# Patient Record
Sex: Male | Born: 1950 | Race: White | Hispanic: No | Smoking: Current every day smoker
Health system: Southern US, Community
[De-identification: ages and names within clinical notes are randomized; demographics above are authoritative.]

## PROBLEM LIST (undated history)

## (undated) DIAGNOSIS — I739 Peripheral vascular disease, unspecified: Secondary | ICD-10-CM

## (undated) DIAGNOSIS — I714 Abdominal aortic aneurysm, without rupture, unspecified: Secondary | ICD-10-CM

## (undated) DIAGNOSIS — Z720 Tobacco use: Secondary | ICD-10-CM

## (undated) DIAGNOSIS — I480 Paroxysmal atrial fibrillation: Secondary | ICD-10-CM

## (undated) DIAGNOSIS — I441 Atrioventricular block, second degree: Secondary | ICD-10-CM

## (undated) DIAGNOSIS — E669 Obesity, unspecified: Secondary | ICD-10-CM

## (undated) DIAGNOSIS — G4733 Obstructive sleep apnea (adult) (pediatric): Secondary | ICD-10-CM

## (undated) DIAGNOSIS — G9389 Other specified disorders of brain: Secondary | ICD-10-CM

## (undated) DIAGNOSIS — I10 Essential (primary) hypertension: Secondary | ICD-10-CM

## (undated) DIAGNOSIS — I499 Cardiac arrhythmia, unspecified: Secondary | ICD-10-CM

## (undated) DIAGNOSIS — I471 Supraventricular tachycardia: Secondary | ICD-10-CM

## (undated) HISTORY — DX: Abdominal aortic aneurysm, without rupture: I71.4

## (undated) HISTORY — DX: Peripheral vascular disease, unspecified: I73.9

## (undated) HISTORY — DX: Obesity, unspecified: E66.9

## (undated) HISTORY — DX: Essential (primary) hypertension: I10

## (undated) HISTORY — DX: Tobacco use: Z72.0

## (undated) HISTORY — DX: Abdominal aortic aneurysm, without rupture, unspecified: I71.40

## (undated) HISTORY — PX: BRAIN SURGERY: SHX531

## (undated) HISTORY — PX: BLADDER SURGERY: SHX569

## (undated) HISTORY — DX: Atrioventricular block, second degree: I44.1

## (undated) HISTORY — DX: Obstructive sleep apnea (adult) (pediatric): G47.33

---
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
## 2003-01-02 ENCOUNTER — Encounter: Payer: Self-pay | Admitting: Urology

## 2003-01-09 ENCOUNTER — Encounter (INDEPENDENT_AMBULATORY_CARE_PROVIDER_SITE_OTHER): Payer: Self-pay | Admitting: Specialist

## 2003-01-09 ENCOUNTER — Observation Stay (HOSPITAL_COMMUNITY): Admission: RE | Admit: 2003-01-09 | Discharge: 2003-01-10 | Payer: Self-pay | Admitting: Urology

## 2004-03-16 ENCOUNTER — Encounter (INDEPENDENT_AMBULATORY_CARE_PROVIDER_SITE_OTHER): Payer: Self-pay | Admitting: Specialist

## 2004-03-16 ENCOUNTER — Ambulatory Visit (HOSPITAL_BASED_OUTPATIENT_CLINIC_OR_DEPARTMENT_OTHER): Admission: RE | Admit: 2004-03-16 | Discharge: 2004-03-16 | Payer: Self-pay | Admitting: Urology

## 2004-03-16 ENCOUNTER — Ambulatory Visit (HOSPITAL_COMMUNITY): Admission: RE | Admit: 2004-03-16 | Discharge: 2004-03-16 | Payer: Self-pay | Admitting: Urology

## 2005-12-22 ENCOUNTER — Encounter (INDEPENDENT_AMBULATORY_CARE_PROVIDER_SITE_OTHER): Payer: Self-pay | Admitting: Specialist

## 2005-12-22 ENCOUNTER — Ambulatory Visit (HOSPITAL_BASED_OUTPATIENT_CLINIC_OR_DEPARTMENT_OTHER): Admission: RE | Admit: 2005-12-22 | Discharge: 2005-12-22 | Payer: Self-pay | Admitting: Urology

## 2006-09-12 ENCOUNTER — Encounter (INDEPENDENT_AMBULATORY_CARE_PROVIDER_SITE_OTHER): Payer: Self-pay | Admitting: Urology

## 2006-09-12 ENCOUNTER — Ambulatory Visit (HOSPITAL_BASED_OUTPATIENT_CLINIC_OR_DEPARTMENT_OTHER): Admission: RE | Admit: 2006-09-12 | Discharge: 2006-09-12 | Payer: Self-pay | Admitting: Urology

## 2006-12-12 ENCOUNTER — Ambulatory Visit (HOSPITAL_COMMUNITY): Admission: RE | Admit: 2006-12-12 | Discharge: 2006-12-12 | Payer: Self-pay | Admitting: Cardiology

## 2007-07-20 ENCOUNTER — Encounter (INDEPENDENT_AMBULATORY_CARE_PROVIDER_SITE_OTHER): Payer: Self-pay | Admitting: Urology

## 2007-07-20 ENCOUNTER — Ambulatory Visit (HOSPITAL_BASED_OUTPATIENT_CLINIC_OR_DEPARTMENT_OTHER): Admission: RE | Admit: 2007-07-20 | Discharge: 2007-07-20 | Payer: Self-pay | Admitting: Urology

## 2009-01-01 ENCOUNTER — Encounter: Payer: Self-pay | Admitting: Emergency Medicine

## 2009-01-01 ENCOUNTER — Inpatient Hospital Stay (HOSPITAL_COMMUNITY): Admission: AD | Admit: 2009-01-01 | Discharge: 2009-01-08 | Payer: Self-pay | Admitting: Neurosurgery

## 2009-02-20 ENCOUNTER — Encounter: Admission: RE | Admit: 2009-02-20 | Discharge: 2009-04-17 | Payer: Self-pay | Admitting: Family Medicine

## 2009-04-14 ENCOUNTER — Ambulatory Visit: Payer: Self-pay

## 2009-04-14 ENCOUNTER — Ambulatory Visit: Payer: Self-pay | Admitting: Cardiovascular Disease

## 2009-04-14 ENCOUNTER — Ambulatory Visit (HOSPITAL_COMMUNITY): Admission: RE | Admit: 2009-04-14 | Discharge: 2009-04-14 | Payer: Self-pay | Admitting: Neurology

## 2009-04-14 ENCOUNTER — Encounter (INDEPENDENT_AMBULATORY_CARE_PROVIDER_SITE_OTHER): Payer: Self-pay | Admitting: Neurology

## 2009-06-01 ENCOUNTER — Encounter: Admission: RE | Admit: 2009-06-01 | Discharge: 2009-06-01 | Payer: Self-pay | Admitting: Neurology

## 2010-01-08 ENCOUNTER — Inpatient Hospital Stay (HOSPITAL_COMMUNITY): Admission: EM | Admit: 2010-01-08 | Discharge: 2010-01-15 | Payer: Self-pay | Admitting: Emergency Medicine

## 2010-01-08 ENCOUNTER — Ambulatory Visit: Payer: Self-pay | Admitting: Internal Medicine

## 2010-01-11 ENCOUNTER — Ambulatory Visit: Payer: Self-pay | Admitting: Vascular Surgery

## 2010-01-11 ENCOUNTER — Encounter: Payer: Self-pay | Admitting: Pulmonary Disease

## 2010-01-12 ENCOUNTER — Ambulatory Visit: Payer: Self-pay | Admitting: Physical Medicine & Rehabilitation

## 2010-01-15 ENCOUNTER — Inpatient Hospital Stay (HOSPITAL_COMMUNITY)
Admission: RE | Admit: 2010-01-15 | Discharge: 2010-02-03 | Payer: Self-pay | Admitting: Physical Medicine & Rehabilitation

## 2010-01-15 ENCOUNTER — Ambulatory Visit: Payer: Self-pay | Admitting: Physical Medicine & Rehabilitation

## 2010-03-08 ENCOUNTER — Encounter
Admission: RE | Admit: 2010-03-08 | Discharge: 2010-05-18 | Payer: Self-pay | Source: Home / Self Care | Attending: Physical Medicine & Rehabilitation | Admitting: Physical Medicine & Rehabilitation

## 2010-03-16 ENCOUNTER — Ambulatory Visit: Payer: Self-pay | Admitting: Physical Medicine & Rehabilitation

## 2010-03-24 ENCOUNTER — Encounter
Admission: RE | Admit: 2010-03-24 | Discharge: 2010-03-24 | Payer: Self-pay | Source: Home / Self Care | Admitting: Neurosurgery

## 2010-04-13 HISTORY — PX: NM MYOCAR PERF WALL MOTION: HXRAD629

## 2010-04-13 HISTORY — PX: US ECHOCARDIOGRAPHY: HXRAD669

## 2010-05-03 ENCOUNTER — Ambulatory Visit (HOSPITAL_COMMUNITY)
Admission: RE | Admit: 2010-05-03 | Discharge: 2010-05-03 | Payer: Self-pay | Source: Home / Self Care | Attending: Cardiovascular Disease | Admitting: Cardiovascular Disease

## 2010-05-05 LAB — SURGICAL PCR SCREEN
MRSA, PCR: NEGATIVE
Staphylococcus aureus: NEGATIVE

## 2010-05-05 LAB — GLUCOSE, CAPILLARY
Glucose-Capillary: 123 mg/dL — ABNORMAL HIGH (ref 70–99)
Glucose-Capillary: 130 mg/dL — ABNORMAL HIGH (ref 70–99)

## 2010-05-13 NOTE — Op Note (Addendum)
  NAMEERNEST, POPOWSKI                 ACCOUNT NO.:  [id number]  MEDICAL RECORD NO.:  [id number]           PATIENT TYPE:  LOCATION:                                 FACILITY:  PHYSICIAN:  Thurmon Fair, MD     DATE OF BIRTH:  1950-11-10  DATE OF PROCEDURE: DATE OF DISCHARGE:                              OPERATIVE REPORT   PROCEDURE NOTE  PROCEDURE PERFORMED:  Implantation of a loop recorder.  REASON FOR THE PROCEDURE:  Unexplained syncope.  PROCEDURE PERFORMED BY:  Thurmon Fair, MD  ASSISTANT:  Hollace Hayward.  MEDICATIONS ADMINISTERED: 1. Ancef 1 g intravenously. 2. Lidocaine 1% 25 mL locally. 3. Versed 2 mg intravenously. 4. Fentanyl 25 mcg intravenously.  COMPLICATIONS:  None.  DEVICE DETAILS:  The loop recorder implanted was a Medtronic Reveal [id number], serial number [id number].  Mr. Dogan is a 60 year old man who has had recurrent unheralded syncopal events that have lead to serious physical injury.  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  After risks and benefits of the procedure were described, the patient provided informed consent.  He was brought to the cardiac cath lab in a fasting state and prepped and draped in the usual sterile fashion.  The optimal location for loop recording monitor placement had previously been ascertained by mapping to be in the fourth to fifth intercostal space approximately 3-4 cm lateral to the left sternal border.  Local anesthesia with 1% lidocaine was administered in this area.  Using a blade, a 4-cm horizontal incision was made.  Using electrocautery and blunt dissection, a small prepectoral pocket was created.  It was inspected for hemostasis and was found to be good.  The device was placed in the pocket which was then closed in three layers using two layers of 2-0 Vicryl,  one layer of 4-0 Vicryl.  Steri-Strips and a sterile dressing were then applied.  No immediate complications occurred.  ESTIMATED BLOOD LOSS:  Less than 5 mL.     Thurmon Fair, MD     MC/MEDQ  D:  05/03/2010  T:  05/04/2010  Job:  [id number]  cc:   Italy Hilty, MD  Electronically Signed by Thurmon Fair M.D. on 05/13/2010 12:17:58 PM

## 2010-06-30 LAB — GLUCOSE, CAPILLARY
Glucose-Capillary: 102 mg/dL — ABNORMAL HIGH (ref 70–99)
Glucose-Capillary: 105 mg/dL — ABNORMAL HIGH (ref 70–99)
Glucose-Capillary: 119 mg/dL — ABNORMAL HIGH (ref 70–99)
Glucose-Capillary: 122 mg/dL — ABNORMAL HIGH (ref 70–99)
Glucose-Capillary: 90 mg/dL (ref 70–99)
Glucose-Capillary: 94 mg/dL (ref 70–99)
Glucose-Capillary: 97 mg/dL (ref 70–99)
Glucose-Capillary: 99 mg/dL (ref 70–99)

## 2010-07-01 LAB — CK TOTAL AND CKMB (NOT AT ARMC)
CK, MB: 2.8 ng/mL (ref 0.3–4.0)
CK, MB: 3 ng/mL (ref 0.3–4.0)
Relative Index: 2 (ref 0.0–2.5)
Relative Index: 2.1 (ref 0.0–2.5)
Total CK: 135 U/L (ref 7–232)
Total CK: 148 U/L (ref 7–232)

## 2010-07-01 LAB — CBC
HCT: 31.1 % — ABNORMAL LOW (ref 39.0–52.0)
HCT: 32 % — ABNORMAL LOW (ref 39.0–52.0)
HCT: 32.4 % — ABNORMAL LOW (ref 39.0–52.0)
HCT: 33.3 % — ABNORMAL LOW (ref 39.0–52.0)
HCT: 42.8 % (ref 39.0–52.0)
Hemoglobin: 10.3 g/dL — ABNORMAL LOW (ref 13.0–17.0)
Hemoglobin: 10.6 g/dL — ABNORMAL LOW (ref 13.0–17.0)
Hemoglobin: 11 g/dL — ABNORMAL LOW (ref 13.0–17.0)
Hemoglobin: 11.2 g/dL — ABNORMAL LOW (ref 13.0–17.0)
Hemoglobin: 14.3 g/dL (ref 13.0–17.0)
MCH: 32.3 pg (ref 26.0–34.0)
MCH: 32.4 pg (ref 26.0–34.0)
MCH: 32.7 pg (ref 26.0–34.0)
MCH: 33.2 pg (ref 26.0–34.0)
MCHC: 32.5 g/dL (ref 30.0–36.0)
MCHC: 32.7 g/dL (ref 30.0–36.0)
MCHC: 33.2 g/dL (ref 30.0–36.0)
MCHC: 33.4 g/dL (ref 30.0–36.0)
MCHC: 33.7 g/dL (ref 30.0–36.0)
MCHC: 33.9 g/dL (ref 30.0–36.0)
MCHC: 34 g/dL (ref 30.0–36.0)
MCV: 95.8 fL (ref 78.0–100.0)
MCV: 97.1 fL (ref 78.0–100.0)
MCV: 97.8 fL (ref 78.0–100.0)
MCV: 99.3 fL (ref 78.0–100.0)
Platelets: 177 10*3/uL (ref 150–400)
Platelets: 179 10*3/uL (ref 150–400)
Platelets: 186 10*3/uL (ref 150–400)
RBC: 3.18 MIL/uL — ABNORMAL LOW (ref 4.22–5.81)
RBC: 3.47 MIL/uL — ABNORMAL LOW (ref 4.22–5.81)
RDW: 13 % (ref 11.5–15.5)
RDW: 13.3 % (ref 11.5–15.5)
RDW: 13.3 % (ref 11.5–15.5)
RDW: 13.8 % (ref 11.5–15.5)
RDW: 14 % (ref 11.5–15.5)
WBC: 14.2 10*3/uL — ABNORMAL HIGH (ref 4.0–10.5)
WBC: 7.2 10*3/uL (ref 4.0–10.5)
WBC: 8.6 10*3/uL (ref 4.0–10.5)

## 2010-07-01 LAB — GLUCOSE, CAPILLARY
Glucose-Capillary: 105 mg/dL — ABNORMAL HIGH (ref 70–99)
Glucose-Capillary: 105 mg/dL — ABNORMAL HIGH (ref 70–99)
Glucose-Capillary: 107 mg/dL — ABNORMAL HIGH (ref 70–99)
Glucose-Capillary: 112 mg/dL — ABNORMAL HIGH (ref 70–99)
Glucose-Capillary: 113 mg/dL — ABNORMAL HIGH (ref 70–99)
Glucose-Capillary: 114 mg/dL — ABNORMAL HIGH (ref 70–99)
Glucose-Capillary: 114 mg/dL — ABNORMAL HIGH (ref 70–99)
Glucose-Capillary: 116 mg/dL — ABNORMAL HIGH (ref 70–99)
Glucose-Capillary: 118 mg/dL — ABNORMAL HIGH (ref 70–99)
Glucose-Capillary: 125 mg/dL — ABNORMAL HIGH (ref 70–99)
Glucose-Capillary: 126 mg/dL — ABNORMAL HIGH (ref 70–99)
Glucose-Capillary: 128 mg/dL — ABNORMAL HIGH (ref 70–99)
Glucose-Capillary: 130 mg/dL — ABNORMAL HIGH (ref 70–99)
Glucose-Capillary: 131 mg/dL — ABNORMAL HIGH (ref 70–99)
Glucose-Capillary: 131 mg/dL — ABNORMAL HIGH (ref 70–99)
Glucose-Capillary: 140 mg/dL — ABNORMAL HIGH (ref 70–99)
Glucose-Capillary: 140 mg/dL — ABNORMAL HIGH (ref 70–99)
Glucose-Capillary: 141 mg/dL — ABNORMAL HIGH (ref 70–99)
Glucose-Capillary: 144 mg/dL — ABNORMAL HIGH (ref 70–99)
Glucose-Capillary: 144 mg/dL — ABNORMAL HIGH (ref 70–99)
Glucose-Capillary: 145 mg/dL — ABNORMAL HIGH (ref 70–99)
Glucose-Capillary: 145 mg/dL — ABNORMAL HIGH (ref 70–99)
Glucose-Capillary: 146 mg/dL — ABNORMAL HIGH (ref 70–99)
Glucose-Capillary: 147 mg/dL — ABNORMAL HIGH (ref 70–99)
Glucose-Capillary: 148 mg/dL — ABNORMAL HIGH (ref 70–99)
Glucose-Capillary: 148 mg/dL — ABNORMAL HIGH (ref 70–99)
Glucose-Capillary: 155 mg/dL — ABNORMAL HIGH (ref 70–99)
Glucose-Capillary: 158 mg/dL — ABNORMAL HIGH (ref 70–99)
Glucose-Capillary: 160 mg/dL — ABNORMAL HIGH (ref 70–99)
Glucose-Capillary: 160 mg/dL — ABNORMAL HIGH (ref 70–99)
Glucose-Capillary: 162 mg/dL — ABNORMAL HIGH (ref 70–99)
Glucose-Capillary: 163 mg/dL — ABNORMAL HIGH (ref 70–99)
Glucose-Capillary: 163 mg/dL — ABNORMAL HIGH (ref 70–99)
Glucose-Capillary: 165 mg/dL — ABNORMAL HIGH (ref 70–99)
Glucose-Capillary: 173 mg/dL — ABNORMAL HIGH (ref 70–99)
Glucose-Capillary: 176 mg/dL — ABNORMAL HIGH (ref 70–99)
Glucose-Capillary: 184 mg/dL — ABNORMAL HIGH (ref 70–99)
Glucose-Capillary: 194 mg/dL — ABNORMAL HIGH (ref 70–99)
Glucose-Capillary: 195 mg/dL — ABNORMAL HIGH (ref 70–99)
Glucose-Capillary: 209 mg/dL — ABNORMAL HIGH (ref 70–99)
Glucose-Capillary: 73 mg/dL (ref 70–99)
Glucose-Capillary: 79 mg/dL (ref 70–99)
Glucose-Capillary: 85 mg/dL (ref 70–99)
Glucose-Capillary: 89 mg/dL (ref 70–99)
Glucose-Capillary: 97 mg/dL (ref 70–99)
Glucose-Capillary: 99 mg/dL (ref 70–99)

## 2010-07-01 LAB — POCT I-STAT 7, (LYTES, BLD GAS, ICA,H+H)
Bicarbonate: 28.5 mEq/L — ABNORMAL HIGH (ref 20.0–24.0)
Calcium, Ion: 1.05 mmol/L — ABNORMAL LOW (ref 1.12–1.32)
Calcium, Ion: 1.14 mmol/L (ref 1.12–1.32)
HCT: 34 % — ABNORMAL LOW (ref 39.0–52.0)
HCT: 41 % (ref 39.0–52.0)
Hemoglobin: 11.6 g/dL — ABNORMAL LOW (ref 13.0–17.0)
Hemoglobin: 13.9 g/dL (ref 13.0–17.0)
Patient temperature: 35.5
Potassium: 4.8 mEq/L (ref 3.5–5.1)
Sodium: 137 mEq/L (ref 135–145)
pCO2 arterial: 36.8 mmHg (ref 35.0–45.0)
pCO2 arterial: 45.1 mmHg — ABNORMAL HIGH (ref 35.0–45.0)
pH, Arterial: 7.465 — ABNORMAL HIGH (ref 7.350–7.450)
pO2, Arterial: 454 mmHg — ABNORMAL HIGH (ref 80.0–100.0)

## 2010-07-01 LAB — BLOOD GAS, ARTERIAL
Bicarbonate: 25.3 mEq/L — ABNORMAL HIGH (ref 20.0–24.0)
Bicarbonate: 28.8 mEq/L — ABNORMAL HIGH (ref 20.0–24.0)
PEEP: 5 cmH2O
PEEP: 5 cmH2O
Patient temperature: 98.6
TCO2: 26.9 mmol/L (ref 0–100)
TCO2: 30.4 mmol/L (ref 0–100)
pCO2 arterial: 51.8 mmHg — ABNORMAL HIGH (ref 35.0–45.0)
pCO2 arterial: 53.2 mmHg — ABNORMAL HIGH (ref 35.0–45.0)
pH, Arterial: 7.309 — ABNORMAL LOW (ref 7.350–7.450)
pH, Arterial: 7.352 (ref 7.350–7.450)
pO2, Arterial: 81.2 mmHg (ref 80.0–100.0)
pO2, Arterial: 89.9 mmHg (ref 80.0–100.0)

## 2010-07-01 LAB — URINE DRUGS OF ABUSE SCREEN W ALC, ROUTINE (REF LAB)
Barbiturate Quant, Ur: NEGATIVE
Cocaine Metabolites: NEGATIVE
Creatinine,U: 56.7 mg/dL
Methadone: NEGATIVE
Phencyclidine (PCP): NEGATIVE
Propoxyphene: NEGATIVE

## 2010-07-01 LAB — COMPREHENSIVE METABOLIC PANEL
ALT: 34 U/L (ref 0–53)
ALT: 53 U/L (ref 0–53)
AST: 29 U/L (ref 0–37)
AST: 34 U/L (ref 0–37)
AST: 45 U/L — ABNORMAL HIGH (ref 0–37)
Albumin: 3 g/dL — ABNORMAL LOW (ref 3.5–5.2)
Albumin: 3.1 g/dL — ABNORMAL LOW (ref 3.5–5.2)
Alkaline Phosphatase: 36 U/L — ABNORMAL LOW (ref 39–117)
BUN: 11 mg/dL (ref 6–23)
BUN: 18 mg/dL (ref 6–23)
CO2: 23 mEq/L (ref 19–32)
CO2: 27 mEq/L (ref 19–32)
CO2: 29 mEq/L (ref 19–32)
Calcium: 7.8 mg/dL — ABNORMAL LOW (ref 8.4–10.5)
Calcium: 8.5 mg/dL (ref 8.4–10.5)
Calcium: 8.9 mg/dL (ref 8.4–10.5)
Calcium: 9.2 mg/dL (ref 8.4–10.5)
Chloride: 104 mEq/L (ref 96–112)
Creatinine, Ser: 0.79 mg/dL (ref 0.4–1.5)
Creatinine, Ser: 0.91 mg/dL (ref 0.4–1.5)
Creatinine, Ser: 0.93 mg/dL (ref 0.4–1.5)
Creatinine, Ser: 1.15 mg/dL (ref 0.4–1.5)
GFR calc Af Amer: >60 mL/min (ref >60)
GFR calc Af Amer: >60 mL/min (ref >60)
GFR calc non Af Amer: >60 mL/min (ref >60)
GFR calc non Af Amer: >60 mL/min (ref >60)
GFR calc non Af Amer: >60 mL/min (ref >60)
GFR calc non Af Amer: >60 mL/min (ref >60)
Glucose, Bld: 134 mg/dL — ABNORMAL HIGH (ref 70–99)
Glucose, Bld: 158 mg/dL — ABNORMAL HIGH (ref 70–99)
Sodium: 134 mEq/L — ABNORMAL LOW (ref 135–145)
Sodium: 138 mEq/L (ref 135–145)
Sodium: 142 mEq/L (ref 135–145)
Total Bilirubin: 0.5 mg/dL (ref 0.3–1.2)
Total Bilirubin: 0.7 mg/dL (ref 0.3–1.2)
Total Protein: 6.1 g/dL (ref 6.0–8.3)
Total Protein: 7.5 g/dL (ref 6.0–8.3)
Total Protein: 7.6 g/dL (ref 6.0–8.3)

## 2010-07-01 LAB — URINALYSIS, MICROSCOPIC ONLY
Glucose, UA: NEGATIVE mg/dL
Nitrite: NEGATIVE
Protein, ur: 100 mg/dL — AB
Specific Gravity, Urine: 1.023 (ref 1.005–1.030)
Urobilinogen, UA: 1 mg/dL (ref 0.0–1.0)
pH: 5 (ref 5.0–8.0)

## 2010-07-01 LAB — DIFFERENTIAL
Basophils Relative: 1 % (ref 0–1)
Eosinophils Absolute: 0.2 10*3/uL (ref 0.0–0.7)
Eosinophils Absolute: 0.5 10*3/uL (ref 0.0–0.7)
Eosinophils Relative: 2 % (ref 0–5)
Lymphocytes Relative: 16 % (ref 12–46)
Lymphs Abs: 2 10*3/uL (ref 0.7–4.0)
Lymphs Abs: 4.8 10*3/uL — ABNORMAL HIGH (ref 0.7–4.0)
Monocytes Absolute: 1.2 10*3/uL — ABNORMAL HIGH (ref 0.1–1.0)
Monocytes Relative: 9 % (ref 3–12)
Monocytes Relative: 9 % (ref 3–12)
Neutrophils Relative %: 54 % (ref 43–77)
Neutrophils Relative %: 71 % (ref 43–77)

## 2010-07-01 LAB — BENZODIAZEPINE, QUANTITATIVE, URINE
Alprazolam (GC/LC/MS), ur confirm: NEGATIVE NG/ML
Flurazepam GC/MS Conf: NEGATIVE NG/ML
Lorazepam UR QT: NEGATIVE NG/ML
Nordiazepam GC/MS Conf: NEGATIVE NG/ML
Oxazepam GC/MS Conf: NEGATIVE NG/ML

## 2010-07-01 LAB — URINALYSIS, ROUTINE W REFLEX MICROSCOPIC
Hgb urine dipstick: NEGATIVE
Ketones, ur: NEGATIVE mg/dL
Protein, ur: NEGATIVE mg/dL
Urobilinogen, UA: 0.2 mg/dL (ref 0.0–1.0)

## 2010-07-01 LAB — BASIC METABOLIC PANEL
BUN: 12 mg/dL (ref 6–23)
BUN: 13 mg/dL (ref 6–23)
CO2: 25 mEq/L (ref 19–32)
Calcium: 8.2 mg/dL — ABNORMAL LOW (ref 8.4–10.5)
Chloride: 101 mEq/L (ref 96–112)
Chloride: 105 mEq/L (ref 96–112)
Chloride: 106 mEq/L (ref 96–112)
Creatinine, Ser: 0.88 mg/dL (ref 0.4–1.5)
GFR calc Af Amer: >60 mL/min (ref >60)
GFR calc non Af Amer: >60 mL/min (ref >60)
Glucose, Bld: 140 mg/dL — ABNORMAL HIGH (ref 70–99)
Glucose, Bld: 147 mg/dL — ABNORMAL HIGH (ref 70–99)
Glucose, Bld: 97 mg/dL (ref 70–99)
Potassium: 3.4 mEq/L — ABNORMAL LOW (ref 3.5–5.1)
Potassium: 3.5 mEq/L (ref 3.5–5.1)
Potassium: 4 mEq/L (ref 3.5–5.1)
Sodium: 141 mEq/L (ref 135–145)

## 2010-07-01 LAB — POCT I-STAT, CHEM 8
Chloride: 107 mEq/L (ref 96–112)
Glucose, Bld: 155 mg/dL — ABNORMAL HIGH (ref 70–99)
HCT: 45 % (ref 39.0–52.0)
Hemoglobin: 15.3 g/dL (ref 13.0–17.0)
Potassium: 4.1 mEq/L (ref 3.5–5.1)

## 2010-07-01 LAB — POCT CARDIAC MARKERS
CKMB, poc: 1 ng/mL (ref 1.0–8.0)
Troponin i, poc: <0.05 ng/mL (ref 0.00–0.09)

## 2010-07-01 LAB — POCT I-STAT 3, ART BLOOD GAS (G3+)
Acid-Base Excess: 1 mmol/L (ref 0.0–2.0)
Bicarbonate: 29.2 mEq/L — ABNORMAL HIGH (ref 20.0–24.0)
O2 Saturation: 100 %
pCO2 arterial: 61.3 mmHg (ref 35.0–45.0)
pO2, Arterial: 372 mmHg — ABNORMAL HIGH (ref 80.0–100.0)

## 2010-07-01 LAB — URINE MICROSCOPIC-ADD ON

## 2010-07-01 LAB — URINE CULTURE
Colony Count: 5000
Culture  Setup Time: 201110050459
Culture  Setup Time: 201110091201
Special Requests: POSITIVE

## 2010-07-01 LAB — ABO/RH: ABO/RH(D): O POS

## 2010-07-01 LAB — PROTIME-INR
INR: 1.03 (ref 0.00–1.49)
Prothrombin Time: 13.7 seconds (ref 11.6–15.2)

## 2010-07-01 LAB — MAGNESIUM: Magnesium: 1.7 mg/dL (ref 1.5–2.5)

## 2010-07-01 LAB — PHOSPHORUS: Phosphorus: 3.9 mg/dL (ref 2.3–4.6)

## 2010-07-01 LAB — TYPE AND SCREEN

## 2010-07-23 LAB — BASIC METABOLIC PANEL
BUN: 14 mg/dL (ref 6–23)
Chloride: 103 mEq/L (ref 96–112)
GFR calc non Af Amer: >60 mL/min (ref >60)
Potassium: 4.4 mEq/L (ref 3.5–5.1)
Sodium: 139 mEq/L (ref 135–145)

## 2010-07-23 LAB — GLUCOSE, CAPILLARY
Glucose-Capillary: 110 mg/dL — ABNORMAL HIGH (ref 70–99)
Glucose-Capillary: 112 mg/dL — ABNORMAL HIGH (ref 70–99)
Glucose-Capillary: 114 mg/dL — ABNORMAL HIGH (ref 70–99)
Glucose-Capillary: 114 mg/dL — ABNORMAL HIGH (ref 70–99)
Glucose-Capillary: 118 mg/dL — ABNORMAL HIGH (ref 70–99)
Glucose-Capillary: 119 mg/dL — ABNORMAL HIGH (ref 70–99)
Glucose-Capillary: 126 mg/dL — ABNORMAL HIGH (ref 70–99)
Glucose-Capillary: 128 mg/dL — ABNORMAL HIGH (ref 70–99)
Glucose-Capillary: 129 mg/dL — ABNORMAL HIGH (ref 70–99)
Glucose-Capillary: 130 mg/dL — ABNORMAL HIGH (ref 70–99)
Glucose-Capillary: 130 mg/dL — ABNORMAL HIGH (ref 70–99)
Glucose-Capillary: 135 mg/dL — ABNORMAL HIGH (ref 70–99)
Glucose-Capillary: 147 mg/dL — ABNORMAL HIGH (ref 70–99)
Glucose-Capillary: 64 mg/dL — ABNORMAL LOW (ref 70–99)
Glucose-Capillary: 88 mg/dL (ref 70–99)
Glucose-Capillary: 89 mg/dL (ref 70–99)
Glucose-Capillary: 92 mg/dL (ref 70–99)
Glucose-Capillary: 99 mg/dL (ref 70–99)

## 2010-07-23 LAB — DIFFERENTIAL
Eosinophils Absolute: 0.1 10*3/uL (ref 0.0–0.7)
Eosinophils Relative: 1 % (ref 0–5)
Lymphocytes Relative: 15 % (ref 12–46)
Lymphs Abs: 1.4 10*3/uL (ref 0.7–4.0)
Monocytes Absolute: 0.8 10*3/uL (ref 0.1–1.0)
Monocytes Relative: 9 % (ref 3–12)

## 2010-07-23 LAB — CBC
HCT: 41.3 % (ref 39.0–52.0)
HCT: 42.3 % (ref 39.0–52.0)
Hemoglobin: 14.3 g/dL (ref 13.0–17.0)
MCV: 98.2 fL (ref 78.0–100.0)
MCV: 98.4 fL (ref 78.0–100.0)
Platelets: 198 10*3/uL (ref 150–400)
Platelets: 205 10*3/uL (ref 150–400)
RBC: 4.21 MIL/uL — ABNORMAL LOW (ref 4.22–5.81)
RBC: 4.3 MIL/uL (ref 4.22–5.81)
WBC: 9.7 10*3/uL (ref 4.0–10.5)
WBC: 12.8 10*3/uL — ABNORMAL HIGH (ref 4.0–10.5)

## 2010-07-23 LAB — COMPREHENSIVE METABOLIC PANEL
AST: 34 U/L (ref 0–37)
Albumin: 4 g/dL (ref 3.5–5.2)
Alkaline Phosphatase: 53 U/L (ref 39–117)
BUN: 10 mg/dL (ref 6–23)
CO2: 28 mEq/L (ref 19–32)
Chloride: 104 mEq/L (ref 96–112)
GFR calc non Af Amer: >60 mL/min (ref >60)
Potassium: 4.3 mEq/L (ref 3.5–5.1)
Total Bilirubin: 0.8 mg/dL (ref 0.3–1.2)

## 2010-07-23 LAB — URINALYSIS, ROUTINE W REFLEX MICROSCOPIC
Glucose, UA: NEGATIVE mg/dL
Hgb urine dipstick: NEGATIVE
Protein, ur: NEGATIVE mg/dL
Specific Gravity, Urine: 1.024 (ref 1.005–1.030)
Urobilinogen, UA: 0.2 mg/dL (ref 0.0–1.0)

## 2010-07-23 LAB — PROTIME-INR
INR: 1.1 (ref 0.00–1.49)
Prothrombin Time: 13.9 seconds (ref 11.6–15.2)

## 2010-07-23 LAB — RAPID URINE DRUG SCREEN, HOSP PERFORMED
Amphetamines: NOT DETECTED
Opiates: NOT DETECTED
Tetrahydrocannabinol: NOT DETECTED

## 2010-07-23 LAB — URINE MICROSCOPIC-ADD ON

## 2010-08-31 NOTE — Op Note (Signed)
NAMESTARLING, Dustin                 ACCOUNT NO.:  [id number]   MEDICAL RECORD NO.:  [id number]          PATIENT TYPE:  AMB   LOCATION:  SDS                          FACILITY:  [facility]   PHYSICIAN:  Richard A. Alanda Amass, M.D.DATE OF BIRTH:  29-May-1950   DATE OF PROCEDURE:  12/12/2006  DATE OF DISCHARGE:  12/12/2006                               OPERATIVE REPORT   PROCEDURE:  Retrograde abdominal aortic catheterization, abdominal  aortic angiogram midstream PA projection, bilateral iliac angiogram PA  projection, LLE runoff using DSA with step-table imaging, RLE runoff  using DSA and step-table imaging, right femoral angiogram hand  injection, right common femoral artery closure with nitinol clip  Starclose device successful.   OPERATING PHYSICIAN:  Richard A. Alanda Amass, M.D.   PROCTORING PHYSICIAN:  Cristy Hilts. Jacinto Halim, M.D.   BRIEF HISTORY:  Please refer to H&P.   This 61 year old, divorced, white gentleman who lives with his daughter  and still works as Writer was brought into same-day admission for  peripheral angiography.  He has a history of bilateral hip discomfort  felt to be compatible with possible claudication.  He has risk factors  of exogenous obesity and smoking which he recently quit, hyperlipidemia,  systemic hypertension, sleep apnea and asymptomatic right bundle branch  block.  He has been seen by and evaluated by Dr. Jacinto Halim.  Cardiolite  August 15, 2006 was negative for ischemia with normal EF.  A 2-D echo of  April 2008 showed no significant valvular disease, diastolic relaxation  abnormality that was mild, normal systolic function.  No significant  valvular disease.   Outpatient Dopplers showed increased velocities in the common iliacs  bilaterally and mild increased velocities in the mid SFA, proximal-mid  SFA bilaterally with RABI 0.96 and LABI 0.93.  Informed consent was  obtained from the patient to proceed with diagnostic peripheral  angiography along with  consent for video and audio taping.  His ACE  inhibitor was held on the day of the procedure.  Preoperative laboratory  showed an H&H of 13.2/37, glucose 182 with recently diagnosed diabetes,  potassium 5.1 with normal BUN and creatinine of 18/1.3 and normal coags.  ISTAT done after arterial access showed a potassium of 3.9.   The right groin was prepped and draped in the usual manner.  One percent  Xylocaine was used for local anesthesia, and the patient was given 2 mg  of Versed IV for sedation.  He was premedicated with 5 mg p.o. on call  to laboratory.  The RCFA was difficult to palpate and was entered with  an anterior puncture using an 18 thin-wall Smart needle ultrasound-  guided.  A Wholey wire was then used to traverse the iliac system, and a  5-French short side-arm sheath was inserted without difficulty.  A 5-  French pigtail catheter was advanced over the [facility] wire above the  level of renal arteries, and abdominal angiogram was done in the  midstream PA projection at 20 mL, 20 mL per second with DSA.  A second  injection was done above the iliac bifurcation at the same settings.  We  then accessed the left iliac system, removing the pigtail catheter,  using the [facility] wire and a 5-French IMA catheter with the wire advanced  into LEIA.  An end-hole 5-French catheter was then advanced into the  LEIA, and left lower extremity angiography was done at 35 mL, 5 mL per  second using DSA and step-table imaging with visualization to the foot.  Pullback gradient across the LCIA into the distal abdominal aorta and  across the [facility] showed no significant gradient.  Arterial pressures  were monitored throughout the procedure and ranged 120-130 mmHg with the  patient in sinus rhythm and right bundle branch block.  RLE runoff was  done through the side-arm sheath similarly with a Medtronic power  injector at 35 mL, 5 mL per second with visualization to the right foot.  The patient  tolerated the diagnostic procedure well.  A right common  femoral angiogram was done by hand showing good puncture into the RCFA  with the sheath in place.  The right femoral arteriotomy was then closed  with a Perclose nitinol clip device successfully under sterile  conditions.  The patient was transferred to the holding area for  postoperative care in stable condition.  He tolerated the procedure  well.   ANGIOGRAMS:  Abdominal angiogram:  The SMA and celiac axis were intact  and appeared normal.  The renal arteries were single smooth and normal  bilaterally.   The infrarenal abdominal aorta in distal third to half showed mild  aneurysmal dilatation with atherosclerotic disease but no obstruction  and minimal calcification.   The proximal common iliacs bilaterally had calcific atherosclerotic  diffuse disease.  There was mild aneurysmal dilatation of the LCIA and  50% narrowing focally of the proximal LCIA.  There was 20-30% narrowing  of the RCIA.  The distal common iliacs had no significant disease.  The  hypogastrics were intact bilaterally.   The external iliacs were widely patent, margin smooth with no  significant disease.   The SFA profunda junctions were normal bilaterally.   The left SFA had 40% segmental regularity in the proximal third and a 40-  50% smooth narrowing in the midportion.  Beyond that, the left popliteal  was widely patent and large, and there was good three-vessel runoff of  the left foot.   The right SFA had tandem 30% proximal and 40%, 50% tandem mid lesions  with mild calcification.  There was good runoff bilaterally.  The  popliteal had no significant disease on the right, and there was good  three-vessel runoff of the RLE.   This patient has moderate bilateral SFA disease and moderate calcific  proximal common iliac disease bilaterally.  There is dilatation of the  distal abdominal aorta, and I would recommend follow-up abdominal   ultrasound.   In view of his risk factors and noncritical peripheral arterial disease  at present, I would recommend continued medical therapy with careful  control of lipids, AODM.  Associated weight reduction and exercise  program may be helpful.  The etiology of his hip discomfort is not clear  from this study, and there may be a component of arthritis, particularly  with his exogenous obesity.   CATHETERIZATION DIAGNOSES:  1. Bilateral hip discomfort, etiology not determined.  2. Left common iliac and mild right common iliac proximal narrowing      with aneurysmal LCIA dilatation.  3. Distal abdominal aortic dilatation and atherosclerotic disease      without obstruction.  4. Bilateral  moderate diffuse mid SFA disease three-vessel runoff      bilaterally.  5. Adult-onset diabetes mellitus.  6. Exogenous obesity.  7. Sleep apnea on sleep CPAP.  8. Hypertension.  9. Hyperlipidemia.  10.Recent cigarette smoking, quit.      Richard A. Alanda Amass, M.D.  Electronically Signed     RAW/MEDQ  D:  12/12/2006  T:  12/13/2006  Job:  [id number]   cc:   Cristy Hilts. Jacinto Halim, MD  Elizabeth Palau, M.D.  Richard A. Alanda Amass, M.D.  [facility]

## 2010-08-31 NOTE — Op Note (Signed)
NAMEASUNCION, SHIBATA                 ACCOUNT NO.:  [id number]   MEDICAL RECORD NO.:  [id number]          PATIENT TYPE:  AMB   LOCATION:  [facility]                         FACILITY:  [facility]   PHYSICIAN:  Heloise Purpura, MD      DATE OF BIRTH:  1950/10/30   DATE OF PROCEDURE:  07/20/2007  DATE OF DISCHARGE:                               OPERATIVE REPORT   PREOPERATIVE DIAGNOSIS:  Bladder cancer.   POSTOPERATIVE DIAGNOSIS:  Bladder cancer.   PROCEDURES:  1. Cystoscopy.  2. Bilateral retrograde pyelography.  3. Bladder biopsy and fulguration.   SURGEON:  Heloise Purpura, MD   ANESTHESIA:  General.   COMPLICATIONS:  None.   ESTIMATED BLOOD LOSS:  Minimal.   SPECIMENS:  Left bladder biopsies.   DISPOSITION OF SPECIMEN:  To pathology.   INTRAOPERATIVE FINDINGS:  The patient was noted to have an area of  erythema overlying the left lateral aspect of the bladder just lateral  to the left ureteral orifice.  This appearance was concerning for  carcinoma in situ.   INDICATION:  Mr. 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  He has been undergoing  surveillance with his last resection 1 year ago.  He was found to have  findings on office cystoscopy concerning for possible carcinoma in situ.  Urine cytology, however, was negative.  After discussing these findings,  it was decided to proceed with the above procedures.  Potential risks,  complications, and alternative options were discussed with the patient  in detail.  Informed consent was obtained.   DESCRIPTION OF PROCEDURE:  The patient was taken to the operating room  and general anesthetic was administered.  He was given preoperative  antibiotics, placed in the dorsal lithotomy position and prepped and  draped in the usual sterile fashion.  Next preoperative time out was  performed.  Cystourethroscopy was then performed which demonstrated a  normal anterior urethra.  The patient  was noted to have a mildly  narrowed bulbar urethral stricture as previously noted.  However, the 22-  French cystoscope sheath was able to be passed into the bladder without  significant difficulty.  The bladder was then examined in its entirety.  This again revealed the erythematous areas on the left side of the  bladder as mentioned above.  No other bladder tumors, stones, or other  mucosal pathology was identified.  The right ureteral orifice was then  identified and intubated with a cone-tipped catheter.  Contrast was  injected which demonstrated no evidence of any filling defects or  dilation in the renal collecting system or ureter.  An identical  procedure was then performed on the left side with again no filling  defects or abnormalities noted in the renal collecting system or ureter.  At this point the cold cup biopsy forceps were used to take multiple  biopsies of the erythematous areas along the let side of the bladder.  These were fulgurated with the Bugbee electrode until adequate  hemostasis was achieved.  The bladder was emptied and reinspected, and  hemostasis appeared excellent.  The patients' bladder was then emptied,  and the procedure was ended.   He tolerated the procedure well and without complications.      Heloise Purpura, MD  Electronically Signed     LB/MEDQ  D:  07/20/2007  T:  07/20/2007  Job:  [id number]

## 2010-08-31 NOTE — Op Note (Signed)
Dustin Estes, Dustin Estes                 ACCOUNT NO.:  [id number]   MEDICAL RECORD NO.:  [id number]          PATIENT TYPE:  AMB   LOCATION:  [facility]                         FACILITY:  [facility]   PHYSICIAN:  Heloise Purpura, MD      DATE OF BIRTH:  [birth year]   DATE OF PROCEDURE:  09/12/2006  DATE OF DISCHARGE:                               OPERATIVE REPORT   PREOPERATIVE DIAGNOSIS:  Bladder cancer.   POSTOPERATIVE DIAGNOSES:  1. Urethral stricture.  2. Bladder cancer.   PROCEDURE:  1. Cystoscopy.  2. Dilation of urethral stricture.  3. Transurethral resection of bladder tumor (less than 2 cm).  4. Right ureteral stent placement (6 x 24).   SURGEON:  Heloise Purpura, M.D.   ANESTHESIA:  General.   COMPLICATIONS:  None.   INDICATIONS:  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  He was  recently noted on surveillance cystoscopy to have a recurrent tumor in  the vicinity of the right ureteral orifice.  He did undergo a CT scan  with delayed images of the urinary tract which did not demonstrate any  upper tract disease.  He was then counseled regarding his options and  after discussing the risks and alternatives he chose to proceed with  transurethral resection of this bladder tumor with the understanding  that he likely would require resection of the right ureteral orifice and  placement of a ureteral stent.  Informed consent was obtained.   DESCRIPTION OF PROCEDURE:  The patient was taken to the operating room  and a general anesthetic was administered.  He was given preoperative  antibiotics, placed in the dorsal lithotomy position, and prepped and  draped in the usual sterile fashion.  Next a preoperative time-out was  performed.  Cystourethroscopy was then performed with  the 22-French  cystoscope sheath.  This did demonstrate a little narrowing in the  anterior urethra although the 22-French cystoscope sheath was  able to be  passed through this area and into the bladder.  The posterior urethra  appeared normal.  Examination of the bladder demonstrated the  aforementioned papillary tumor overlying the right ureteral orifice.  The remainder of the bladder demonstrated the left ureteral orifice to  be in the normal anatomic position and effluxing clear urine.  There  were no other bladder tumors, stones, or other mucosal pathology  identified.  An attempt was then made to pass the 26-French resectoscope  sheath through the urethra and into the bladder.  However, this was  unable to be performed due to the aforementioned urethral stricture.  Therefore, attempts at urethral dilation were made with a Sissy Hoff  sounds which was unsuccessful.  A 0.038 guidewire was then passed into  the bladder via the cystoscope sheath and an attempt was made to dilate  the urethral stricture with the [facility] dilators.  This allowed dilation  up to 26-French.  However, the 28-French [facility] dilator was unable to be  passed through the stricture.  The 30-French Ultraxx balloon dilator was  then  used to dilate the stricture.  All waste was seen to be removed  from the balloon.  This was kept inflated for 3 minutes.  The balloon  was then deflated and attempt again was made to pass the resectoscope  which was again unsuccessful.  At this point the outer sheath of the  resectoscope was removed and the 22-French resectoscope inner sheath was  able to be passed under direct vision through the stricture and into the  bladder with minimal difficulty.  Although continuous flow was not able  to be used, due to the small size of the tumor, it was able to be  resected easily with the cutting loop.  The right ureteral orifice was  also resected in order to obtain an adequate resection.  The cystoscope  was then replaced and the right ureteral orifice was examined.  The wire  was able to be passed into the distal right ureter, advanced  up into the  right renal pelvis under fluoroscopic guidance.  A 6 x 24 double-J  ureteral stent was then advanced over the wire using Seldinger technique  with a good curl noted in the renal pelvis as well as in the bladder  after the wire was removed.  The 24-French inner sheath of the  resectoscope was then replaced and the cautery loop was used to  coagulate the aforementioned resection site.  The resection loop was  then removed with the inner sheath remaining in the bladder.  A wire was  then passed through the inner sheath into the bladder and a 16-French  Council tip was passed over the wire and into the bladder.  The  procedure was ended.  The patient appeared to tolerate the procedure  well and without complications.  He was able to be extubated and  transferred to the recovery unit in satisfactory condition.           ______________________________  Heloise Purpura, MD  Electronically Signed     LB/MEDQ  D:  09/12/2006  T:  09/12/2006  Job:  [id number]

## 2010-09-03 NOTE — Discharge Summary (Signed)
NAMEMONTRAE, BRAITHWAITE NO.:  [id number]   MEDICAL RECORD NO.:  [id number]                   PATIENT TYPE:  OBV   LOCATION:  0350                                 FACILITY:  [facility]   PHYSICIAN:  Claudette Laws, M.D.               DATE OF BIRTH:  1951-04-08   DATE OF ADMISSION:  01/09/2003  DATE OF DISCHARGE:  01/10/2003                                 DISCHARGE SUMMARY   HISTORY:  This is a 60 year old patient admitted with a large papillary  bladder tumor.  Recently he was noted to have an episode of painless gross  hematuria.  He was worked up in the office and found to have a papillary  lesion along the right lateral wall surrounding the right ureteral orifice.  The patient otherwise is in good health except for being a heavy cigarette  smoker.   MEDICATIONS:  He takes Advicor 1000 mg/20 mg a day, also a cholesterol  medication.   PHYSICAL EXAMINATION:  Unremarkable.  His BP was 124/70.  His prostate felt  benign.   LABORATORY DATA:  Electrolytes were normal with a BUN of 11, a creatinine of  1.  His hemoglobin was 14.5, hematocrit 41.  His liver enzymes were normal.  His chest x-ray showed no acute pulmonary disease.  EKG showed normal sinus  rhythm.   HOSPITAL COURSE:  The patient came in as an outpatient on January 09, 2003, underwent a rather extensive transurethral resection of his bladder  tumor.  Postop, we observed him overnight with a Foley catheter.  He had a  stable night.  By the next morning, the urine was clear, he was comfortable.  I would like to keep the catheter in for about three to four days because of  his deep resection.  He was then sent home early in the morning on January 10, 2003, to come to the office in three days for catheter removal, at which  time we will discuss his pathology report which is still pending.   FINAL DIAGNOSES:  1. 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  2. Heavy  cigarette smoker.   OPERATION:  Cystoscopy and transurethral resection and fulguration of  bladder tumor on January 09, 2003.   CONDITION ON DISCHARGE:  Stable.   DISCHARGE MEDICATIONS:  1. Resume all of his home medications.  2. Peridium 200 mg every four hours for burning.  3. Cipro XR 500 mg one a day for five days for antibiotic coverage.  4. Tylox one to two every four hours for pain #20.   DISPOSITION:  1. Regular diet.  2.     Limited activity.  3. Force fluids.  4. Return to the office in three days for catheter removal.  Claudette Laws, M.D.    RFS/MEDQ  D:  01/10/2003  T:  01/10/2003  Job:  [id number]

## 2011-01-11 LAB — POCT I-STAT 4, (NA,K, GLUC, HGB,HCT)
Glucose, Bld: 102 — ABNORMAL HIGH
HCT: 46
Hemoglobin: 15.6

## 2011-01-28 LAB — I-STAT EC8
Acid-base deficit: 1
Bicarbonate: 25.4 — ABNORMAL HIGH
Glucose, Bld: 138 — ABNORMAL HIGH
Hemoglobin: 12.9 — ABNORMAL LOW
Operator id: 115261
Potassium: 3.9
Sodium: 140
TCO2: 27

## 2011-04-22 ENCOUNTER — Other Ambulatory Visit (HOSPITAL_COMMUNITY): Payer: Self-pay | Admitting: Surgery

## 2011-04-22 ENCOUNTER — Encounter (HOSPITAL_COMMUNITY): Payer: Self-pay | Admitting: Pharmacy Technician

## 2011-04-22 ENCOUNTER — Other Ambulatory Visit (HOSPITAL_COMMUNITY): Payer: Self-pay | Admitting: Internal Medicine

## 2011-04-22 DIAGNOSIS — R0602 Shortness of breath: Secondary | ICD-10-CM

## 2011-04-28 MED ORDER — SODIUM CHLORIDE 0.9 % IR SOLN
80.0000 mg | Status: DC
  Filled 2011-04-28: qty 2

## 2011-04-28 MED ORDER — SODIUM CHLORIDE 0.45 % IV SOLN: INTRAVENOUS | Status: DC

## 2011-04-28 MED ORDER — CEFAZOLIN SODIUM-DEXTROSE 2-3 GM-% IV SOLR
2.0000 g | INTRAVENOUS | Status: DC
  Filled 2011-04-28: qty 50

## 2011-04-28 MED ORDER — SODIUM CHLORIDE 0.9 % IV SOLN
INTRAVENOUS | Status: DC
  Administered 2011-04-29: 15:00:00 via INTRAVENOUS

## 2011-04-29 ENCOUNTER — Ambulatory Visit (HOSPITAL_COMMUNITY)
Admission: RE | Admit: 2011-04-29 | Discharge: 2011-04-30 | Disposition: A | Discharge status: Home / Self Care | Payer: Managed Care, Other (non HMO) | Source: Ambulatory Visit | Attending: Cardiovascular Disease | Admitting: Cardiovascular Disease

## 2011-04-29 ENCOUNTER — Encounter (HOSPITAL_COMMUNITY)
Admission: RE | Disposition: A | Discharge status: Home / Self Care | Payer: Self-pay | Source: Ambulatory Visit | Attending: Cardiovascular Disease

## 2011-04-29 DIAGNOSIS — I441 Atrioventricular block, second degree: Secondary | ICD-10-CM

## 2011-04-29 DIAGNOSIS — E782 Mixed hyperlipidemia: Secondary | ICD-10-CM | POA: Diagnosis present

## 2011-04-29 DIAGNOSIS — I471 Supraventricular tachycardia, unspecified: Secondary | ICD-10-CM | POA: Diagnosis not present

## 2011-04-29 DIAGNOSIS — I10 Essential (primary) hypertension: Secondary | ICD-10-CM | POA: Diagnosis present

## 2011-04-29 DIAGNOSIS — R55 Syncope and collapse: Secondary | ICD-10-CM | POA: Diagnosis not present

## 2011-04-29 DIAGNOSIS — Z720 Tobacco use: Secondary | ICD-10-CM | POA: Diagnosis present

## 2011-04-29 DIAGNOSIS — R0602 Shortness of breath: Secondary | ICD-10-CM

## 2011-04-29 DIAGNOSIS — F17200 Nicotine dependence, unspecified, uncomplicated: Secondary | ICD-10-CM | POA: Insufficient documentation

## 2011-04-29 DIAGNOSIS — E785 Hyperlipidemia, unspecified: Secondary | ICD-10-CM | POA: Insufficient documentation

## 2011-04-29 DIAGNOSIS — G4733 Obstructive sleep apnea (adult) (pediatric): Secondary | ICD-10-CM | POA: Diagnosis present

## 2011-04-29 DIAGNOSIS — E119 Type 2 diabetes mellitus without complications: Secondary | ICD-10-CM | POA: Diagnosis present

## 2011-04-29 DIAGNOSIS — I495 Sick sinus syndrome: Secondary | ICD-10-CM | POA: Insufficient documentation

## 2011-04-29 HISTORY — PX: PERMANENT PACEMAKER INSERTION: SHX5480

## 2011-04-29 HISTORY — PX: PERMANENT PACEMAKER INSERTION: SHX6023

## 2011-04-29 HISTORY — PX: LOOP RECORDER EXPLANT: SHX5476

## 2011-04-29 HISTORY — DX: Cardiac arrhythmia, unspecified: I49.9

## 2011-04-29 HISTORY — DX: Atrioventricular block, second degree: I44.1

## 2011-04-29 LAB — SURGICAL PCR SCREEN: Staphylococcus aureus: POSITIVE — AB

## 2011-04-29 LAB — GLUCOSE, CAPILLARY: Glucose-Capillary: 116 mg/dL — ABNORMAL HIGH (ref 70–99)

## 2011-04-29 LAB — CBC
Hemoglobin: 14.2 g/dL (ref 13.0–17.0)
MCH: 32.9 pg (ref 26.0–34.0)
MCHC: 33.9 g/dL (ref 30.0–36.0)

## 2011-04-29 LAB — CREATININE, SERUM: Creatinine, Ser: 1.02 mg/dL (ref 0.50–1.35)

## 2011-04-29 SURGERY — PERMANENT PACEMAKER INSERTION
Anesthesia: LOCAL

## 2011-04-29 MED ORDER — HEPARIN (PORCINE) IN NACL 2-0.9 UNIT/ML-% IJ SOLN
INTRAMUSCULAR | Status: AC
  Filled 2011-04-29: qty 1000

## 2011-04-29 MED ORDER — SODIUM CHLORIDE 0.9 % IV SOLN
INTRAVENOUS | Status: DC
  Administered 2011-04-29: 21:00:00 via INTRAVENOUS

## 2011-04-29 MED ORDER — MUPIROCIN 2 % EX OINT
1.0000 "application " | TOPICAL_OINTMENT | Freq: Once | CUTANEOUS | Status: AC
  Administered 2011-04-30: 1 via NASAL
  Filled 2011-04-29: qty 22

## 2011-04-29 MED ORDER — ONDANSETRON HCL 4 MG/2ML IJ SOLN: 4.0000 mg | Freq: Four times a day (QID) | INTRAMUSCULAR | Status: DC | PRN

## 2011-04-29 MED ORDER — CEFAZOLIN SODIUM-DEXTROSE 2-3 GM-% IV SOLR
INTRAVENOUS | Status: AC
  Filled 2011-04-29: qty 50

## 2011-04-29 MED ORDER — ACETAMINOPHEN 325 MG PO TABS: 325.0000 mg | ORAL_TABLET | ORAL | Status: DC | PRN

## 2011-04-29 MED ORDER — LEVALBUTEROL HCL 0.63 MG/3ML IN NEBU
0.6300 mg | INHALATION_SOLUTION | Freq: Four times a day (QID) | RESPIRATORY_TRACT | Status: DC | PRN
Start: 2011-04-29 — End: 2011-04-30
  Filled 2011-04-29: qty 3

## 2011-04-29 MED ORDER — ACETAMINOPHEN 500 MG PO TABS
1000.0000 mg | ORAL_TABLET | Freq: Four times a day (QID) | ORAL | Status: DC
  Administered 2011-04-30: 1000 mg via ORAL
  Filled 2011-04-29 (×4): qty 2

## 2011-04-29 MED ORDER — SODIUM CHLORIDE 0.9 % IV SOLN
250.0000 mL | INTRAVENOUS | Status: DC | PRN
  Administered 2011-04-29: 250 mL via INTRAVENOUS

## 2011-04-29 MED ORDER — SIMVASTATIN 40 MG PO TABS: 40.0000 mg | ORAL_TABLET | ORAL | Status: DC

## 2011-04-29 MED ORDER — ASPIRIN EC 325 MG PO TBEC: 325.0000 mg | DELAYED_RELEASE_TABLET | Freq: Every day | ORAL | Status: DC

## 2011-04-29 MED ORDER — NIACIN 500 MG PO TABS
500.0000 mg | ORAL_TABLET | Freq: Two times a day (BID) | ORAL | Status: DC
  Administered 2011-04-30: 500 mg via ORAL
  Filled 2011-04-29 (×3): qty 1

## 2011-04-29 MED ORDER — CEFAZOLIN SODIUM 1-5 GM-% IV SOLN
1.0000 g | Freq: Four times a day (QID) | INTRAVENOUS | Status: AC
  Administered 2011-04-29 – 2011-04-30 (×3): 1 g via INTRAVENOUS
  Filled 2011-04-29 (×3): qty 50

## 2011-04-29 MED ORDER — DILTIAZEM HCL ER COATED BEADS 360 MG PO CP24
360.0000 mg | ORAL_CAPSULE | ORAL | Status: DC
  Administered 2011-04-30: 360 mg via ORAL
  Filled 2011-04-29 (×3): qty 1

## 2011-04-29 MED ORDER — ADULT MULTIVITAMIN W/MINERALS CH
1.0000 | ORAL_TABLET | Freq: Every day | ORAL | Status: DC
  Administered 2011-04-29: 1 via ORAL
  Filled 2011-04-29 (×2): qty 1

## 2011-04-29 MED ORDER — ROSUVASTATIN CALCIUM 10 MG PO TABS
10.0000 mg | ORAL_TABLET | Freq: Every day | ORAL | Status: DC
  Administered 2011-04-30: 10 mg via ORAL
  Filled 2011-04-29 (×3): qty 1

## 2011-04-29 MED ORDER — MUPIROCIN 2 % EX OINT
TOPICAL_OINTMENT | Freq: Once | CUTANEOUS | Status: AC
  Administered 2011-04-29: 1 via NASAL
  Filled 2011-04-29: qty 22

## 2011-04-29 MED ORDER — LIDOCAINE HCL (PF) 1 % IJ SOLN
INTRAMUSCULAR | Status: AC
  Filled 2011-04-29: qty 60

## 2011-04-29 MED ORDER — LEVALBUTEROL HCL 0.63 MG/3ML IN NEBU
0.6300 mg | INHALATION_SOLUTION | RESPIRATORY_TRACT | Status: AC
  Administered 2011-04-29: 0.63 mg via RESPIRATORY_TRACT

## 2011-04-29 MED ORDER — GLIPIZIDE 10 MG PO TABS
10.0000 mg | ORAL_TABLET | ORAL | Status: DC
  Administered 2011-04-30: 10 mg via ORAL
  Filled 2011-04-29 (×2): qty 1

## 2011-04-29 MED ORDER — METFORMIN HCL 500 MG PO TABS
1000.0000 mg | ORAL_TABLET | Freq: Two times a day (BID) | ORAL | Status: DC
  Administered 2011-04-30: 1000 mg via ORAL
  Filled 2011-04-29 (×3): qty 2

## 2011-04-29 MED ORDER — FENTANYL CITRATE 0.05 MG/ML IJ SOLN
INTRAMUSCULAR | Status: AC
  Filled 2011-04-29: qty 2

## 2011-04-29 MED ORDER — HEPARIN SODIUM (PORCINE) 5000 UNIT/ML IJ SOLN
5000.0000 [IU] | Freq: Three times a day (TID) | INTRAMUSCULAR | Status: DC
  Filled 2011-04-29 (×5): qty 1

## 2011-04-29 MED ORDER — CHLORHEXIDINE GLUCONATE 4 % EX LIQD
60.0000 mL | Freq: Once | CUTANEOUS | Status: DC
  Filled 2011-04-29: qty 60

## 2011-04-29 MED ORDER — HYDROCODONE-ACETAMINOPHEN 5-325 MG PO TABS: 1.0000 | ORAL_TABLET | ORAL | Status: DC | PRN

## 2011-04-29 MED ORDER — SODIUM CHLORIDE 0.9 % IJ SOLN
3.0000 mL | Freq: Two times a day (BID) | INTRAMUSCULAR | Status: DC
  Administered 2011-04-30: 3 mL via INTRAVENOUS

## 2011-04-29 MED ORDER — FENOFIBRATE 160 MG PO TABS
160.0000 mg | ORAL_TABLET | Freq: Every day | ORAL | Status: DC
  Administered 2011-04-30: 160 mg via ORAL
  Filled 2011-04-29 (×3): qty 1

## 2011-04-29 MED ORDER — MUPIROCIN 2 % EX OINT
TOPICAL_OINTMENT | CUTANEOUS | Status: AC
  Administered 2011-04-29: 1 via NASAL
  Filled 2011-04-29: qty 22

## 2011-04-29 MED ORDER — LISINOPRIL 40 MG PO TABS
40.0000 mg | ORAL_TABLET | Freq: Every day | ORAL | Status: DC
  Administered 2011-04-30: 40 mg via ORAL
  Filled 2011-04-29: qty 1

## 2011-04-29 MED ORDER — LIDOCAINE HCL (PF) 1 % IJ SOLN
INTRAMUSCULAR | Status: AC
  Filled 2011-04-29: qty 30

## 2011-04-29 MED ORDER — SODIUM CHLORIDE 0.9 % IJ SOLN: 3.0000 mL | INTRAMUSCULAR | Status: DC | PRN

## 2011-04-29 NOTE — H&P (Signed)
Dustin Estes is an 61 y.o. male.   Chief Complaint:  Syncope  HPI:  Vision is a 61 year old Caucasian male with a loop recorder implanted in 05/03/2010. Interrogation devices with definite multiples episodes of fast ventricular ventricular tachycardia. The SVT and 9 episodes of asystole as her heart rates as high as the 180s. 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 He had a 2-D echocardiogram in December 2011 which really revealed an ejection fraction of 55% mild asymmetric left ventricular hypertrophy impaired LV relaxation and no real regional wall motion abnormalities. His initial nuclear stress test and also in December 2011 which revealed a moderate mostly fixed inferior septal defect extent of 11% post stress ejection fraction is 57% with septal dyskinesis. Scan was felt to be low risk. His history also includes anemia hypertension or reticulosis hyperlipidemia tobacco use and short sleep apnea diabetes mellitus type 2.  He presents for implantation of permanent pacemaker.  past medical history  Objective sleep apnea, hypertension, diabetes mellitus type 2, hyperlipidemia, anemia, diverticulosis, syncope, tobacco abuse    No family history on file. Social History:  The patient smokes 3 packs of cigarettes per day  Medications Prempro 40 mg daily, simvastatin 40 mg daily, diltiazem 360 mg daily, metformin 1000 mg twice daily, glipizide 10 mg daily, aspirin 325 mg daily, niacin 500 mg twice daily, flax seed 1000 mg twice daily, fenofibrate 160 mg daily  Allergies: No Known Allergies  Medications Prior to Admission  Medication Dose Route Frequency Provider Last Rate Last Dose  . 0.45 % sodium chloride infusion   Intravenous Continuous Mihai Croitoru, MD      . 0.9 %  sodium chloride infusion   Intravenous Continuous Mihai Croitoru, MD      . ceFAZolin (ANCEF) 2-3 GM-% IVPB SOLR           . ceFAZolin (ANCEF) IVPB 2 g/50 mL premix  2 g  Intravenous On Call Mihai Croitoru, MD      . chlorhexidine (HIBICLENS) 4 % liquid 4 application  60 mL Topical Once Mihai Croitoru, MD      . gentamicin (GARAMYCIN) 80 mg in sodium chloride irrigation 0.9 % 500 mL irrigation  80 mg Irrigation On Call Thurmon Fair, MD      . mupirocin ointment (BACTROBAN) 2 %   Nasal Once Mihai Croitoru, MD      . mupirocin ointment (BACTROBAN) 2 %            No current outpatient prescriptions on file as of 04/29/2011.   Labs01/07/2011 Sodium 141 potassium 4.3 chloride 101 carbon dioxide 27 glucose 59 BUN 21 creatinine 1.08 calcium 9.4 total cholesterol 162, triglyceride 824, HDL 25, total cholesterol/HDL ratio 6.5 TSH 2.084 PT 13.2 INR 0.96 PTT 30 WBC is 9.7 hemoglobin 14.1 hematocrit 45.7 MCV 103.4 platelets 230 urinalysis showed trace leukocyte esterase   Results for orders placed during the hospital encounter of 04/29/11 (from the past 48 hour(s))  GLUCOSE, CAPILLARY     Status: Abnormal   Collection Time   04/29/11  1:45 PM      Component Value Range Comment   Glucose-Capillary 116 (*) 70 - 99 (mg/dL)    No results found.  Review of Systems  Constitutional: Negative for fever and diaphoresis.  HENT: Negative for congestion.   Eyes: Negative for blurred vision and double vision.  Respiratory: Negative for cough and shortness of breath.   Cardiovascular: Positive for claudication. Negative for chest pain,  palpitations, orthopnea and leg swelling.  Gastrointestinal: Negative for nausea, vomiting, abdominal pain, blood in stool and melena.  Genitourinary: Negative for dysuria and hematuria.  Neurological: Negative for dizziness and weakness.  All other systems reviewed and are negative.    Blood pressure 115/72, pulse 98, temperature 97.3 F (36.3 C), temperature source Oral, resp. rate 18, height 5\' 9"  (1.753 m), weight 108.863 kg (240 lb), SpO2 99.00%. Physical Exam  Constitutional: He is oriented to person, place, and time. No distress.        Morbidly obese  HENT:  Head: Normocephalic and atraumatic.  Eyes: EOM are normal. Pupils are equal, round, and reactive to light. No scleral icterus.  Neck: Normal range of motion. Neck supple.  Cardiovascular: Normal rate and regular rhythm.   No murmur heard. Respiratory: He has no wheezes. He has no rales.       Decreased BS bilaterally.  Increased efftort  GI: Soft. Bowel sounds are normal. He exhibits distension. There is no tenderness.  Musculoskeletal: He exhibits edema.       2+ pitting edema  Lymphadenopathy:    He has no cervical adenopathy.  Neurological: He is alert and oriented to person, place, and time. He exhibits normal muscle tone.  Skin: Skin is warm and dry.  Psychiatric: He has a normal mood and affect.     Assessment/Plan Syncope SVT Objective sleep apnea, noncompliant with CPAP Hypertension diabetes mellitus type 2 Dysperlipidemia Tobacco abuse  Plan:  Xopenex nebulizer stat.  For implantation of Medtronic pacemaker.    Dustin Estes W 04/29/2011, 1:59 PM

## 2011-04-29 NOTE — Progress Notes (Signed)
No CXR needed per Dr. Royann Shivers.

## 2011-04-29 NOTE — H&P (Signed)
Agree. Will remove ILR at time of PM implantation.

## 2011-04-29 NOTE — Op Note (Signed)
Procedure report  Procedure performed:  1. Implantation of new dual chamber permanent pacemaker 2. Fluoroscopy 3.  Light sedation 4. Explantation of loop recorder  Reason for procedure:  Symptomatic bradycardia and syncope due to: Sinus node dysfunction Bradycardia due to necessary medications   Procedure performed by: Thurmon Fair, MD  Complications: None  Estimated blood loss: <10 mL  Medications administered during procedure:  Ancef 2 g intravenously Lidocaine 1% 30 mL locally,  Fentanyl 75 mcg intravenously   Device details:  Generator Medtronic adapta model ADDRL 1 serial number [id number] Right atrial lead Medtronic 5076-52 cm serial number [id number] Right ventricular lead Medtronic 5076-58 cm serial number [id number]  Explanted loop recorder Medtronic reveal XT 9529 serial number [id number]  (Implant May 03 2010)  Procedure details:  After the risks and benefits of the procedure were discussed the patient provided informed consent and was brought to the cardiac cath lab in the fasting state. The patient was prepped and draped in usual sterile fashion. Local anesthesia with 1% lidocaine was administered to to the Left infraclavicular area. A 5-6 cm horizontal incision was made parallel with and 2-3 cm caudal to the left clavicle. Using electrocautery and blunt dissection a prepectoral pocket was created down to the level of the pectoralis major muscle fascia. The pocket was carefully inspected for hemostasis. An antibiotic-soaked sponge was placed in the pocket.  Under fluoroscopic guidance and using the modified Seldinger technique 2 separate venipunctures were performed to access the left subclavian vein. no difficulty was encountered accessing the vein.  Two J-tip guidewires were subsequently exchanged for two 7 French safe sheaths.  Under fluoroscopic guidance the ventricular lead was advanced to level of the mid to apical right ventricular septum and  thet active-fixation helix was deployed. Prominent current of injury was seen. Satisfactory pacing and sensing parameters were recorded. There was no evidence of diaphragmatic stimulation at maximum device output. The safe sheath was peeled away and the lead was secured in place with 2-0 silk.  In similar fashion the right atrial lead was advanced to the level of the atrial appendage. The active-fixation helix was deployed. There was prominent current of injury. Satisfactory  pacing and sensing parameters were recorded. There was no evidence of diaphragmatic stimulation with pacing at maximum device output. The safe sheath was peeled away and the lead was secured in place with 2-0 silk.  The antibiotic-soaked sponge was removed from the pocket. The pocket was flushed with copious amounts of antibiotic solution. Reinspection showed excellent hemostasis..  The ventricular lead was connected to the generator and appropriate ventricular pacing was seen. Subsequently the atrial lead was also connected. Repeat testing of the lead parameters later showed excellent values.  The entire system was then carefully inserted in the pocket with care been taking that the leads and device assumed a comfortable position without pressure on the incision. Great care was taken that the leads be located deep to the generator. The pocket was then closed in layers using 2 layers of 2-0 Vicryl and cutaneous staples, after which a sterile dressing was applied.  Subsequently, attention was given to explantation of theloop recorder. Local anesthesia with 1% lidocaine was administered to the area of the loop recorder scar. A 2 cm horizontal incision was made. Limited electrocautery was necessary for hemostasis. Using blunt and sharp dissection the loop recorder pocket was exposed and the device was explanted without difficulty. The pocket was flushed with copious amounts of antibiotic solution and reinspected for hemostasis. Was then  closed with 2 layers of 2-0 Vicryl in a couple of cutaneous staples. A sterile dressing was then applied  At the end of the procedure the following lead parameters were encountered:  Right atrial lead  sensed P waves 5.9 mV, impedance 953 ohms, threshold 1.4 V at 0.5 ms pulse width.  Right ventricular lead sensed R waves 11 mV, impedance 1110ohms, threshold 0.7 V at 0.5 ms pulse width.   Cc:

## 2011-04-30 ENCOUNTER — Encounter (HOSPITAL_COMMUNITY): Payer: Self-pay | Admitting: *Deleted

## 2011-04-30 ENCOUNTER — Other Ambulatory Visit: Payer: Self-pay

## 2011-04-30 ENCOUNTER — Ambulatory Visit (HOSPITAL_COMMUNITY): Payer: Managed Care, Other (non HMO)

## 2011-04-30 DIAGNOSIS — G4733 Obstructive sleep apnea (adult) (pediatric): Secondary | ICD-10-CM | POA: Diagnosis present

## 2011-04-30 DIAGNOSIS — I471 Supraventricular tachycardia, unspecified: Secondary | ICD-10-CM | POA: Diagnosis not present

## 2011-04-30 DIAGNOSIS — E119 Type 2 diabetes mellitus without complications: Secondary | ICD-10-CM | POA: Diagnosis present

## 2011-04-30 DIAGNOSIS — Z720 Tobacco use: Secondary | ICD-10-CM | POA: Diagnosis present

## 2011-04-30 DIAGNOSIS — R55 Syncope and collapse: Secondary | ICD-10-CM | POA: Diagnosis not present

## 2011-04-30 DIAGNOSIS — E782 Mixed hyperlipidemia: Secondary | ICD-10-CM | POA: Diagnosis present

## 2011-04-30 DIAGNOSIS — I10 Essential (primary) hypertension: Secondary | ICD-10-CM | POA: Diagnosis present

## 2011-04-30 HISTORY — DX: Supraventricular tachycardia: I47.1

## 2011-04-30 HISTORY — DX: Supraventricular tachycardia, unspecified: I47.10

## 2011-04-30 LAB — GLUCOSE, CAPILLARY: Glucose-Capillary: 154 mg/dL — ABNORMAL HIGH (ref 70–99)

## 2011-04-30 MED ORDER — TIOTROPIUM BROMIDE MONOHYDRATE 18 MCG IN CAPS
18.0000 ug | ORAL_CAPSULE | Freq: Every day | RESPIRATORY_TRACT | Status: DC
Start: 2011-04-30 — End: 2012-11-08

## 2011-04-30 MED ORDER — METOPROLOL TARTRATE 12.5 MG HALF TABLET: 12.5000 mg | ORAL_TABLET | Freq: Two times a day (BID) | ORAL | Status: DC

## 2011-04-30 MED ORDER — ACETAMINOPHEN 325 MG PO TABS: 325.0000 mg | ORAL_TABLET | ORAL | Status: AC | PRN

## 2011-04-30 MED ORDER — METFORMIN HCL 500 MG PO TABS: 1000.0000 mg | ORAL_TABLET | Freq: Two times a day (BID) | ORAL | Status: DC

## 2011-04-30 MED ORDER — METOPROLOL TARTRATE 12.5 MG HALF TABLET
12.5000 mg | ORAL_TABLET | Freq: Two times a day (BID) | ORAL | Status: DC
  Administered 2011-04-30: 12.5 mg via ORAL
  Filled 2011-04-30 (×2): qty 1

## 2011-04-30 MED ORDER — TIOTROPIUM BROMIDE MONOHYDRATE 18 MCG IN CAPS
18.0000 ug | ORAL_CAPSULE | Freq: Every day | RESPIRATORY_TRACT | Status: DC
  Administered 2011-04-30: 18 ug via RESPIRATORY_TRACT
  Filled 2011-04-30: qty 5

## 2011-04-30 NOTE — Progress Notes (Signed)
[facility]  Subjective: No Complaints.  Was sleeping flat in the bed when I entered.  Objective: Vital signs in last 24 hours: Temp:  [97.3 F (36.3 C)-99.7 F (37.6 C)] 99.3 F (37.4 C) (01/12 0738) Pulse Rate:  [86-98] 93  (01/12 0738) Resp:  [18-22] 18  (01/12 0738) BP: (115-135)/(60-81) 135/81 mmHg (01/12 0738) SpO2:  [90 %-99 %] 96 % (01/12 0738) Weight:  [130.865 kg (240 lb)] 108.863 kg (240 lb) (01/11 1311) Last BM Date: 04/30/11  Intake/Output from previous day: 01/11 0701 - 01/12 0700 In: 237.5 [I.V.:187.5; IV Piggyback:50] Out: -  Intake/Output this shift:    Medications Current Facility-Administered Medications  Medication Dose Route Frequency Provider Last Rate Last Dose  . 0.9 %  sodium chloride infusion  250 mL Intravenous PRN Thurmon Fair, MD 50 mL/hr at 04/29/11 2030 250 mL at 04/29/11 2030  . acetaminophen (TYLENOL) tablet 1,000 mg  1,000 mg Oral Q6H Mihai Croitoru, MD       Followed by  . acetaminophen (TYLENOL) tablet 325-650 mg  325-650 mg Oral Q4H PRN Mihai Croitoru, MD      . aspirin EC tablet 325 mg  325 mg Oral QHS Mihai Croitoru, MD      . ceFAZolin (ANCEF) 2-3 GM-% IVPB SOLR           . ceFAZolin (ANCEF) IVPB 1 g/50 mL premix  1 g Intravenous Q6H Mihai Croitoru, MD   1 g at 04/30/11 0602  . diltiazem (CARDIZEM CD) 24 hr capsule 360 mg  360 mg Oral Q0700 Mihai Croitoru, MD      . fenofibrate tablet 160 mg  160 mg Oral QHS Mihai Croitoru, MD   160 mg at 04/30/11 0003  . fentaNYL (SUBLIMAZE) 0.05 MG/ML injection           . glipiZIDE (GLUCOTROL) tablet 10 mg  10 mg Oral Q0700 Mihai Croitoru, MD      . heparin 2-0.9 UNIT/ML-% infusion           . heparin injection 5,000 Units  5,000 Units Subcutaneous Q8H Mihai Croitoru, MD      . HYDROcodone-acetaminophen (NORCO) 5-325 MG per tablet 1-2 tablet  1-2 tablet Oral Q4H PRN Mihai Croitoru, MD      . levalbuterol (XOPENEX) nebulizer solution 0.63 mg  0.63 mg Nebulization  STAT Dwana Melena, PA   0.63 mg at 04/29/11 1450  . levalbuterol (XOPENEX) nebulizer solution 0.63 mg  0.63 mg Nebulization Q6H PRN Mihai Croitoru, MD      . lidocaine (XYLOCAINE) 1 % injection           . lidocaine (XYLOCAINE) 1 % injection           . lisinopril (PRINIVIL,ZESTRIL) tablet 40 mg  40 mg Oral Daily Mihai Croitoru, MD      . metFORMIN (GLUCOPHAGE) tablet 1,000 mg  1,000 mg Oral BID WC Mihai Croitoru, MD      . mulitivitamin with minerals tablet 1 tablet  1 tablet Oral QHS Mihai Croitoru, MD   1 tablet at 04/29/11 2200  . mupirocin ointment (BACTROBAN) 2 % 1 application  1 application Nasal Once Thurmon Fair, MD   1 application at 04/30/11 0020  . mupirocin ointment (BACTROBAN) 2 %   Nasal Once Thurmon Fair, MD   1 application at 04/29/11 1401  . niacin tablet 500 mg  500 mg Oral BID WC Mihai Croitoru, MD      . ondansetron (ZOFRAN) injection 4  mg  4 mg Intravenous Q6H PRN Mihai Croitoru, MD      . rosuvastatin (CRESTOR) tablet 10 mg  10 mg Oral q1800 Mihai Croitoru, MD   10 mg at 04/30/11 0001  . sodium chloride 0.9 % injection 3 mL  3 mL Intravenous Q12H Mihai Croitoru, MD      . sodium chloride 0.9 % injection 3 mL  3 mL Intravenous PRN Mihai Croitoru, MD      . DISCONTD: 0.45 % sodium chloride infusion   Intravenous Continuous Mihai Croitoru, MD      . DISCONTD: 0.9 %  sodium chloride infusion   Intravenous Continuous Mihai Croitoru, MD 75 mL/hr at 04/29/11 1452    . DISCONTD: 0.9 %  sodium chloride infusion   Intravenous Continuous Mihai Croitoru, MD      . DISCONTD: ceFAZolin (ANCEF) IVPB 2 g/50 mL premix  2 g Intravenous On Call Thurmon Fair, MD      . DISCONTD: chlorhexidine (HIBICLENS) 4 % liquid 4 application  60 mL Topical Once Mihai Croitoru, MD      . DISCONTD: gentamicin (GARAMYCIN) 80 mg in sodium chloride irrigation 0.9 % 500 mL irrigation  80 mg Irrigation On Call Thurmon Fair, MD      . DISCONTD: simvastatin (ZOCOR) tablet 40 mg  40 mg Oral Q0700 Thurmon Fair, MD        PE: General appearance: alert, cooperative and no distress Lungs: Decreased BS and bilateral wheezing. Heart: regular rhythm, Rate tachy,  S1, S2 normal, no murmur, click, rub or gallop Extremities: 1+ LEE Pulses: 2+ radials Pacer site: covered.  Minimal drainage signs on gauze.  Lab Results:   [facility] 04/29/11 2038  WBC 9.2  HGB 14.2  HCT 41.9  PLT 184   BMET  Basename 04/29/11 2038  NA --  K --  CL --  CO2 --  GLUCOSE --  BUN --  CREATININE 1.02  CALCIUM --    Studies/Results: PM implant, 04/29/11 Procedure performed:  1. Implantation of new dual chamber permanent pacemaker  2. Fluoroscopy  3. Light sedation  4. Explantation of loop recorder  Reason for procedure:  Symptomatic bradycardia and syncope due to:  Sinus node dysfunction  Bradycardia due to necessary medications  Procedure performed by:  Thurmon Fair, MD  Complications:  None  Estimated blood loss:  <10 mL  Medications administered during procedure:  Ancef 2 g intravenously  Lidocaine 1% 30 mL locally,  Fentanyl 75 mcg intravenously  Device details:  Generator Medtronic adapta model ADDRL 1 serial number [id number]  Right atrial lead Medtronic 5076-52 cm serial number [id number]  Right ventricular lead Medtronic 5076-58 cm serial number [id number]  Explanted loop recorder Medtronic reveal XT 9529 serial number [id number] (Implant May 03 2010)  Procedure details:  After the risks and benefits of the procedure were discussed the patient provided informed consent and was brought to the cardiac cath lab in the fasting state. The patient was prepped and draped in usual sterile fashion. Local anesthesia with 1% lidocaine was administered to to the Left infraclavicular area. A 5-6 cm horizontal incision was made parallel with and 2-3 cm caudal to the left clavicle. Using electrocautery and blunt dissection a prepectoral pocket was created down to the level of the pectoralis  major muscle fascia. The pocket was carefully inspected for hemostasis. An antibiotic-soaked sponge was placed in the pocket.  Under fluoroscopic guidance and using the modified Seldinger technique 2 separate venipunctures were performed to access the left  subclavian vein. no difficulty was encountered accessing the vein. Two J-tip guidewires were subsequently exchanged for two 7 French safe sheaths.  Under fluoroscopic guidance the ventricular lead was advanced to level of the mid to apical right ventricular septum and thet active-fixation helix was deployed. Prominent current of injury was seen. Satisfactory pacing and sensing parameters were recorded. There was no evidence of diaphragmatic stimulation at maximum device output. The safe sheath was peeled away and the lead was secured in place with 2-0 silk.  In similar fashion the right atrial lead was advanced to the level of the atrial appendage. The active-fixation helix was deployed. There was prominent current of injury. Satisfactory pacing and sensing parameters were recorded. There was no evidence of diaphragmatic stimulation with pacing at maximum device output. The safe sheath was peeled away and the lead was secured in place with 2-0 silk.  The antibiotic-soaked sponge was removed from the pocket. The pocket was flushed with copious amounts of antibiotic solution. Reinspection showed excellent hemostasis..  The ventricular lead was connected to the generator and appropriate ventricular pacing was seen. Subsequently the atrial lead was also connected. Repeat testing of the lead parameters later showed excellent values.  The entire system was then carefully inserted in the pocket with care been taking that the leads and device assumed a comfortable position without pressure on the incision. Great care was taken that the leads be located deep to the generator. The pocket was then closed in layers using 2 layers of 2-0 Vicryl and cutaneous staples,  after which a sterile dressing was applied.  Subsequently, attention was given to explantation of theloop recorder. Local anesthesia with 1% lidocaine was administered to the area of the loop recorder scar. A 2 cm horizontal incision was made. Limited electrocautery was necessary for hemostasis. Using blunt and sharp dissection the loop recorder pocket was exposed and the device was explanted without difficulty. The pocket was flushed with copious amounts of antibiotic solution and reinspected for hemostasis. Was then closed with 2 layers of 2-0 Vicryl in a couple of cutaneous staples. A sterile dressing was then applied  At the end of the procedure the following lead parameters were encountered:  Right atrial lead  sensed P waves 5.9 mV, impedance 953 ohms, threshold 1.4 V at 0.5 ms pulse width.  Right ventricular lead sensed R waves 11 mV, impedance 1110ohms, threshold 0.7 V at 0.5 ms pulse width.  CXR, 04/30/11 CHEST - 2 VIEW  Comparison: 01/10/2010  Findings: New left anterior chest wall sequential pacemaker has its  leads superimposed over the right atrium and right ventricle. No  pneumothorax.  The heart, mediastinum and hila are unremarkable. The lungs are  clear.  IMPRESSION:  New left anterior chest wall pacemaker is well positioned. No  pneumothorax.   Assessment/Plan  Active Problems: Syncope  SVT  Objective sleep apnea, noncompliant with CPAP  Hypertension  diabetes mellitus type 2  Dysperlipidemia  Tobacco abuse  Plan:  S/P PPM implant yesterday.  No pneumothorax on CXR.  Will add beta-blocker.  Inquired about smoking cessation and patient indicates no interest in stopping.  PCP: Kipp Brood burnent   LOS: 1 day    HAGER,BRYAN W 04/30/2011 8:08 AM    Patient seen and examined. Agree with assessment and plan. Pacemaker site looks good; no hematoma or ecchymosis.  Diffusely decreased BS c/ COPD.  Would add spiriva.  Add cardioselective beta-blocker.  DC today; f/u Dr  Royann Shivers.   Lennette Bihari, MD, [facility] 04/30/2011 8:31  AM

## 2011-04-30 NOTE — Progress Notes (Signed)
Contacted Dr. Herbie [place] to clarify heparin 5000 unit injection for VTE prophylaxis post PM insertion.  Received orders to not give heparin since the pt has been up OOB.

## 2011-04-30 NOTE — Progress Notes (Signed)
Upon assessment of the pt, I noted inspiratory and expiratory wheezes via auscultation and while breathing.  Pt was in no distress and denied his Q 6hr Neb treatments.  His pulse oximetry reading was in the upper 80's, so I initiated 2L O2 [place].  He is currently stable and in no distress.  Will continue to assess.

## 2011-04-30 NOTE — Discharge Summary (Signed)
Physician Discharge Summary  Patient ID: Dustin Estes MRN: [id number] DOB/AGE: January 01, 1951 61 y.o.  Admit date: 04/29/2011 Discharge date: 04/30/2011  Admission Diagnoses:  Discharge Diagnoses:  Principal Problem:  *Syncope  S/P medtronic PPM implant Active Problems:  SVT (supraventricular tachycardia)  OSA (obstructive sleep apnea): non compliant with CPAP  HTN (hypertension)  DM2 (diabetes mellitus, type 2)  Dyslipidemia  Tobacco abuse   Discharged Condition: stable  Hospital Course:  The patient is a 61 year old Caucasian male with a loop recorder implanted in 05/03/2010. Interrogation of device revealed multiples episodes of sustained ventricular tachycardia. The SVT and 9 episodes of asystole with heart rates as high as the 180s. 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 He had a 2-D echocardiogram in December 2011 which really revealed an ejection fraction of 55% mild asymmetric left ventricular hypertrophy impaired LV relaxation and no real regional wall motion abnormalities. His nuclear stress test  in December 2011 revealed a moderate mostly fixed inferior septal defect. Post stress ejection fraction is 57% with septal dyskinesis. Scan was felt to be low risk. His history also includes anemia, hypertension, OSA-noncompliant with CPAP,  Hyperlipidemia, tobacco use and diabetes mellitus type 2. He presented for implantation of permanent pacemaker.  Dual-chamber permanent pacemaker, Medtronic, was implant 04/29/2011. This was completed without complications. Chest x-ray showed no pneumothorax. The patient had substantial wheezing bilaterally on exam and was given Xopenex treatment. He will be discharged home on Spiriva. Low dose of beta blocker was also added. Patient was recommended to follow up with primary care provider. He has been seen by Dr. Tresa Endo who feels he is stable for discharge home.  The patient will see Dr. Royann Shivers in approximate  7 days for staple removal.     Significant Diagnostic Studies: PPM Implant, 04/29/11. Procedure performed:  1. Implantation of new dual chamber permanent pacemaker  2. Fluoroscopy  3. Light sedation  4. Explantation of loop recorder  Reason for procedure:  Symptomatic bradycardia and syncope due to:  Sinus node dysfunction  Bradycardia due to necessary medications  Procedure performed by:  Thurmon Fair, MD  Complications:  None  Estimated blood loss:  <10 mL  Medications administered during procedure:  Ancef 2 g intravenously  Lidocaine 1% 30 mL locally,  Fentanyl 75 mcg intravenously  Device details:  Generator Medtronic adapta model ADDRL 1 serial number [id number]  Right atrial lead Medtronic 5076-52 cm serial number [id number]  Right ventricular lead Medtronic 5076-58 cm serial number [id number]  Explanted loop recorder Medtronic reveal XT 9529 serial number [id number] (Implant May 03 2010)  Procedure details:  After the risks and benefits of the procedure were discussed the patient provided informed consent and was brought to the cardiac cath lab in the fasting state. The patient was prepped and draped in usual sterile fashion. Local anesthesia with 1% lidocaine was administered to to the Left infraclavicular area. A 5-6 cm horizontal incision was made parallel with and 2-3 cm caudal to the left clavicle. Using electrocautery and blunt dissection a prepectoral pocket was created down to the level of the pectoralis major muscle fascia. The pocket was carefully inspected for hemostasis. An antibiotic-soaked sponge was placed in the pocket.  Under fluoroscopic guidance and using the modified Seldinger technique 2 separate venipunctures were performed to access the left subclavian vein. no difficulty was encountered accessing the vein. Two J-tip guidewires were subsequently exchanged for two 7 French safe sheaths.  Under fluoroscopic guidance the  ventricular lead was  advanced to level of the mid to apical right ventricular septum and thet active-fixation helix was deployed. Prominent current of injury was seen. Satisfactory pacing and sensing parameters were recorded. There was no evidence of diaphragmatic stimulation at maximum device output. The safe sheath was peeled away and the lead was secured in place with 2-0 silk.  In similar fashion the right atrial lead was advanced to the level of the atrial appendage. The active-fixation helix was deployed. There was prominent current of injury. Satisfactory pacing and sensing parameters were recorded. There was no evidence of diaphragmatic stimulation with pacing at maximum device output. The safe sheath was peeled away and the lead was secured in place with 2-0 silk.  The antibiotic-soaked sponge was removed from the pocket. The pocket was flushed with copious amounts of antibiotic solution. Reinspection showed excellent hemostasis..  The ventricular lead was connected to the generator and appropriate ventricular pacing was seen. Subsequently the atrial lead was also connected. Repeat testing of the lead parameters later showed excellent values.  The entire system was then carefully inserted in the pocket with care been taking that the leads and device assumed a comfortable position without pressure on the incision. Great care was taken that the leads be located deep to the generator. The pocket was then closed in layers using 2 layers of 2-0 Vicryl and cutaneous staples, after which a sterile dressing was applied.  Subsequently, attention was given to explantation of theloop recorder. Local anesthesia with 1% lidocaine was administered to the area of the loop recorder scar. A 2 cm horizontal incision was made. Limited electrocautery was necessary for hemostasis. Using blunt and sharp dissection the loop recorder pocket was exposed and the device was explanted without difficulty. The pocket was flushed with copious amounts of  antibiotic solution and reinspected for hemostasis. Was then closed with 2 layers of 2-0 Vicryl in a couple of cutaneous staples. A sterile dressing was then applied  At the end of the procedure the following lead parameters were encountered:  Right atrial lead  sensed P waves 5.9 mV, impedance 953 ohms, threshold 1.4 V at 0.5 ms pulse width.  Right ventricular lead sensed R waves 11 mV, impedance 1110ohms, threshold 0.7 V at 0.5 ms pulse width.  CXR, 04/29/11  CHEST - 2 VIEW  Comparison: 01/10/2010  Findings: New left anterior chest wall sequential pacemaker has its  leads superimposed over the right atrium and right ventricle. No  pneumothorax.  The heart, mediastinum and hila are unremarkable. The lungs are  clear.  IMPRESSION:  New left anterior chest wall pacemaker is well positioned. No  pneumothorax.   Discharge Exam: Blood pressure 135/81, pulse 93, temperature 99.3 F (37.4 C), temperature source Oral, resp. rate 18, height 5\' 9"  (1.753 m), weight 108.863 kg (240 lb), SpO2 96.00%.   Disposition:   Discharge Orders    Future Orders Please Complete By Expires   DG Chest 2 View   06/19/12   Questions: Responses:   Preferred imaging location? [facility]   Reason for exam: SOB   Diet - low sodium heart healthy      Increase activity slowly      Discharge instructions      Call MD for:  redness, tenderness, or signs of infection (pain, swelling, redness, odor or green/yellow discharge around incision site)        Medication List  As of 04/30/2011  9:42 AM   TAKE these medications  acetaminophen 325 MG tablet   Commonly known as: TYLENOL   Take 1-2 tablets (325-650 mg total) by mouth every 4 (four) hours as needed for pain.      aspirin EC 325 MG tablet   Take 325 mg by mouth at bedtime.      diltiazem 360 MG 24 hr capsule   Commonly known as: CARDIZEM CD   Take 360 mg by mouth every morning.      fenofibrate 160 MG tablet   Take 160 mg by mouth at  bedtime.      Flaxseed (Linseed) 1000 MG Caps   Take 1 capsule by mouth 2 (two) times daily.      glipiZIDE 10 MG tablet   Commonly known as: GLUCOTROL   Take 10 mg by mouth every morning.      metFORMIN 500 MG tablet   Commonly known as: GLUCOPHAGE   Take 2 tablets (1,000 mg total) by mouth 2 (two) times daily with a meal.      metoprolol tartrate 12.5 mg Tabs   Commonly known as: LOPRESSOR   Take 0.5 tablets (12.5 mg total) by mouth 2 (two) times daily.      mulitivitamin with minerals Tabs   Take 1 tablet by mouth at bedtime.      niacin 500 MG tablet   Take 500 mg by mouth 2 (two) times daily with a meal.      quinapril 40 MG tablet   Commonly known as: ACCUPRIL   Take 40 mg by mouth every morning.      simvastatin 40 MG tablet   Commonly known as: ZOCOR   Take 40 mg by mouth every morning.      tiotropium 18 MCG inhalation capsule   Commonly known as: SPIRIVA   Place 1 capsule (18 mcg total) into inhaler and inhale daily.             SignedDwana Melena 04/30/2011, 9:42 AM

## 2011-11-02 IMAGING — CT CT HEAD W/O CM
2 series · 15 of 30 positions shown, 19 images · non-contrast
Comparison: 06/01/2009.  Outside report not available.
Interpretation by [HOSPITAL].

CLINICAL DATA: Unresponsive, head trauma.  Diaphoresis.
Bradycardia.

CT HEAD WITHOUT CONTRAST
TECHNIQUE: Contiguous axial images were obtained from the base of
the skull through the vertex without contrast.

[Series 2: head w/o · axial · non-contrast · 0.49mm/px · z∈[+111,+241]mm · 13 of 32 slices shown, 17 images]
[im 3/32  brain]
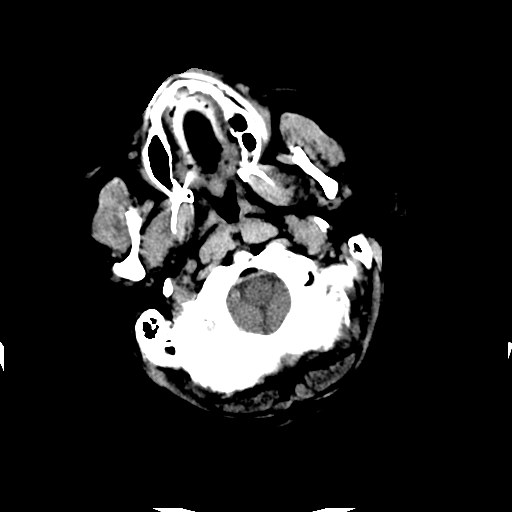
[im 3/32  bone]
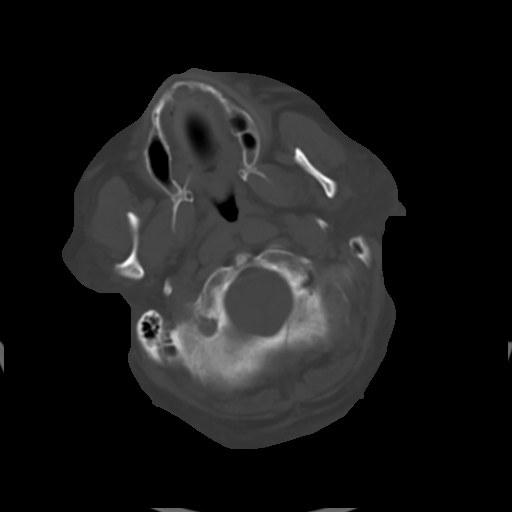
[im 5/32  brain]
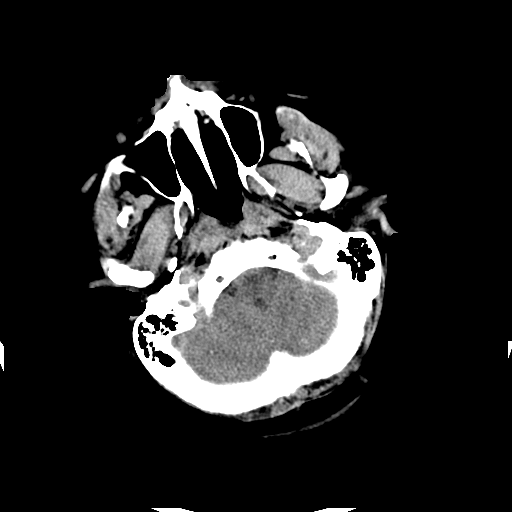
[im 7/32  brain]
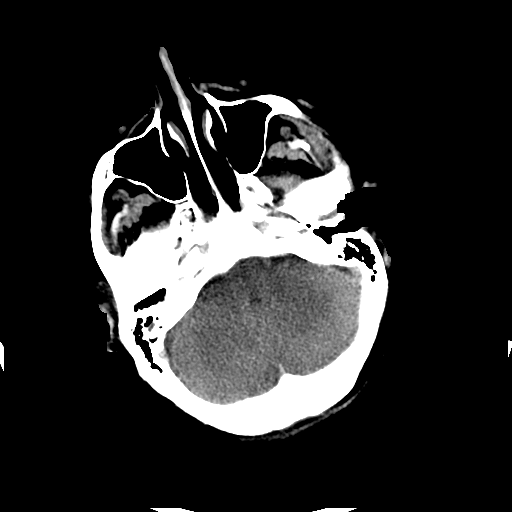
[im 9/32  brain]
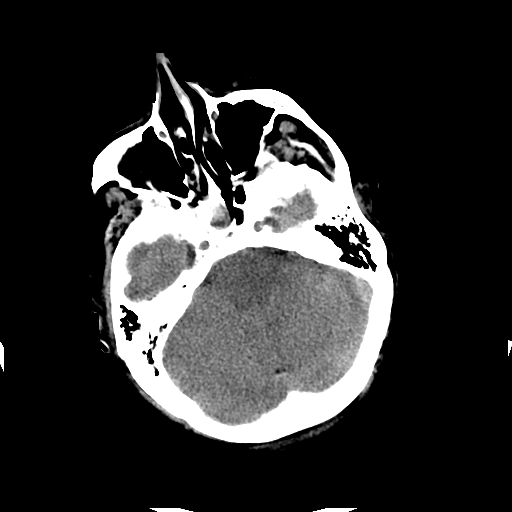
[im 12/32  brain]
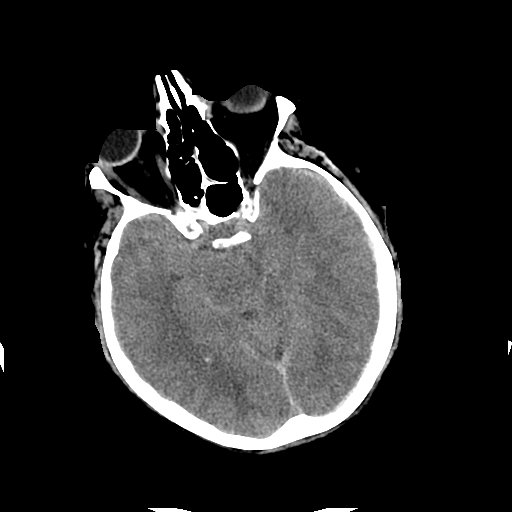
[im 12/32  bone]
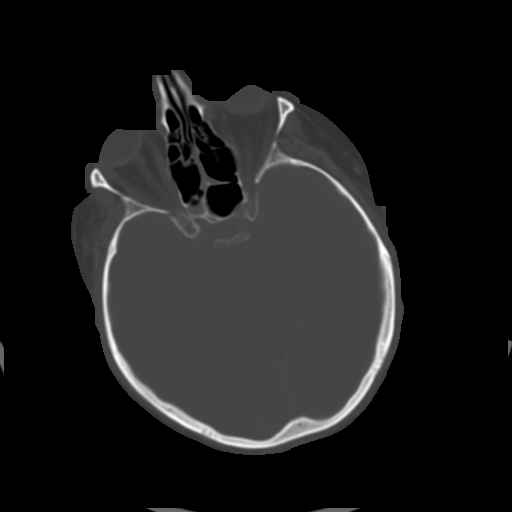
[im 14/32  brain]
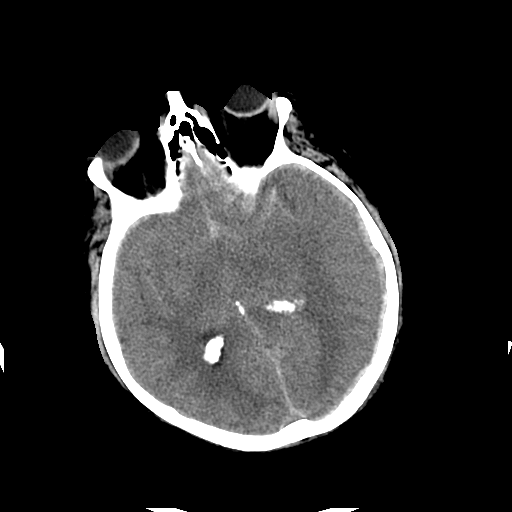
[im 16/32  brain]
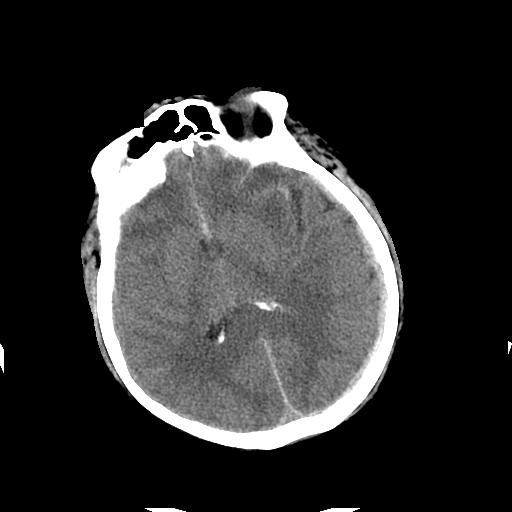
[im 18/32  brain]
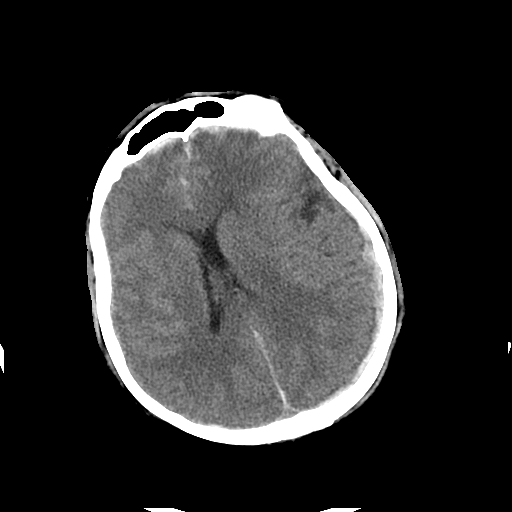
[im 20/32  brain]
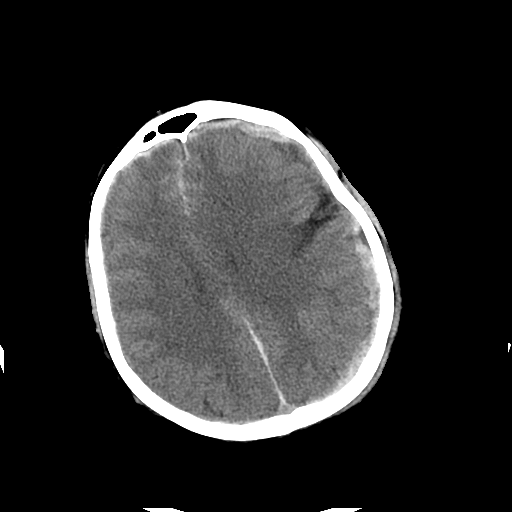
[im 20/32  bone]
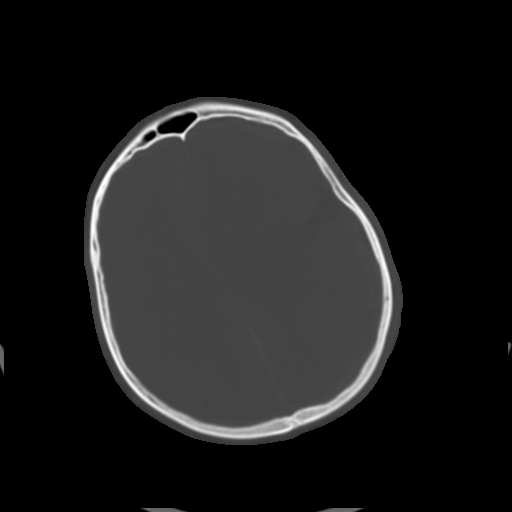
[im 23/32  brain]
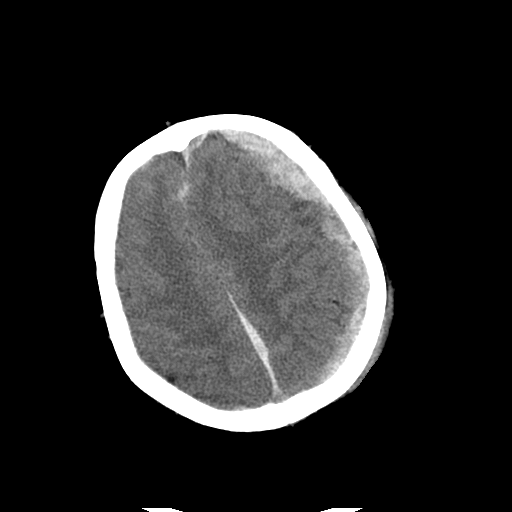
[im 25/32  brain]
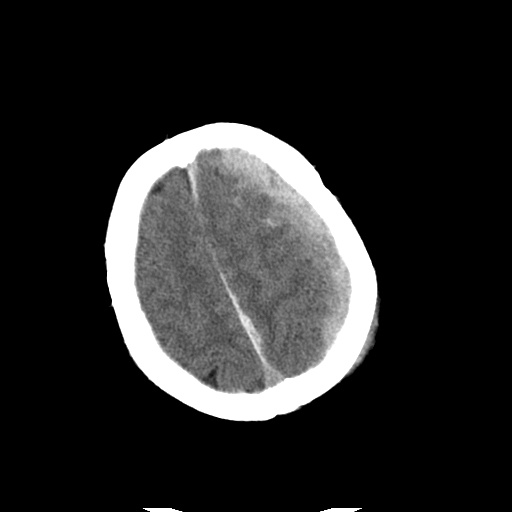
[im 27/32  brain]
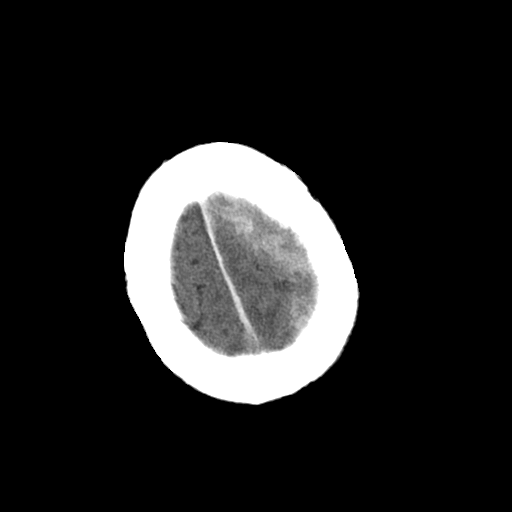
[im 29/32  brain]
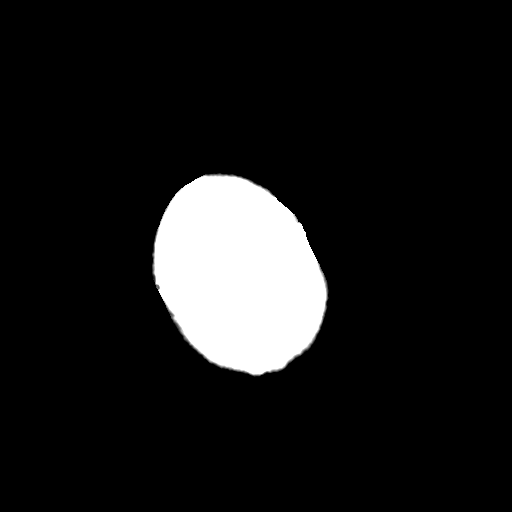
[im 29/32  bone]
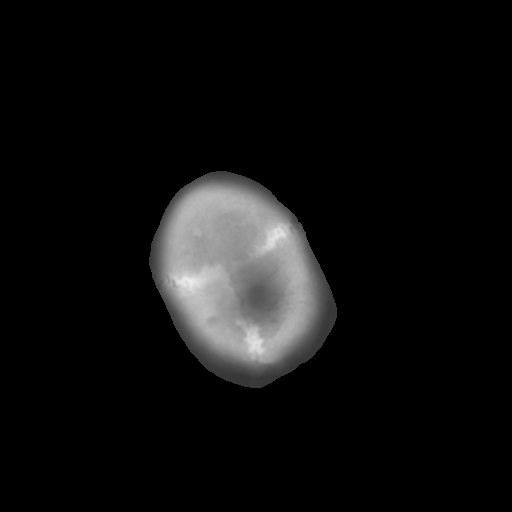

[Series 3: head w/o bone · axial · non-contrast · 0.49mm/px · z∈[+111,+131]mm · 2 of 32 slices shown]
[im 3/32  bone]
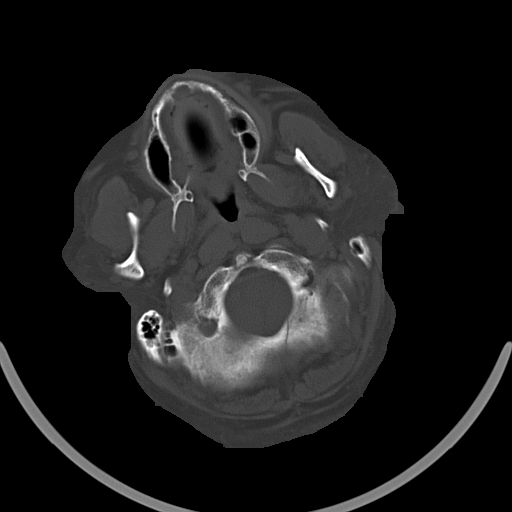
[im 7/32  bone]
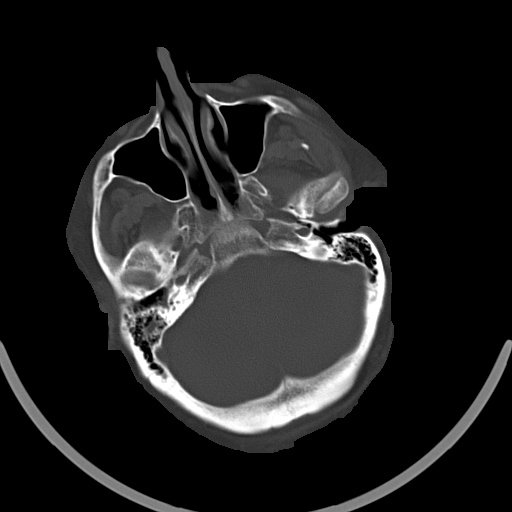

[15 of 30 positions shown; findings below may reference images not displayed]

FINDINGS: There is an acute left subdural hematoma measuring 3 mm
at the level of the left parietal lobe.  Blood extends along the
midline over the right frontal lobe and in the interhemispheric
falx.  There is also blood interdigitating within the gyri along
the interhemispheric falx representing associated subarachnoid
component.  1.4 cm rightward midline shift is noted.  There is mass
effect upon the occipital horn of the left lateral ventricle.
Apparent increased density next to confluent coarse choroid
calcification in the occipital horn of the left lateral ventricle
on image 14 most likely represents a less prominent choroid
calcification as seen on the prior study, less likely
intraventricular hemorrhage.  No skull fracture.  No soft tissue
abnormality.  Orbits/globes are unremarkable.  Sphenoid sinusitis
noted. No visualized mass lesion or acute infarction
IMPRESSION: Acute left greater than right subdural hematoma with associated
subarachnoid hemorrhage and 1.4 cm rightward midline shift .
Critical test results telephoned to Dr. Seaman by Dr. Carlos Emilio at the

## 2011-11-02 IMAGING — CR DG CHEST 1V PORT
1 series · 1 of 1 positions shown · non-contrast
Comparison: 01/08/2010

CLINICAL DATA: Endotracheal tube placement.

PORTABLE CHEST - 1 VIEW

[view not recorded]
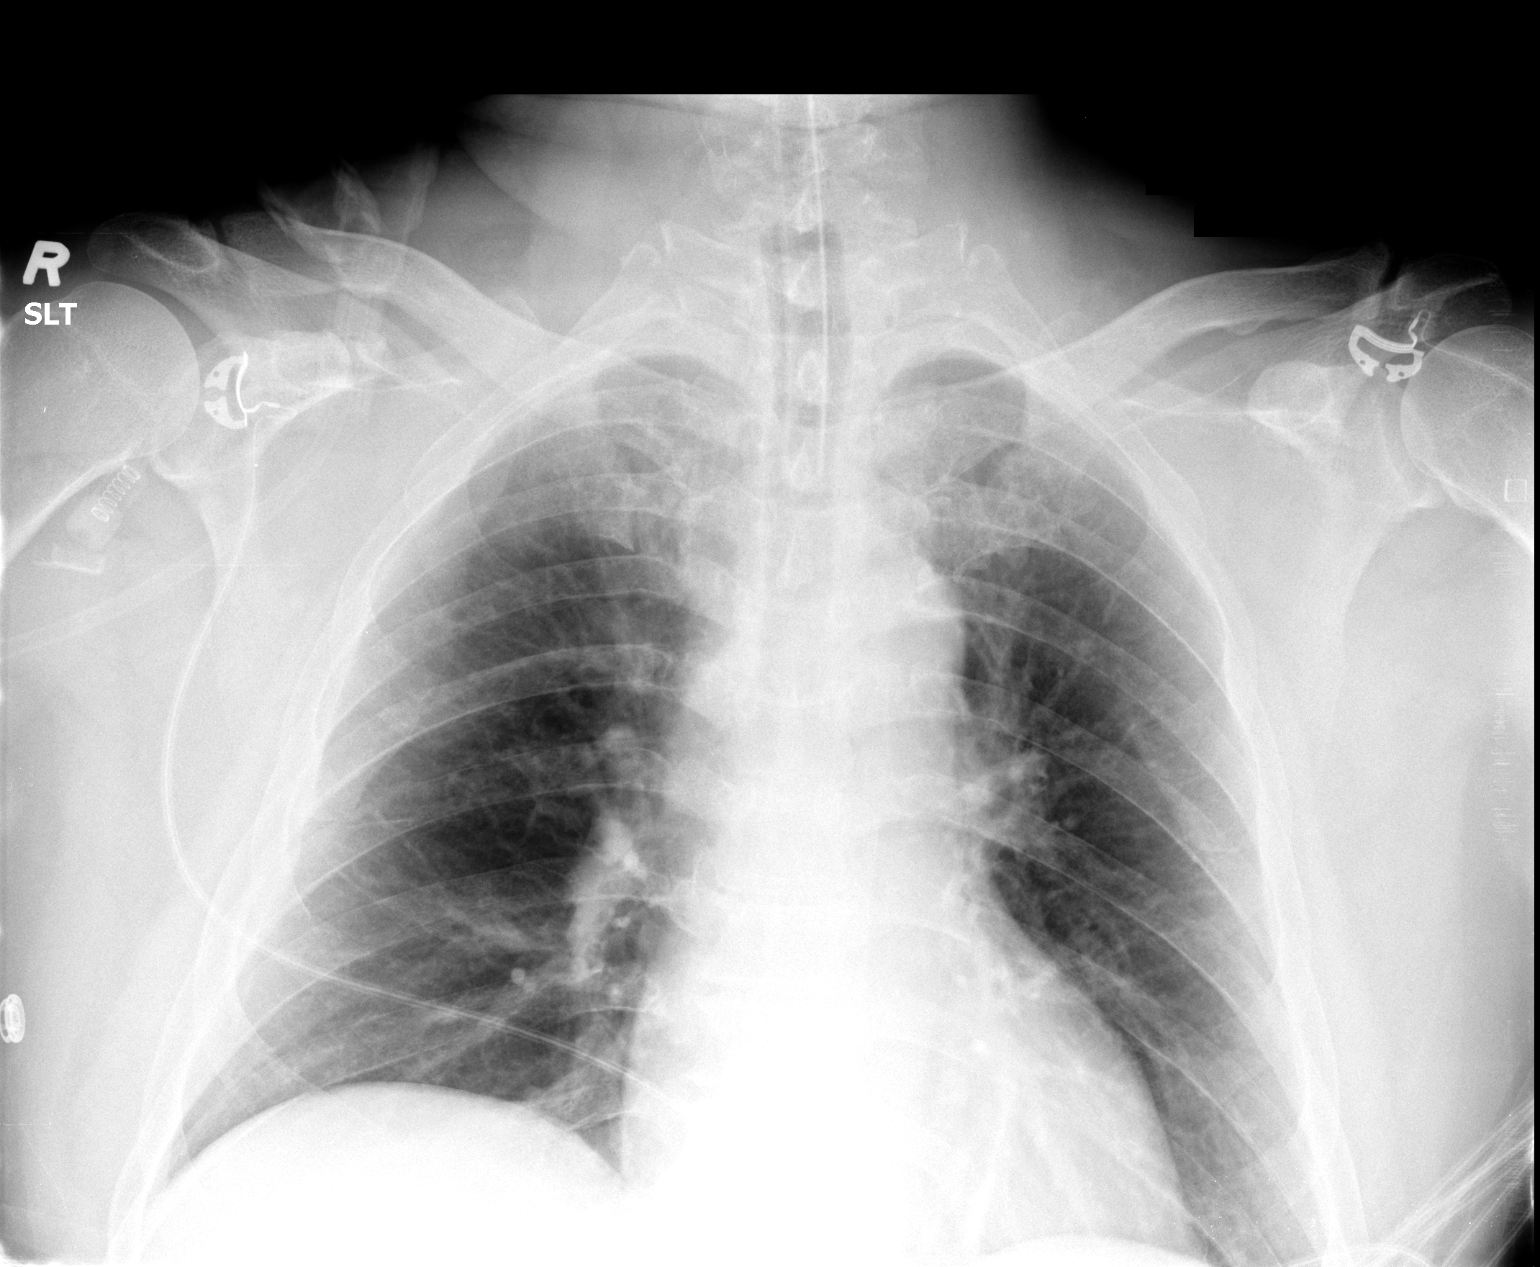

[1 of 1 positions shown; findings below may reference images not displayed]

FINDINGS: Endotracheal tube tip is 6.5 cm above the carina.

The left costophrenic angle is excluded. Cardiac and mediastinal
contours appear unremarkable.

Subsegmental retrocardiac atelectasis noted.
IMPRESSION: 1.  Endotracheal tube tip is 6.5 cm above the carina.  Nasogastric
tube has been removed.
2.  Retrocardiac segmental atelectasis.

## 2011-11-02 IMAGING — CR DG CHEST 1V PORT
1 series · 1 of 1 positions shown · non-contrast
Comparison: 12/12/2006

CLINICAL DATA: Post intubation.  Syncope.

PORTABLE CHEST - 1 VIEW

[view not recorded]
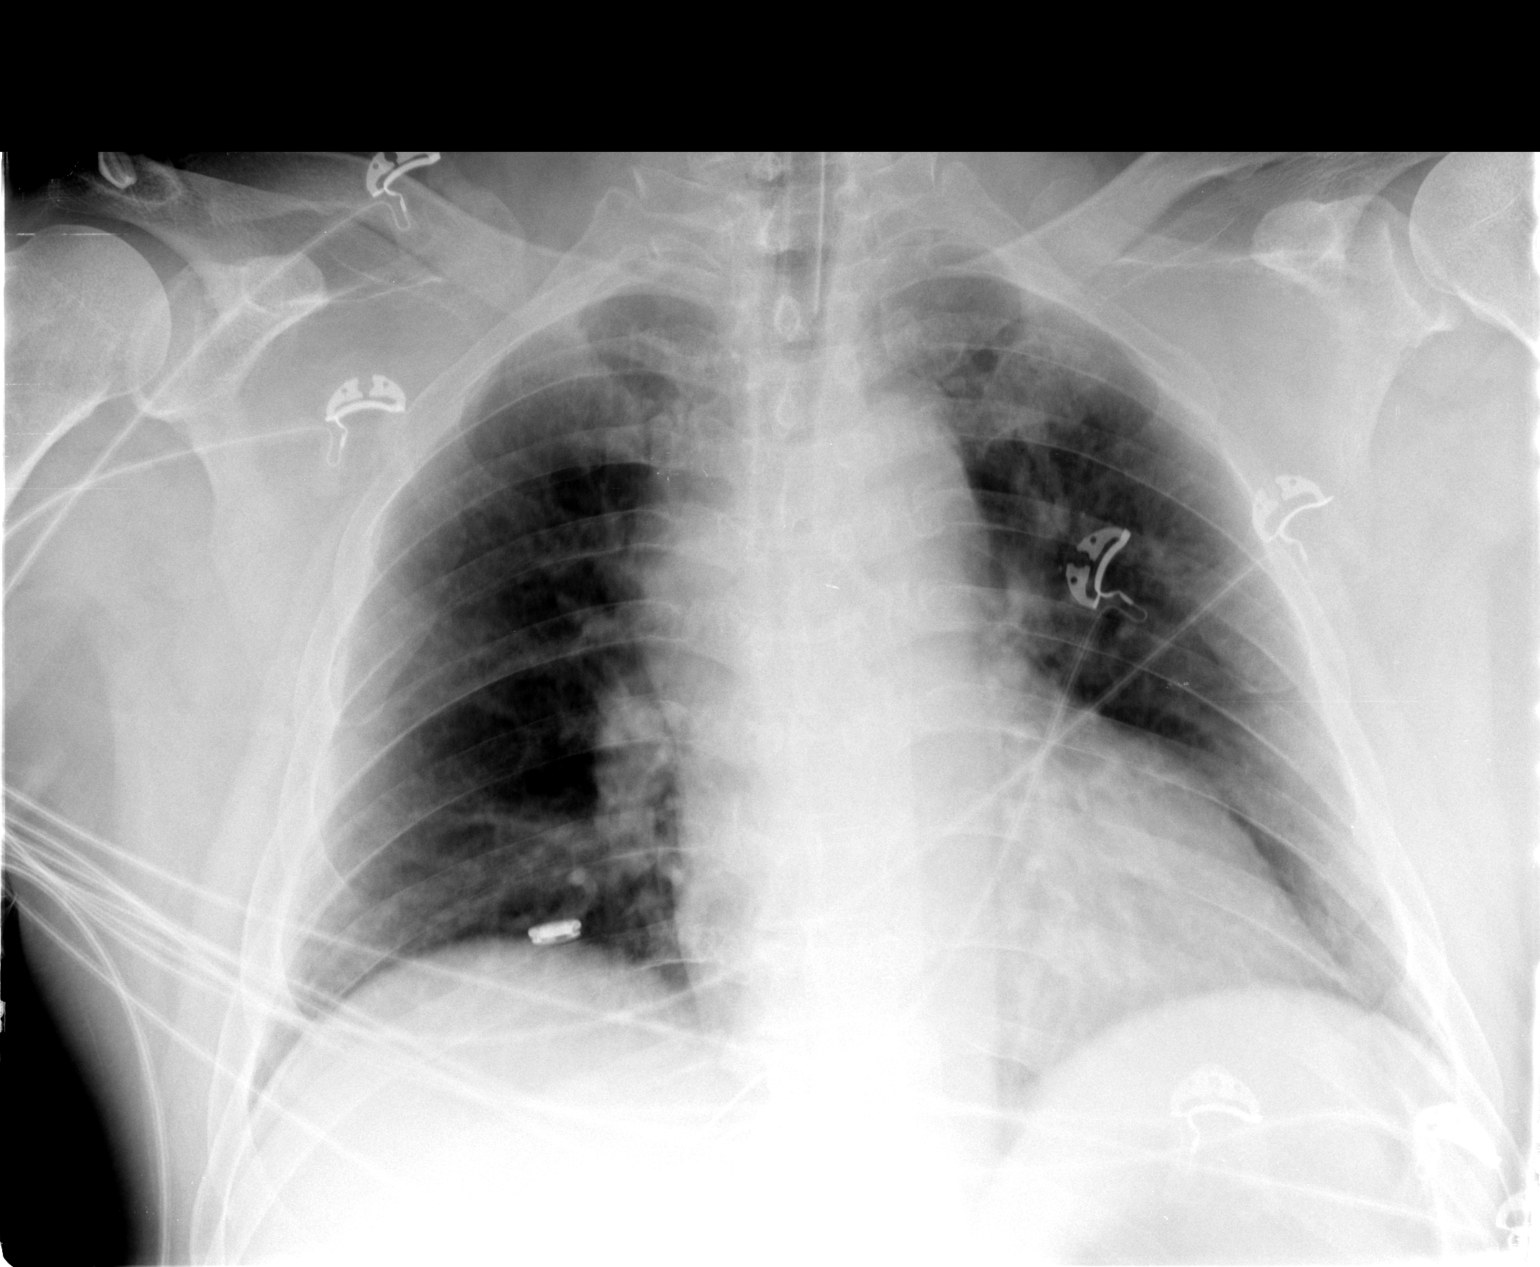

[1 of 1 positions shown; findings below may reference images not displayed]

FINDINGS: Endotracheal tube terminates at the level of the
clavicles.  Heart size is accentuated by technique.  Image quality
is degraded by motion.  Lungs are grossly clear.
IMPRESSION: Endotracheal tube terminates at the level of the clavicles.

## 2012-04-06 ENCOUNTER — Other Ambulatory Visit (HOSPITAL_COMMUNITY): Payer: Self-pay | Admitting: Cardiovascular Disease

## 2012-04-06 ENCOUNTER — Ambulatory Visit (HOSPITAL_COMMUNITY)
Admission: RE | Admit: 2012-04-06 | Discharge: 2012-04-06 | Disposition: A | Discharge status: Home / Self Care | Payer: Managed Care, Other (non HMO) | Source: Ambulatory Visit | Attending: Internal Medicine | Admitting: Internal Medicine

## 2012-04-06 DIAGNOSIS — I714 Abdominal aortic aneurysm, without rupture, unspecified: Secondary | ICD-10-CM

## 2012-04-06 DIAGNOSIS — I739 Peripheral vascular disease, unspecified: Secondary | ICD-10-CM

## 2012-04-06 NOTE — Progress Notes (Signed)
Lower extremity arterial Duplex completed. Almira Coaster Stephany Poorman RVT 04/06/12

## 2012-07-04 ENCOUNTER — Encounter (INDEPENDENT_AMBULATORY_CARE_PROVIDER_SITE_OTHER): Payer: Managed Care, Other (non HMO) | Admitting: Ophthalmology

## 2012-07-04 DIAGNOSIS — H2510 Age-related nuclear cataract, unspecified eye: Secondary | ICD-10-CM

## 2012-07-04 DIAGNOSIS — E11319 Type 2 diabetes mellitus with unspecified diabetic retinopathy without macular edema: Secondary | ICD-10-CM

## 2012-07-04 DIAGNOSIS — E1165 Type 2 diabetes mellitus with hyperglycemia: Secondary | ICD-10-CM

## 2012-07-04 DIAGNOSIS — H35039 Hypertensive retinopathy, unspecified eye: Secondary | ICD-10-CM

## 2012-07-04 DIAGNOSIS — I10 Essential (primary) hypertension: Secondary | ICD-10-CM

## 2012-07-04 DIAGNOSIS — H43819 Vitreous degeneration, unspecified eye: Secondary | ICD-10-CM

## 2012-08-15 LAB — PACEMAKER DEVICE OBSERVATION

## 2012-09-11 ENCOUNTER — Encounter: Payer: Self-pay | Admitting: *Deleted

## 2012-10-07 ENCOUNTER — Other Ambulatory Visit (HOSPITAL_COMMUNITY): Payer: Self-pay | Admitting: Internal Medicine

## 2012-10-09 NOTE — Telephone Encounter (Signed)
Rx was sent to pharmacy electronically. 

## 2012-11-08 ENCOUNTER — Encounter: Payer: Self-pay | Admitting: Internal Medicine

## 2012-11-08 ENCOUNTER — Ambulatory Visit (INDEPENDENT_AMBULATORY_CARE_PROVIDER_SITE_OTHER): Payer: Managed Care, Other (non HMO) | Admitting: Internal Medicine

## 2012-11-08 VITALS — BP 108/86 | HR 99 | Ht 70.0 in | Wt 240.0 lb

## 2012-11-08 DIAGNOSIS — E785 Hyperlipidemia, unspecified: Secondary | ICD-10-CM

## 2012-11-08 DIAGNOSIS — E119 Type 2 diabetes mellitus without complications: Secondary | ICD-10-CM

## 2012-11-08 DIAGNOSIS — Z720 Tobacco use: Secondary | ICD-10-CM

## 2012-11-08 DIAGNOSIS — I471 Supraventricular tachycardia: Secondary | ICD-10-CM

## 2012-11-08 DIAGNOSIS — I739 Peripheral vascular disease, unspecified: Secondary | ICD-10-CM

## 2012-11-08 DIAGNOSIS — I714 Abdominal aortic aneurysm, without rupture, unspecified: Secondary | ICD-10-CM

## 2012-11-08 DIAGNOSIS — I499 Cardiac arrhythmia, unspecified: Secondary | ICD-10-CM

## 2012-11-08 DIAGNOSIS — F17200 Nicotine dependence, unspecified, uncomplicated: Secondary | ICD-10-CM

## 2012-11-08 DIAGNOSIS — I10 Essential (primary) hypertension: Secondary | ICD-10-CM

## 2012-11-08 DIAGNOSIS — I498 Other specified cardiac arrhythmias: Secondary | ICD-10-CM

## 2012-11-08 DIAGNOSIS — R55 Syncope and collapse: Secondary | ICD-10-CM

## 2012-11-08 DIAGNOSIS — G4733 Obstructive sleep apnea (adult) (pediatric): Secondary | ICD-10-CM

## 2012-11-08 NOTE — Patient Instructions (Addendum)
Your physician wants you to follow-up in: 1 year. You will receive a reminder letter in the mail two months in advance. If you don't receive a letter, please call our office to schedule the follow-up appointment.  

## 2012-11-08 NOTE — Progress Notes (Signed)
OFFICE NOTE  Chief Complaint:  Routine office visit  Primary Care Physician: Delorse Lek, MD  HPI:  Dustin Ines. 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 He underwent explantation and permanent pacemaker placement in January of 2013 and has done fairly well since then and has had no further syncopal events. He also has an abdominal aortic aneurysm which was recently reassessed by ultrasound and is fairly stable, measuring 4.1 x 4.3 cm. Unfortunately he has bilateral lower extremity arterial disease with SFA narrowing of 50-69%. His right ABI is 0.84, his left ABI is 0.76. At this point he is not having any symptoms of claudication, however, we recommended we repeat the studies in 1 year. Unfortunately he also has had continued tobacco dependence and smoking now 3 packs a day. We discussed that this probably amounts to about $100 a week, or $400 a month, which is approximately one-third of his monthly Social Security check. Despite that he continues to smoke. Finally his blood pressure was noted to be low today, measuring 80/56 and a third recheck indicated it was about 85/56.  At his last visit I decreased his blood pressure medications and he reported his blood pressure is now better. He also saw Dr. Royann Shivers recently who felt like his pacemaker was working appropriately.  PMHx:  Past Medical History  Diagnosis Date  . Dysrhythmia   . Diabetes mellitus   . Sleep apnea     Past Surgical History  Procedure Laterality Date  . Insert / replace / remove pacemaker    . Brain surgery      FAMHx:  No family history on file.  SOCHx:   reports that he has been smoking Cigarettes.  He has a 117.5 pack-year smoking history. He has never used smokeless tobacco. He reports that  drinks alcohol. He reports that he does not use illicit drugs.  ALLERGIES:  No Known Allergies  ROS: A  comprehensive review of systems was negative except for: Constitutional: positive for weight gain Behavioral/Psych: positive for depression and tobacco use  HOME MEDS: Current Outpatient Prescriptions  Medication Sig Dispense Refill  . aspirin EC 325 MG tablet Take 325 mg by mouth at bedtime.        Marland Kitchen diltiazem (CARDIZEM CD) 360 MG 24 hr capsule Take 360 mg by mouth every morning.        . fenofibrate 160 MG tablet Take 160 mg by mouth at bedtime.        . Flaxseed, Linseed, 1000 MG CAPS Take 1 capsule by mouth 2 (two) times daily.        Marland Kitchen glipiZIDE (GLUCOTROL) 10 MG tablet Take 10 mg by mouth every morning.        . metFORMIN (GLUCOPHAGE) 500 MG tablet Take 2 tablets (1,000 mg total) by mouth 2 (two) times daily with a meal.      . metoprolol tartrate (LOPRESSOR) 12.5 mg TABS Take 25 mg by mouth daily.      . Multiple Vitamin (MULITIVITAMIN WITH MINERALS) TABS Take 1 tablet by mouth at bedtime. Centrum Silver      . niacin 500 MG tablet Take 500 mg by mouth 2 (two) times daily with a meal.        . quinapril (ACCUPRIL) 20 MG tablet TAKE ONE TABLET BY MOUTH DAILY  30 tablet  8  . simvastatin (ZOCOR) 40 MG tablet Take 40 mg by mouth every  morning.         No current facility-administered medications for this visit.    LABS/IMAGING: No results found for this or any previous visit (from the past 48 hour(s)). No results found.  VITALS: BP 108/86  Pulse 99  Ht 5\' 10"  (1.778 m)  Wt 240 lb (108.863 kg)  BMI 34.44 kg/m2  EXAM: General appearance: alert and no distress Neck: no adenopathy, no carotid bruit, no JVD, supple, symmetrical, trachea midline and thyroid not enlarged, symmetric, no tenderness/mass/nodules Lungs: clear to auscultation bilaterally Heart: regular rate and rhythm, S1, S2 normal, no murmur, click, rub or gallop Abdomen: soft, non-tender; bowel sounds normal; no masses,  no organomegaly and obese Extremities: extremities normal, atraumatic, no cyanosis or  edema Pulses: 2+ and symmetric Skin: Skin color, texture, turgor normal. No rashes or lesions Neurologic: Grossly normal  EKG: Normal sinus rhythm at 99 with a right bundle branch block  ASSESSMENT: 1. Syncope status post dual-chamber pacemaker 2. Obstructive sleep apnea 3. Obesity 4. Diabetes type 2 5. Dyslipidemia 6. Hypertension 7. PAD 8. Stable aortic aneurysm measuring 4.1 x 4.3 cm 9. Tobacco dependence  PLAN: 1.   Dustin Estes is to be doing fairly well without any further chest pain or worsening shortness of breath. Unfortunately since he stopped working he is not as active as he used to be. He did say but some chickens encases around the yard, but is not physically active. He has had about 22 pound weight gain I have counseled him about changing his diet working on some exercise to lose that weight. We did spend more than 5 minutes today talking about smoking cessation waist he can continue to decrease his dependence on tobacco as well as help with some of his savings. Plan to see him back in earlier or sooner as necessary. He will have recurrent annual Dopplers of the abdominal aorta and peripheral arteries in December.  Chrystie Nose, MD, [facility] Attending Cardiologist [facility]  Dustin Estes,Dustin Estes 11/08/2012, 4:16 PM

## 2012-12-03 ENCOUNTER — Ambulatory Visit (HOSPITAL_COMMUNITY)
Admission: RE | Admit: 2012-12-03 | Discharge: 2012-12-03 | Disposition: A | Discharge status: Home / Self Care | Payer: Managed Care, Other (non HMO) | Source: Ambulatory Visit | Attending: Internal Medicine | Admitting: Internal Medicine

## 2012-12-03 DIAGNOSIS — I70219 Atherosclerosis of native arteries of extremities with intermittent claudication, unspecified extremity: Secondary | ICD-10-CM

## 2012-12-03 DIAGNOSIS — I739 Peripheral vascular disease, unspecified: Secondary | ICD-10-CM | POA: Insufficient documentation

## 2012-12-03 NOTE — Progress Notes (Signed)
Arterial Duplex Completed. Geneve Kimpel, BS, RDMS, RVT  

## 2012-12-10 ENCOUNTER — Encounter: Payer: Self-pay | Admitting: Internal Medicine

## 2012-12-10 ENCOUNTER — Ambulatory Visit (INDEPENDENT_AMBULATORY_CARE_PROVIDER_SITE_OTHER): Payer: Managed Care, Other (non HMO) | Admitting: Internal Medicine

## 2012-12-10 ENCOUNTER — Encounter (HOSPITAL_COMMUNITY): Payer: Managed Care, Other (non HMO)

## 2012-12-10 VITALS — BP 124/68 | HR 88 | Ht 68.0 in | Wt 241.0 lb

## 2012-12-10 DIAGNOSIS — I739 Peripheral vascular disease, unspecified: Secondary | ICD-10-CM | POA: Insufficient documentation

## 2012-12-10 DIAGNOSIS — I714 Abdominal aortic aneurysm, without rupture, unspecified: Secondary | ICD-10-CM

## 2012-12-10 DIAGNOSIS — Z950 Presence of cardiac pacemaker: Secondary | ICD-10-CM

## 2012-12-10 HISTORY — DX: Abdominal aortic aneurysm, without rupture, unspecified: I71.40

## 2012-12-10 HISTORY — DX: Abdominal aortic aneurysm, without rupture: I71.4

## 2012-12-10 NOTE — Progress Notes (Signed)
OFFICE NOTE  Chief Complaint:  Routine office visit  Primary Care Physician: Delorse Lek, MD  HPI:  Dustin Estes. 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 He underwent explantation and permanent pacemaker placement in January of 2013 and has done fairly well since then and has had no further syncopal events. He also has an abdominal aortic aneurysm which was recently reassessed by ultrasound and is fairly stable, measuring 4.1 x 4.3 cm. Unfortunately he has bilateral lower extremity arterial disease with SFA narrowing of 50-69%. His right ABI is 0.84, his left ABI is 0.76. At this point he is not having any symptoms of claudication, however, we recommended we repeat the studies in 1 year. Unfortunately he also has had continued tobacco dependence and smoking now 3 packs a day. We discussed that this probably amounts to about $100 a week, or $400 a month, which is approximately one-third of his monthly Social Security check. Despite that he continues to smoke. Finally his blood pressure was noted to be low today, measuring 80/56 and a third recheck indicated it was about 85/56.  At his last visit I decreased his blood pressure medications and he reported his blood pressure is now better. He also saw Dr. Royann Shivers recently who felt like his pacemaker was working appropriately.  He returns today for followup on his arterial and abdominal Dopplers. Dopplers indicated a stable fusiform aneurysm measuring 4.1 cm x 4.3 cm of the infrarenal aorta. He also has bilateral PAD with reduced ABIs of 0.8 which is stable, without lifestyle-limiting claudication. He reports that he has been chasing around his chickens in the yard and has had no problems with pain in his legs.  PMHx:  Past Medical History  Diagnosis Date  . Dysrhythmia   . Diabetes mellitus   . Sleep apnea     Past Surgical History  Procedure  Laterality Date  . Insert / replace / remove pacemaker    . Brain surgery      FAMHx:  No family history on file.  SOCHx:   reports that he has been smoking Cigarettes.  He has a 94 pack-year smoking history. He has never used smokeless tobacco. He reports that  drinks alcohol. He reports that he does not use illicit drugs.  ALLERGIES:  No Known Allergies  ROS: A comprehensive review of systems was negative except for: Constitutional: positive for weight gain Behavioral/Psych: positive for depression and tobacco use  HOME MEDS: Current Outpatient Prescriptions  Medication Sig Dispense Refill  . aspirin EC 325 MG tablet Take 325 mg by mouth at bedtime.        Marland Kitchen diltiazem (CARDIZEM CD) 360 MG 24 hr capsule Take 360 mg by mouth every morning.        . fenofibrate 160 MG tablet Take 160 mg by mouth at bedtime.        . Flaxseed, Linseed, 1000 MG CAPS Take 1 capsule by mouth 2 (two) times daily.        Marland Kitchen glipiZIDE (GLUCOTROL) 10 MG tablet Take 10 mg by mouth every morning.        . metFORMIN (GLUCOPHAGE) 500 MG tablet Take 2 tablets (1,000 mg total) by mouth 2 (two) times daily with a meal.      . metoprolol tartrate (LOPRESSOR) 12.5 mg TABS Take 25 mg by mouth daily.      . Multiple Vitamin (MULITIVITAMIN WITH MINERALS) TABS  Take 1 tablet by mouth at bedtime. Centrum Silver      . niacin 500 MG tablet Take 500 mg by mouth 2 (two) times daily with a meal.        . quinapril (ACCUPRIL) 20 MG tablet TAKE ONE TABLET BY MOUTH DAILY  30 tablet  8  . simvastatin (ZOCOR) 40 MG tablet Take 40 mg by mouth every morning.         No current facility-administered medications for this visit.    LABS/IMAGING: No results found for this or any previous visit (from the past 48 hour(s)). No results found.  VITALS: BP 124/68  Pulse 88  Ht 5\' 8"  (1.727 m)  Wt 241 lb (109.317 kg)  BMI 36.65 kg/m2  EXAM: Deferred  EKG: deferred  ASSESSMENT: 1. Syncope status post dual-chamber  pacemaker 2. Obstructive sleep apnea 3. Obesity 4. Diabetes type 2 5. Dyslipidemia 6. Hypertension 7. PAD - Bilateral ABI's at 0.8 8. Stable aortic aneurysm measuring 4.1 x 4.3 cm 9. Tobacco dependence  PLAN: 1.   Dustin Estes is to be doing fairly well without any further chest pain or worsening shortness of breath. He's had no further syncopal episodes since the placement of his pacemaker. He does have a stable infrarenal aneurysm which is unchanged. He also has bilateral PAD without lifestyle limiting claudication. I again encouraged him to stop smoking and spent more than 5 minutes talking to him about smoking cessation. He again reassures me that he would be working on it. We'll plan to see him back annually and he'll be followed by Dr. Salena Saner for his pacemaker.  Dustin Nose, MD, [facility] Attending Cardiologist [facility]  Dustin Estes,Dustin Estes 12/10/2012, 3:41 PM

## 2012-12-10 NOTE — Patient Instructions (Addendum)
Your physician wants you to follow-up in: 1 year. You will receive a reminder letter in the mail two months in advance. If you don't receive a letter, please call our office to schedule the follow-up appointment.  

## 2012-12-26 ENCOUNTER — Encounter: Payer: Self-pay | Admitting: *Deleted

## 2013-01-09 ENCOUNTER — Other Ambulatory Visit: Payer: Self-pay | Admitting: *Deleted

## 2013-01-09 MED ORDER — QUINAPRIL HCL 20 MG PO TABS: ORAL_TABLET | ORAL | Status: DC

## 2013-01-09 NOTE — Telephone Encounter (Signed)
Rx was sent to pharmacy electronically. 

## 2013-03-12 ENCOUNTER — Telehealth (HOSPITAL_COMMUNITY): Payer: Self-pay | Admitting: *Deleted

## 2013-04-19 ENCOUNTER — Encounter: Payer: Self-pay | Admitting: Internal Medicine

## 2013-05-23 ENCOUNTER — Encounter: Payer: Self-pay | Admitting: *Deleted

## 2013-05-23 ENCOUNTER — Ambulatory Visit (INDEPENDENT_AMBULATORY_CARE_PROVIDER_SITE_OTHER): Payer: Managed Care, Other (non HMO) | Admitting: Cardiovascular Disease

## 2013-05-23 ENCOUNTER — Encounter: Payer: Self-pay | Admitting: Cardiovascular Disease

## 2013-05-23 VITALS — BP 102/68 | HR 86 | Resp 16 | Ht 68.0 in | Wt 239.6 lb

## 2013-05-23 DIAGNOSIS — Z950 Presence of cardiac pacemaker: Secondary | ICD-10-CM

## 2013-05-23 DIAGNOSIS — Z720 Tobacco use: Secondary | ICD-10-CM

## 2013-05-23 DIAGNOSIS — I498 Other specified cardiac arrhythmias: Secondary | ICD-10-CM

## 2013-05-23 DIAGNOSIS — F17200 Nicotine dependence, unspecified, uncomplicated: Secondary | ICD-10-CM

## 2013-05-23 DIAGNOSIS — I471 Supraventricular tachycardia: Secondary | ICD-10-CM

## 2013-05-23 DIAGNOSIS — I441 Atrioventricular block, second degree: Secondary | ICD-10-CM

## 2013-05-23 LAB — PACEMAKER DEVICE OBSERVATION

## 2013-05-23 NOTE — Patient Instructions (Signed)
Your physician recommends that you schedule a follow-up appointment in: 3 Months for pacemaker interrogation w/Shakila.

## 2013-05-24 LAB — MDC_IDC_ENUM_SESS_TYPE_INCLINIC
Battery Impedance: 137 Ohm
Battery Remaining Longevity: 13.5
Brady Statistic AP VP Percent: 0.2 %
Brady Statistic AS VP Percent: 0.5 %
Lead Channel Impedance Value: 475 Ohm
Lead Channel Impedance Value: 926 Ohm
Lead Channel Pacing Threshold Pulse Width: 0.4 ms
Lead Channel Sensing Intrinsic Amplitude: 1.4 mV
Lead Channel Setting Pacing Amplitude: 1.5 V
Lead Channel Setting Pacing Amplitude: 2 V
Lead Channel Setting Sensing Sensitivity: 4 mV
MDC IDC MSMT BATTERY VOLTAGE: 2.78 V
MDC IDC MSMT LEADCHNL RA PACING THRESHOLD AMPLITUDE: 0.5 V
MDC IDC MSMT LEADCHNL RA PACING THRESHOLD PULSEWIDTH: 0.4 ms
MDC IDC MSMT LEADCHNL RV PACING THRESHOLD AMPLITUDE: 0.5 V
MDC IDC MSMT LEADCHNL RV SENSING INTR AMPL: 8 mV
MDC IDC SET LEADCHNL RV PACING PULSEWIDTH: 0.4 ms
MDC IDC STAT BRADY AP VS PERCENT: 1.5 %
MDC IDC STAT BRADY AS VS PERCENT: 97.8 %

## 2013-05-24 NOTE — Progress Notes (Signed)
Patient ID: [name]., male   DOB: [birth year], 63 y.o.   MRN: [id number]     Reason for office visit Pacemaker followup, PAD, OSA  Mr. Henes is a 63 year old man with a history of syncope secondary to high-grade second-degree AV block he received a dual-chamber Medtronic pacemaker in January of 2013. He does not have a known coronary disease but does have of established peripheral arterial disease with a moderate size abdominal aortic aneurysm and bilateral superficial femoral artery stenoses. He is obese and has sleep apnea and continues to smoke cigarettes. He does not have any complaints today.  His pacemaker is functioning normally. He only paces the ventricle roughly 3% of the time.  No Known Allergies  Current Outpatient Prescriptions  Medication Sig Dispense Refill  . aspirin EC 325 MG tablet Take 325 mg by mouth at bedtime.        Marland Kitchen diltiazem (CARDIZEM CD) 360 MG 24 hr capsule Take 360 mg by mouth every morning.        . fenofibrate 160 MG tablet Take 160 mg by mouth at bedtime.        . Flaxseed, Linseed, 1000 MG CAPS Take 1 capsule by mouth 2 (two) times daily.        Marland Kitchen glipiZIDE (GLUCOTROL) 10 MG tablet Take 10 mg by mouth every morning.        . metFORMIN (GLUCOPHAGE) 500 MG tablet Take 2 tablets (1,000 mg total) by mouth 2 (two) times daily with a meal.      . metoprolol tartrate (LOPRESSOR) 12.5 mg TABS Take 25 mg by mouth daily.      . Multiple Vitamin (MULITIVITAMIN WITH MINERALS) TABS Take 1 tablet by mouth at bedtime. Centrum Silver      . niacin 500 MG tablet Take 500 mg by mouth 2 (two) times daily with a meal.        . quinapril (ACCUPRIL) 20 MG tablet TAKE ONE TABLET BY MOUTH DAILY  30 tablet  11  . simvastatin (ZOCOR) 40 MG tablet Take 40 mg by mouth every morning.         No current facility-administered medications for this visit.    Past Medical History  Diagnosis Date  . Dysrhythmia   . Diabetes mellitus   . Sleep apnea   . 2Nd degree AV block  04/29/2011    Medtronic Adapta  . PAD (peripheral artery disease)   . Abdominal aortic aneurysm     4.1 x 4.3 cm  . Hypertension   . Obesity   . Tobacco abuse   . OSA (obstructive sleep apnea)     Past Surgical History  Procedure Laterality Date  . Permanent pacemaker insertion  04/29/2011    Medtronic Adapta  . Brain surgery    . Appendectomy  12/69  . Bladder surgery  9/04,11/05,9/07    tumor removal  . US echocardiography  04/13/2010    mild asymmetric LVH, EF >55%,mild mitral ca+,AOV mildly sclerotic  . Nm myocar perf wall motion  04/13/2010    moderate, mostly fixed  inferoseptal defect, suspicious for artifact.    Family History  Problem Relation Age of Onset  . Diabetes Mother   . Diabetes Father   . Stroke Father     History   Social History  . Marital Status: Divorced    Spouse Name: N/A    Number of Children: N/A  . Years of Education: N/A   Occupational History  . Not on file.  Social History Main Topics  . Smoking status: Current Every Day Smoker -- 2.00 packs/day for 47 years    Types: Cigarettes  . Smokeless tobacco: Never Used  . Alcohol Use: Yes     Comment: "hardly ever"  . Drug Use: No  . Sexual Activity: No   Other Topics Concern  . Not on file   Social History Narrative  . No narrative on file    Review of systems: The patient specifically denies any chest pain at rest or with exertion, dyspnea at rest or with exertion, orthopnea, paroxysmal nocturnal dyspnea, syncope, palpitations, focal neurological deficits, intermittent claudication, lower extremity edema, unexplained weight gain, cough, hemoptysis or wheezing.  The patient also denies abdominal pain, nausea, vomiting, dysphagia, diarrhea, constipation, polyuria, polydipsia, dysuria, hematuria, frequency, urgency, abnormal bleeding or bruising, fever, chills, unexpected weight changes, mood swings, change in skin or hair texture, change in voice quality, auditory or visual problems,  allergic reactions or rashes, new musculoskeletal complaints other than usual "aches and pains".   PHYSICAL EXAM BP 102/68  Pulse 86  Resp 16  Ht 5\' 8"  (1.727 m)  Wt 108.682 kg (239 lb 9.6 oz)  BMI 36.44 kg/m2  General: Alert, oriented x3, no distress; severe obesity limits physical exam evaluation Head: no evidence of trauma, PERRL, EOMI, no exophtalmos or lid lag, no myxedema, no xanthelasma; normal ears, nose and oropharynx Neck: normal jugular venous pulsations and no hepatojugular reflux; brisk carotid pulses without delay and no carotid bruits Chest: clear to auscultation, no signs of consolidation by percussion or palpation, normal fremitus, symmetrical and full respiratory excursions Cardiovascular: normal position and quality of the apical impulse, regular rhythm, normal first and widely split second heart sounds, no murmurs, rubs or gallops Abdomen: no tenderness or distention, no masses by palpation, no abnormal pulsatility or arterial bruits, normal bowel sounds, no hepatosplenomegaly Extremities: no clubbing, cyanosis or edema; 2+ radial, ulnar and brachial pulses bilaterally; 2+ right femoral, 1+ posterior tibial and dorsalis pedis pulses; 2+ left femoral, 1+ posterior tibial and dorsalis pedis pulses; no subclavian or femoral bruits Neurological: grossly nonfocal   EKG: Sinus rhythm, right bundle branch block, left anterior fascicular block   BMET    Component Value Date/Time   NA 135 01/29/2010 0725   K 4.0 01/29/2010 0725   CL 101 01/29/2010 0725   CO2 29 01/29/2010 0725   GLUCOSE 97 01/29/2010 0725   BUN 12 01/29/2010 0725   CREATININE 1.02 04/29/2011 2038   CALCIUM 9.0 01/29/2010 0725   GFRNONAA 77* 04/29/2011 2038   GFRAA 90* 04/29/2011 2038     ASSESSMENT AND PLAN Pacemaker Pacemaker check in clinic. Normal device function. Thresholds, sensing, impedances consistent with previous measurements. Device programmed to maximize longevity. 5 mode switches (all  <30 sec/ no EGMs). 4 high ventricular rates noted---max dur. 9 sec, Max V 197---SVT. Device programmed at appropriate safety margins. Histogram distribution appropriate for patient activity level. Device programmed to optimize intrinsic conduction. Estimated longevity 13.5 years.  Tobacco abuse Encouraged him to quit smoking. He shows no interest at this time.   Orders Placed This Encounter  Procedures  . Implantable device check  . EKG 12-Lead   Patient Instructions  Your physician recommends that you schedule a follow-up appointment in: 3 Months for pacemaker interrogation w/Shakila.     Holli Humbles, MD, [place] [phone] office [phone] pager

## 2013-06-01 ENCOUNTER — Encounter: Payer: Self-pay | Admitting: Cardiovascular Disease

## 2013-06-01 NOTE — Assessment & Plan Note (Signed)
Encouraged him to quit smoking. He shows no interest at this time.

## 2013-06-01 NOTE — Assessment & Plan Note (Signed)
Pacemaker check in clinic. Normal device function. Thresholds, sensing, impedances consistent with previous measurements. Device programmed to maximize longevity. 5 mode switches (all <30 sec/ no EGMs). 4 high ventricular rates noted---max dur. 9 sec, Max V 197---SVT. Device programmed at appropriate safety margins. Histogram distribution appropriate for patient activity level. Device programmed to optimize intrinsic conduction. Estimated longevity 13.5 years.

## 2013-08-28 ENCOUNTER — Encounter: Payer: Self-pay | Admitting: *Deleted

## 2013-09-30 ENCOUNTER — Encounter: Payer: Self-pay | Admitting: *Deleted

## 2013-11-12 ENCOUNTER — Other Ambulatory Visit (HOSPITAL_COMMUNITY): Payer: Self-pay | Admitting: Internal Medicine

## 2013-11-12 DIAGNOSIS — I739 Peripheral vascular disease, unspecified: Secondary | ICD-10-CM

## 2013-11-12 DIAGNOSIS — I714 Abdominal aortic aneurysm, without rupture, unspecified: Secondary | ICD-10-CM

## 2013-11-20 ENCOUNTER — Ambulatory Visit (HOSPITAL_BASED_OUTPATIENT_CLINIC_OR_DEPARTMENT_OTHER)
Admission: RE | Admit: 2013-11-20 | Discharge: 2013-11-20 | Disposition: A | Discharge status: Home / Self Care | Payer: Managed Care, Other (non HMO) | Source: Ambulatory Visit | Attending: Cardiology | Admitting: Cardiology

## 2013-11-20 ENCOUNTER — Ambulatory Visit (HOSPITAL_COMMUNITY)
Admission: RE | Admit: 2013-11-20 | Discharge: 2013-11-20 | Disposition: A | Discharge status: Home / Self Care | Payer: Managed Care, Other (non HMO) | Source: Ambulatory Visit | Attending: Cardiology | Admitting: Cardiology

## 2013-11-20 DIAGNOSIS — I714 Abdominal aortic aneurysm, without rupture, unspecified: Secondary | ICD-10-CM

## 2013-11-20 DIAGNOSIS — I70219 Atherosclerosis of native arteries of extremities with intermittent claudication, unspecified extremity: Secondary | ICD-10-CM

## 2013-11-20 DIAGNOSIS — I739 Peripheral vascular disease, unspecified: Secondary | ICD-10-CM

## 2013-11-20 NOTE — Progress Notes (Signed)
Arterial Duplex Lower Ext. Completed. Dustin Estes, BS, RDMS, RVT  

## 2013-11-20 NOTE — Progress Notes (Signed)
Abdominal Aorta Completed. Oda Cogan, BS, RDMS, RVT

## 2013-11-27 NOTE — Progress Notes (Signed)
LMTCB for test results 

## 2013-12-14 ENCOUNTER — Other Ambulatory Visit: Payer: Self-pay | Admitting: Internal Medicine

## 2013-12-16 NOTE — Telephone Encounter (Signed)
Rx refill sent to patient pharmacy   

## 2014-01-13 ENCOUNTER — Encounter: Payer: Self-pay | Admitting: Internal Medicine

## 2014-01-13 ENCOUNTER — Ambulatory Visit (INDEPENDENT_AMBULATORY_CARE_PROVIDER_SITE_OTHER): Payer: Managed Care, Other (non HMO) | Admitting: Internal Medicine

## 2014-01-13 VITALS — BP 121/75 | HR 83 | Ht 68.0 in | Wt 236.2 lb

## 2014-01-13 DIAGNOSIS — I471 Supraventricular tachycardia: Secondary | ICD-10-CM

## 2014-01-13 DIAGNOSIS — I714 Abdominal aortic aneurysm, without rupture, unspecified: Secondary | ICD-10-CM

## 2014-01-13 DIAGNOSIS — I10 Essential (primary) hypertension: Secondary | ICD-10-CM

## 2014-01-13 DIAGNOSIS — Z720 Tobacco use: Secondary | ICD-10-CM

## 2014-01-13 DIAGNOSIS — E785 Hyperlipidemia, unspecified: Secondary | ICD-10-CM

## 2014-01-13 DIAGNOSIS — G4733 Obstructive sleep apnea (adult) (pediatric): Secondary | ICD-10-CM

## 2014-01-13 DIAGNOSIS — Z950 Presence of cardiac pacemaker: Secondary | ICD-10-CM

## 2014-01-13 DIAGNOSIS — I498 Other specified cardiac arrhythmias: Secondary | ICD-10-CM

## 2014-01-13 DIAGNOSIS — F17200 Nicotine dependence, unspecified, uncomplicated: Secondary | ICD-10-CM

## 2014-01-13 NOTE — Progress Notes (Signed)
OFFICE NOTE  Chief Complaint:  Routine office visit  Primary Care Physician: [name], MD  HPI:  Dustin Morais. is a 63 year old gentleman with a history of unexplained syncope, ultimately found to have high-degree AV block with a loop recorder. He underwent explantation and permanent pacemaker placement in January of 2013 and has done fairly well since then and has had no further syncopal events. He also has an abdominal aortic aneurysm which was recently reassessed by ultrasound and is fairly stable, measuring 4.1 x 4.3 cm. Unfortunately he has bilateral lower extremity arterial disease with SFA narrowing of 50-69%. His right ABI is 0.84, his left ABI is 0.76. At this point he is not having any symptoms of claudication, however, we recommended we repeat the studies in 1 year. Unfortunately he also has had continued tobacco dependence and smoking now 3 packs a day. We discussed that this probably amounts to about $100 a week, or $400 a month, which is approximately one-third of his monthly Social Security check. Despite that he continues to smoke. Finally his blood pressure was noted to be low today, measuring 80/56 and a third recheck indicated it was about 85/56.  At his last visit I decreased his blood pressure medications and he reported his blood pressure is now better. He also saw Dr. Sallyanne Kuster recently who felt like his pacemaker was working appropriately.  Dustin Estes returns today for followup. He had recent abdominal Dopplers which show a small increase in his abdominal aortic aneurysm to 4.5 by 4.6 cm. he's had slight worsening in his peripheral Dopplers however denies any claudication. He's had no further syncopal episodes since he had a pacemaker placed. Unfortunately continues to smoke. Blood pressure is well controlled.  PMHx:  Past Medical History  Diagnosis Date  . Dysrhythmia   . Diabetes mellitus   . Sleep apnea   . 2Nd degree AV block 04/29/2011    Medtronic  Adapta  . PAD (peripheral artery disease)   . Abdominal aortic aneurysm     4.1 x 4.3 cm  . Hypertension   . Obesity   . Tobacco abuse   . OSA (obstructive sleep apnea)     Past Surgical History  Procedure Laterality Date  . Permanent pacemaker insertion  04/29/2011    Medtronic Adapta  . Brain surgery    . Appendectomy  12/69  . Bladder surgery  9/04,11/05,9/07    tumor removal  . US echocardiography  04/13/2010    mild asymmetric LVH, EF >55%,mild mitral ca+,AOV mildly sclerotic  . Nm myocar perf wall motion  04/13/2010    moderate, mostly fixed  inferoseptal defect, suspicious for artifact.    FAMHx:  Family History  Problem Relation Age of Onset  . Diabetes Mother   . Diabetes Father   . Stroke Father     SOCHx:   reports that he has been smoking Cigarettes.  He has a 94 pack-year smoking history. He has never used smokeless tobacco. He reports that he drinks alcohol. He reports that he does not use illicit drugs.  ALLERGIES:  No Known Allergies  ROS: A comprehensive review of systems was negative except for: Constitutional: positive for weight gain Behavioral/Psych: positive for depression and tobacco use  HOME MEDS: Current Outpatient Prescriptions  Medication Sig Dispense Refill  . aspirin EC 325 MG tablet Take 325 mg by mouth at bedtime.        Marland Kitchen diltiazem (CARDIZEM CD) 360 MG 24 hr capsule Take 360 mg  by mouth every morning.        . fenofibrate 160 MG tablet Take 160 mg by mouth at bedtime.        . Flaxseed, Linseed, 1000 MG CAPS Take 1 capsule by mouth daily.       Marland Kitchen glipiZIDE (GLUCOTROL) 10 MG tablet Take 10 mg by mouth every morning.        . metFORMIN (GLUCOPHAGE) 500 MG tablet Take 2 tablets (1,000 mg total) by mouth 2 (two) times daily with a meal.      . metoprolol tartrate (LOPRESSOR) 12.5 mg TABS Take 25 mg by mouth daily.      . Multiple Vitamin (MULITIVITAMIN WITH MINERALS) TABS Take 1 tablet by mouth at bedtime. Centrum Silver      . niacin  500 MG tablet Take 500 mg by mouth 2 (two) times daily with a meal.        . quinapril (ACCUPRIL) 20 MG tablet TAKE ONE TABLET BY MOUTH DAILY  30 tablet  5  . simvastatin (ZOCOR) 40 MG tablet Take 40 mg by mouth every morning.         No current facility-administered medications for this visit.    LABS/IMAGING: No results found for this or any previous visit (from the past 48 hour(s)). No results found.  VITALS: BP 121/75  Pulse 83  Ht 5\' 8"  (1.727 m)  Wt 236 lb 3.2 oz (107.14 kg)  BMI 35.92 kg/m2  EXAM: Gen: Awake, NAD HEENT: PERRLA Lungs: clear CV: RRR Abd: Obese, soft, non-tender EXT: no edema Neuro: Non-focal Psych: Normal  EKG: Sinus rhythm at 83 with RBBB  ASSESSMENT: 1. Syncope status post dual-chamber pacemaker 2. Obstructive sleep apnea 3. Obesity 4. Diabetes type 2 5. Dyslipidemia 6. Hypertension 7. PAD - Bilateral ABI's at 0.8 8. Stable aortic aneurysm measuring 4.1 x 4.3 cm 9. Tobacco dependence  PLAN: 1.   Dustin Estes is to be doing fairly well without any further chest pain or worsening shortness of breath. He's had no further syncopal episodes since the placement of his pacemaker. He does have an infrarenal aneurysm which is slightly enlarged to 4.5 x 4.6 cm and will need monitoring. He also has bilateral PAD without lifestyle limiting claudication. I again encouraged him to stop smoking and spent more than 5 minutes talking to him about smoking cessation. He again reassures me that he would be working on it. We'll plan to see him back annually and he'll be followed by Dr. Loletha Grayer for his pacemaker.  Pixie Casino, MD, [facility] Attending Cardiologist [facility] 01/13/2014, 9:46 AM

## 2014-01-13 NOTE — Patient Instructions (Signed)
Your physician wants you to follow-up in: 1 year.  You will receive a reminder letter in the mail two months in advance. If you don't receive a letter, please call our office to schedule the follow-up appointment.  Please schedule a clinic device check with Tobin Chad (this month)

## 2014-01-14 ENCOUNTER — Telehealth: Payer: Self-pay | Admitting: *Deleted

## 2014-01-14 NOTE — Telephone Encounter (Signed)
Confirmed pt has appt w/ Dr. Sallyanne Kuster 03/18/14. Pt has also changed insurance since he retired. Pt prefers not to do remote pacer checks; prefers OV only.

## 2014-01-21 ENCOUNTER — Encounter: Payer: Self-pay | Admitting: *Deleted

## 2014-03-18 ENCOUNTER — Encounter: Payer: Self-pay | Admitting: Cardiovascular Disease

## 2014-03-18 ENCOUNTER — Ambulatory Visit (INDEPENDENT_AMBULATORY_CARE_PROVIDER_SITE_OTHER): Payer: Managed Care, Other (non HMO) | Admitting: Cardiovascular Disease

## 2014-03-18 VITALS — BP 114/64 | HR 85 | Resp 16 | Ht 68.0 in | Wt 230.3 lb

## 2014-03-18 DIAGNOSIS — I714 Abdominal aortic aneurysm, without rupture, unspecified: Secondary | ICD-10-CM

## 2014-03-18 DIAGNOSIS — G4733 Obstructive sleep apnea (adult) (pediatric): Secondary | ICD-10-CM

## 2014-03-18 DIAGNOSIS — I739 Peripheral vascular disease, unspecified: Secondary | ICD-10-CM

## 2014-03-18 DIAGNOSIS — I480 Paroxysmal atrial fibrillation: Secondary | ICD-10-CM

## 2014-03-18 DIAGNOSIS — R0789 Other chest pain: Secondary | ICD-10-CM

## 2014-03-18 DIAGNOSIS — I441 Atrioventricular block, second degree: Secondary | ICD-10-CM

## 2014-03-18 DIAGNOSIS — Z950 Presence of cardiac pacemaker: Secondary | ICD-10-CM

## 2014-03-18 DIAGNOSIS — R001 Bradycardia, unspecified: Secondary | ICD-10-CM

## 2014-03-18 DIAGNOSIS — I459 Conduction disorder, unspecified: Secondary | ICD-10-CM

## 2014-03-18 DIAGNOSIS — I471 Supraventricular tachycardia: Secondary | ICD-10-CM

## 2014-03-18 DIAGNOSIS — I10 Essential (primary) hypertension: Secondary | ICD-10-CM

## 2014-03-18 NOTE — Patient Instructions (Addendum)
Your physician recommends that you schedule a follow-up appointment in: July with Dr.Croitoru + pacemaker check  Remote monitoring is used to monitor your Pacemaker of ICD from home. This monitoring reduces the number of office visits required to check your device to one time per year. It allows [place] to keep an eye on the functioning of your device to ensure it is working properly. You are scheduled for a device check from home on 3 Months. You may send your transmission at any time that day. If you have a wireless device, the transmission will be sent automatically. After your physician reviews your transmission, you will receive a postcard with your next transmission date.

## 2014-03-19 ENCOUNTER — Encounter: Payer: Self-pay | Admitting: Cardiovascular Disease

## 2014-03-19 DIAGNOSIS — I441 Atrioventricular block, second degree: Secondary | ICD-10-CM

## 2014-03-19 DIAGNOSIS — I480 Paroxysmal atrial fibrillation: Secondary | ICD-10-CM | POA: Insufficient documentation

## 2014-03-19 HISTORY — DX: Atrioventricular block, second degree: I44.1

## 2014-03-19 HISTORY — DX: Paroxysmal atrial fibrillation: I48.0

## 2014-03-19 LAB — MDC_IDC_ENUM_SESS_TYPE_INCLINIC
Battery Remaining Longevity: 163 mo
Brady Statistic AP VP Percent: 1 %
Brady Statistic AP VS Percent: 6 %
Brady Statistic AS VP Percent: 1 %
Brady Statistic AS VS Percent: 91 %
Date Time Interrogation Session: 20151201153032
Lead Channel Impedance Value: 770 Ohm
Lead Channel Pacing Threshold Amplitude: 0.5 V
Lead Channel Pacing Threshold Amplitude: 0.75 V
Lead Channel Pacing Threshold Pulse Width: 0.4 ms
Lead Channel Sensing Intrinsic Amplitude: 2 mV
Lead Channel Sensing Intrinsic Amplitude: 8 mV
MDC IDC MSMT BATTERY IMPEDANCE: 138 Ohm
MDC IDC MSMT BATTERY VOLTAGE: 2.78 V
MDC IDC MSMT LEADCHNL RA IMPEDANCE VALUE: 481 Ohm
MDC IDC MSMT LEADCHNL RV PACING THRESHOLD PULSEWIDTH: 0.4 ms
MDC IDC SET LEADCHNL RA PACING AMPLITUDE: 1.5 V
MDC IDC SET LEADCHNL RV PACING AMPLITUDE: 2 V
MDC IDC SET LEADCHNL RV PACING PULSEWIDTH: 0.4 ms
MDC IDC SET LEADCHNL RV SENSING SENSITIVITY: 4 mV

## 2014-03-19 NOTE — Progress Notes (Signed)
Patient ID: Dustin Morgans., male   DOB: Oct 18, 1950, 63 y.o.   MRN: [id number]      Reason for office visit Pacemaker follow up  Normal dual chamber pacemaker function (Medtronic Adapta 2013, implanted for intermittent high grade AV block with recurrent syncope) with only 6% atrial pacing and 2.4% ventricular pacing >13 years generator longevity, excellent lead parameters. Occasional episodes of brief paroxysmal atrial fibrillation, longest 10 minutes, overall burden <0.1%, good ventricular rate control. Has financial difficulties related to high deductible medical insurance and wants to delay repeat device checks until medicare age next June. Also has OSA, type 2 DM, HTN, PAD and AAA, bilateral lower extremity arterial disease, ongoing tobacco use.   No Known Allergies  Current Outpatient Prescriptions  Medication Sig Dispense Refill  . aspirin EC 325 MG tablet Take 325 mg by mouth at bedtime.      Marland Kitchen diltiazem (CARDIZEM CD) 360 MG 24 hr capsule Take 360 mg by mouth every morning.      . fenofibrate 160 MG tablet Take 160 mg by mouth at bedtime.      . Flaxseed, Linseed, 1000 MG CAPS Take 1 capsule by mouth daily.     Marland Kitchen glipiZIDE (GLUCOTROL) 10 MG tablet Take 10 mg by mouth every morning.      . metoprolol tartrate (LOPRESSOR) 12.5 mg TABS Take 25 mg by mouth daily.    . Multiple Vitamin (MULITIVITAMIN WITH MINERALS) TABS Take 1 tablet by mouth at bedtime. Centrum Silver    . niacin 500 MG tablet Take 500 mg by mouth 2 (two) times daily with a meal.      . quinapril (ACCUPRIL) 20 MG tablet TAKE ONE TABLET BY MOUTH DAILY 30 tablet 5  . simvastatin (ZOCOR) 40 MG tablet Take 40 mg by mouth every morning.      . metFORMIN (GLUCOPHAGE) 1000 MG tablet Take 1 tablet by mouth daily.     No current facility-administered medications for this visit.    Past Medical History  Diagnosis Date  . Dysrhythmia   . Diabetes mellitus   . Sleep apnea   . 2Nd degree AV block 04/29/2011    Medtronic  Adapta  . PAD (peripheral artery disease)   . Abdominal aortic aneurysm     4.1 x 4.3 cm  . Hypertension   . Obesity   . Tobacco abuse   . OSA (obstructive sleep apnea)     Past Surgical History  Procedure Laterality Date  . Permanent pacemaker insertion  04/29/2011    Medtronic Adapta  . Brain surgery    . Appendectomy  12/69  . Bladder surgery  9/04,11/05,9/07    tumor removal  . US echocardiography  04/13/2010    mild asymmetric LVH, EF >55%,mild mitral ca+,AOV mildly sclerotic  . Nm myocar perf wall motion  04/13/2010    moderate, mostly fixed  inferoseptal defect, suspicious for artifact.    Family History  Problem Relation Age of Onset  . Diabetes Mother   . Diabetes Father   . Stroke Father     History   Social History  . Marital Status: Divorced    Spouse Name: N/A    Number of Children: N/A  . Years of Education: N/A   Occupational History  . Not on file.   Social History Main Topics  . Smoking status: Current Every Day Smoker -- 2.00 packs/day for 47 years    Types: Cigarettes  . Smokeless tobacco: Never Used  Comment: 4 (10packs per caroton) cartons/month (01/13/14)  . Alcohol Use: Yes     Comment: "hardly ever"  . Drug Use: No  . Sexual Activity: No   Other Topics Concern  . Not on file   Social History Narrative    Review of systems: The patient specifically denies any chest pain at rest or with exertion, dyspnea at rest or with exertion, orthopnea, paroxysmal nocturnal dyspnea, syncope, palpitations, focal neurological deficits, intermittent claudication, lower extremity edema, unexplained weight gain, cough, hemoptysis or wheezing.  The patient also denies abdominal pain, nausea, vomiting, dysphagia, diarrhea, constipation, polyuria, polydipsia, dysuria, hematuria, frequency, urgency, abnormal bleeding or bruising, fever, chills, unexpected weight changes, mood swings, change in skin or hair texture, change in voice quality, auditory or  visual problems, allergic reactions or rashes, new musculoskeletal complaints other than usual "aches and pains".   PHYSICAL EXAM BP 114/64 mmHg  Pulse 85  Resp 16  Ht 5\' 8"  (1.727 m)  Wt 230 lb 4.8 oz (104.463 kg)  BMI 35.02 kg/m2  General: Alert, oriented x3, no distress Head: left craniotomy scar, PERRL, EOMI, no exophtalmos or lid lag, no myxedema, no xanthelasma; normal ears, nose and oropharynx Neck: normal jugular venous pulsations and no hepatojugular reflux; brisk carotid pulses without delay and no carotid bruits Chest: clear to auscultation, no signs of consolidation by percussion or palpation, normal fremitus, symmetrical and full respiratory excursions Cardiovascular: normal position and quality of the apical impulse, regular rhythm, normal first and second heart sounds, no murmurs, rubs or gallops Abdomen: no tenderness or distention, no masses by palpation, no abnormal pulsatility or arterial bruits, normal bowel sounds, no hepatosplenomegaly Extremities: no clubbing, cyanosis or edema; 2+ radial, ulnar and brachial pulses bilaterally; 2+ right femoral, posterior tibial and dorsalis pedis pulses; 2+ left femoral, posterior tibial and dorsalis pedis pulses; no subclavian or femoral bruits Neurological: grossly nonfocal   EKG: A sensed (sinus rhythm), V paced  Lipid Panel  No results found for: CHOL, TRIG, HDL, CHOLHDL, VLDL, LDLCALC, LDLDIRECT  BMET    Component Value Date/Time   NA 135 01/29/2010 0725   K 4.0 01/29/2010 0725   CL 101 01/29/2010 0725   CO2 29 01/29/2010 0725   GLUCOSE 97 01/29/2010 0725   BUN 12 01/29/2010 0725   CREATININE 1.02 04/29/2011 2038   CALCIUM 9.0 01/29/2010 0725   GFRNONAA 77* 04/29/2011 2038   GFRAA 90* 04/29/2011 2038     ASSESSMENT AND PLAN  Normally functioning dual chamber pacemaker. Not pacemaker dependent, but with history of recurrent and dangerous syncope due to intermittent high grade AV block.  He has a very low  burden of brief (max duration 10 minutes) and asymptomatic PAF detected by his pacemaker. No history of stroke/TIA. No bleeding history. CHADSVasc score at least 4. If longer episodes are detected, he may need full anticoagulation therapy. He cannot afford follow up but I think it is important to check his pacemaker more frequently to make sure we identify sustained arrhythmia sooner.  Smoking cessation recommendations reinforced.  Orders Placed This Encounter  Procedures  . Implantable device check  . EKG 12-Lead   Meds ordered this encounter  Medications  . metFORMIN (GLUCOPHAGE) 1000 MG tablet    Sig: Take 1 tablet by mouth daily.    Holli Humbles, MD, [place] [phone] office [phone] pager

## 2014-03-27 ENCOUNTER — Encounter (HOSPITAL_COMMUNITY): Payer: Self-pay | Admitting: Cardiovascular Disease

## 2014-05-19 ENCOUNTER — Other Ambulatory Visit (HOSPITAL_COMMUNITY): Payer: Self-pay | Admitting: Internal Medicine

## 2014-05-19 DIAGNOSIS — I714 Abdominal aortic aneurysm, without rupture, unspecified: Secondary | ICD-10-CM

## 2014-05-19 DIAGNOSIS — I739 Peripheral vascular disease, unspecified: Secondary | ICD-10-CM

## 2014-05-23 ENCOUNTER — Encounter (HOSPITAL_COMMUNITY): Payer: Managed Care, Other (non HMO)

## 2014-05-30 ENCOUNTER — Ambulatory Visit (HOSPITAL_COMMUNITY)
Admission: RE | Admit: 2014-05-30 | Discharge: 2014-05-30 | Disposition: A | Discharge status: Home / Self Care | Payer: Managed Care, Other (non HMO) | Source: Ambulatory Visit | Attending: Cardiovascular Disease | Admitting: Cardiovascular Disease

## 2014-05-30 ENCOUNTER — Ambulatory Visit (HOSPITAL_BASED_OUTPATIENT_CLINIC_OR_DEPARTMENT_OTHER)
Admission: RE | Admit: 2014-05-30 | Discharge: 2014-05-30 | Disposition: A | Discharge status: Home / Self Care | Payer: Managed Care, Other (non HMO) | Source: Ambulatory Visit | Attending: Cardiovascular Disease | Admitting: Cardiovascular Disease

## 2014-05-30 DIAGNOSIS — I739 Peripheral vascular disease, unspecified: Secondary | ICD-10-CM

## 2014-05-30 DIAGNOSIS — I70213 Atherosclerosis of native arteries of extremities with intermittent claudication, bilateral legs: Secondary | ICD-10-CM | POA: Insufficient documentation

## 2014-05-30 DIAGNOSIS — I700 Atherosclerosis of aorta: Secondary | ICD-10-CM | POA: Diagnosis not present

## 2014-05-30 DIAGNOSIS — I714 Abdominal aortic aneurysm, without rupture, unspecified: Secondary | ICD-10-CM

## 2014-05-30 NOTE — Progress Notes (Addendum)
Aorta Duplex Completed. Stable AAA measuring 4.6 x 4.5 cm. Oda Cogan, BS, RDMS, RVT

## 2014-05-30 NOTE — Progress Notes (Signed)
Arterial Duplex Lower Ext. Completed. Ahren Pettinger, BS, RDMS, RVT  

## 2014-06-04 ENCOUNTER — Telehealth: Payer: Self-pay | Admitting: Internal Medicine

## 2014-06-04 NOTE — Telephone Encounter (Signed)
Returning your call. °

## 2014-06-05 ENCOUNTER — Other Ambulatory Visit: Payer: Self-pay | Admitting: *Deleted

## 2014-06-05 DIAGNOSIS — I714 Abdominal aortic aneurysm, without rupture, unspecified: Secondary | ICD-10-CM

## 2014-06-05 DIAGNOSIS — I739 Peripheral vascular disease, unspecified: Secondary | ICD-10-CM

## 2014-06-05 NOTE — Telephone Encounter (Signed)
Returned call to patient and notified him of results. He voiced understanding

## 2014-06-19 ENCOUNTER — Encounter: Payer: Managed Care, Other (non HMO) | Admitting: *Deleted

## 2014-06-19 ENCOUNTER — Telehealth: Payer: Self-pay | Admitting: Cardiology

## 2014-06-19 NOTE — Telephone Encounter (Signed)
Spoke with pt and reminded pt of remote transmission that is due today. Pt verbalized understanding.   

## 2014-06-20 ENCOUNTER — Encounter: Payer: Self-pay | Admitting: Cardiology

## 2014-07-01 ENCOUNTER — Ambulatory Visit (INDEPENDENT_AMBULATORY_CARE_PROVIDER_SITE_OTHER): Payer: Managed Care, Other (non HMO) | Admitting: *Deleted

## 2014-07-01 DIAGNOSIS — Z950 Presence of cardiac pacemaker: Secondary | ICD-10-CM | POA: Diagnosis not present

## 2014-07-01 DIAGNOSIS — I441 Atrioventricular block, second degree: Secondary | ICD-10-CM | POA: Diagnosis not present

## 2014-07-02 ENCOUNTER — Telehealth: Payer: Self-pay | Admitting: Cardiovascular Disease

## 2014-07-02 NOTE — Telephone Encounter (Signed)
Informed patient that transmission was received. Patient voiced understanding.

## 2014-07-02 NOTE — Telephone Encounter (Signed)
Pt transmitted last night,he wants too know if it came through. Please let him know asap,leave a message if he is not there.

## 2014-07-03 NOTE — Progress Notes (Signed)
Remote pacemaker transmission.   

## 2014-07-04 LAB — MDC_IDC_ENUM_SESS_TYPE_REMOTE
Battery Impedance: 162 Ohm
Battery Remaining Longevity: 156 mo
Battery Voltage: 2.78 V
Brady Statistic AP VS Percent: 8 %
Date Time Interrogation Session: 20160316003425
Lead Channel Impedance Value: 455 Ohm
Lead Channel Impedance Value: 860 Ohm
Lead Channel Pacing Threshold Pulse Width: 0.4 ms
Lead Channel Pacing Threshold Pulse Width: 0.4 ms
Lead Channel Sensing Intrinsic Amplitude: 8 mV
Lead Channel Setting Pacing Amplitude: 1.5 V
Lead Channel Setting Sensing Sensitivity: 4 mV
MDC IDC MSMT LEADCHNL RA PACING THRESHOLD AMPLITUDE: 0.75 V
MDC IDC MSMT LEADCHNL RA SENSING INTR AMPL: 2.8 mV
MDC IDC MSMT LEADCHNL RV PACING THRESHOLD AMPLITUDE: 0.5 V
MDC IDC SET LEADCHNL RV PACING AMPLITUDE: 2 V
MDC IDC SET LEADCHNL RV PACING PULSEWIDTH: 0.4 ms
MDC IDC STAT BRADY AP VP PERCENT: 2 %
MDC IDC STAT BRADY AS VP PERCENT: 2 %
MDC IDC STAT BRADY AS VS PERCENT: 88 %

## 2014-07-09 ENCOUNTER — Encounter: Payer: Self-pay | Admitting: *Deleted

## 2014-07-18 ENCOUNTER — Encounter: Payer: Self-pay | Admitting: Cardiovascular Disease

## 2014-07-21 ENCOUNTER — Encounter: Payer: Self-pay | Admitting: Cardiovascular Disease

## 2014-10-21 ENCOUNTER — Encounter: Payer: Managed Care, Other (non HMO) | Admitting: Cardiovascular Disease

## 2014-12-02 ENCOUNTER — Ambulatory Visit (INDEPENDENT_AMBULATORY_CARE_PROVIDER_SITE_OTHER): Payer: Medicare Other | Admitting: Cardiovascular Disease

## 2014-12-02 ENCOUNTER — Encounter: Payer: Self-pay | Admitting: Cardiovascular Disease

## 2014-12-02 VITALS — BP 131/70 | HR 90 | Resp 20 | Ht 68.0 in | Wt 217.0 lb

## 2014-12-02 DIAGNOSIS — I10 Essential (primary) hypertension: Secondary | ICD-10-CM | POA: Diagnosis not present

## 2014-12-02 DIAGNOSIS — Z720 Tobacco use: Secondary | ICD-10-CM

## 2014-12-02 DIAGNOSIS — I459 Conduction disorder, unspecified: Secondary | ICD-10-CM

## 2014-12-02 DIAGNOSIS — I480 Paroxysmal atrial fibrillation: Secondary | ICD-10-CM | POA: Diagnosis not present

## 2014-12-02 DIAGNOSIS — I471 Supraventricular tachycardia: Secondary | ICD-10-CM

## 2014-12-02 DIAGNOSIS — Z79899 Other long term (current) drug therapy: Secondary | ICD-10-CM

## 2014-12-02 DIAGNOSIS — Z950 Presence of cardiac pacemaker: Secondary | ICD-10-CM

## 2014-12-02 DIAGNOSIS — E785 Hyperlipidemia, unspecified: Secondary | ICD-10-CM

## 2014-12-02 DIAGNOSIS — I714 Abdominal aortic aneurysm, without rupture, unspecified: Secondary | ICD-10-CM

## 2014-12-02 DIAGNOSIS — I441 Atrioventricular block, second degree: Secondary | ICD-10-CM

## 2014-12-02 NOTE — Patient Instructions (Signed)
Medication Instructions:   TAKE METOPROLOL 25MG  - 1/2 TABLET TWICE A DAY  Labwork:  IF YOU DID NOT HAVE YOUR CHOLESTEROL CHECKED THROUGH DR. Willette Cluster' OFFICE PLEASE HAVE THIS DONE ONE MORNING [place].    Follow-Up:  Dr. Sallyanne Kuster recommends that you schedule a follow-up appointment in: [place] ([employer]TRONIC-G[facility])    Any Other Special Instructions Will Be Listed Below (If Applicable).  Remote monitoring is used to monitor your Pacemaker from home. This monitoring reduces the number of office visits required to check your device to one time per year. It allows [place] to monitor the functioning of your device to ensure it is working properly. You are scheduled for a device check from home on March 06, 2015. You may send your transmission at any time that day. If you have a wireless device, the transmission will be sent automatically. After your physician reviews your transmission, you will receive a postcard with your next transmission date.

## 2014-12-04 ENCOUNTER — Encounter: Payer: Self-pay | Admitting: Cardiovascular Disease

## 2014-12-04 NOTE — Progress Notes (Signed)
Patient ID: Dustin Morgans., male   DOB: 05/09/1950, 64 y.o.   MRN: [id number]     Cardiology Office Note   Date:  12/04/2014   ID:  Dustin Morgans., DOB 09-Nov-1950, MRN [id number]  PCP:  [name], Dustin Estes  Cardiologist:   Sanda Klein, Dustin Estes   Chief Complaint  Patient presents with  . Phys Pacer Check    Patient has no complaints.      History of Present Illness: Dustin Mcglasson. is a 64 y.o. male who presents for pacemaker follow-up. His compliance has been less than perfect until recently when he received Medicare insurance. He has multiple medical problems including obstructive sleep apnea, type 2 diabetes mellitus, peripheral arterial disease and abdominal aortic aneurysm, systemic hypertension, and continues to smoke heavily.  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 It has detected occasional very brief episodes of atrial fibrillation and more frequent episodes of atrial tachycardia with one-to-one AV conduction. These are generally been completely asymptomatic.  Interrogation of his device today shows normal function with an estimated generator longevity of 13 years, 9% atrial pacing and 3.3% ventricular pacing. He has only had 7 brief episodes of mode switch for a maximum of 10 seconds. Virtually all of these are present atrial tachycardia with one-to-one conduction. One episode that occurred on 07/24/2014 looks like a 3 second, 12 beat run of nonsustained ventricular tachycardia. Another episode occurred July 29 and may be VT, but no electrogram is available for review.  He reports having lab tests performed with Dr. Tollie Pizza in May but those results are not yet available for review.  He is on combination statin and fenofibrate therapy as well as niacin. This needs to be reviewed    Past Medical History  Diagnosis Date  . Dysrhythmia   . Diabetes mellitus   .  Sleep apnea   . 2Nd degree AV block 04/29/2011    Medtronic Adapta  . PAD (peripheral artery disease)   . Abdominal aortic aneurysm     4.1 x 4.3 cm  . Hypertension   . Obesity   . Tobacco abuse   . OSA (obstructive sleep apnea)     Past Surgical History  Procedure Laterality Date  . Permanent pacemaker insertion  04/29/2011    Medtronic Adapta  . Brain surgery    . Appendectomy  12/69  . Bladder surgery  9/04,11/05,9/07    tumor removal  . US echocardiography  04/13/2010    mild asymmetric LVH, EF >55%,mild mitral ca+,AOV mildly sclerotic  . Nm myocar perf wall motion  04/13/2010    moderate, mostly fixed  inferoseptal defect, suspicious for artifact.  . Permanent pacemaker insertion N/A 04/29/2011    Procedure: PERMANENT PACEMAKER INSERTION;  Surgeon: Sanda Klein, Dustin Estes;  Location: [place] [facility];  Service: Cardiovascular;  Laterality: N/A;  . Loop recorder explant  04/29/2011    Procedure: LOOP RECORDER EXPLANT;  Surgeon: Sanda Klein, Dustin Estes;  Location: [place] [facility];  Service: Cardiovascular;;     Current Outpatient Prescriptions  Medication Sig Dispense Refill  . aspirin EC 325 MG tablet Take 325 mg by mouth at bedtime.      Marland Kitchen diltiazem (CARDIZEM CD) 360 MG 24 hr capsule Take 360 mg by mouth every morning.      . fenofibrate 160 MG tablet Take 160 mg by mouth at bedtime.      . Flaxseed, Linseed, 1000 MG  CAPS Take 1 capsule by mouth 2 (two) times daily.     Marland Kitchen glipiZIDE (GLUCOTROL) 10 MG tablet Take 10 mg by mouth every morning.      . metFORMIN (GLUCOPHAGE) 1000 MG tablet Take 1 tablet by mouth daily. Take 1 tablet in the AM and 1 in the PM.    . metoprolol tartrate (LOPRESSOR) 12.5 mg TABS Take 12.5 mg by mouth 2 (two) times daily.    . Multiple Vitamin (MULITIVITAMIN WITH MINERALS) TABS Take 1 tablet by mouth at bedtime. Centrum Silver    . niacin 500 MG tablet Take 500 mg by mouth 2 (two) times daily with a meal.      . quinapril (ACCUPRIL) 20 MG tablet TAKE ONE TABLET BY  MOUTH DAILY 30 tablet 5  . simvastatin (ZOCOR) 40 MG tablet Take 40 mg by mouth every morning.       No current facility-administered medications for this visit.    Allergies:   Review of patient's allergies indicates no known allergies.    Social History:  The patient  reports that he has been smoking Cigarettes.  He has a 94 pack-year smoking history. He has never used smokeless tobacco. He reports that he drinks alcohol. He reports that he does not use illicit drugs.   Family History:  The patient's family history includes Diabetes in his father and mother; Stroke in his father.    ROS:  Please see the history of present illness.    Otherwise, review of systems positive for none.   All other systems are reviewed and negative.    PHYSICAL EXAM: VS:  BP 131/70 mmHg  Pulse 90  Resp 20  Ht 5\' 8"  (1.727 m)  Wt 217 lb (98.431 kg)  BMI 33.00 kg/m2 , BMI Body mass index is 33 kg/(m^2).  General: Alert, oriented x3, no distress Head: no evidence of trauma, PERRL, EOMI, no exophtalmos or lid lag, no myxedema, no xanthelasma; normal ears, nose and oropharynx Neck: normal jugular venous pulsations and no hepatojugular reflux; brisk carotid pulses without delay and no carotid bruits Chest: clear to auscultation, no signs of consolidation by percussion or palpation, normal fremitus, symmetrical and full respiratory excursions, healthy left subclavian pacemaker site Cardiovascular: normal position and quality of the apical impulse, regular rhythm, normal first and widely split second heart sounds, no murmurs, rubs or gallops Abdomen: no tenderness or distention, no masses by palpation, no abnormal pulsatility or arterial bruits, normal bowel sounds, no hepatosplenomegaly Extremities: no clubbing, cyanosis or edema; 2+ radial, ulnar and brachial pulses bilaterally; 2+ right femoral, posterior tibial and dorsalis pedis pulses; 2+ left femoral, posterior tibial and dorsalis pedis pulses; no  subclavian or femoral bruits Neurological: grossly nonfocal Psych: euthymic mood, full affect   EKG:  EKG is ordered today. The ekg ordered today demonstrates sinus rhythm, right bundle branch block, left anterior fascicular block   Recent Labs: No results found for requested labs within last 365 days.    Lipid Panel No results found for: CHOL, TRIG, HDL, CHOLHDL, VLDL, LDLCALC, LDLDIRECT    Wt Readings from Last 3 Encounters:  12/02/14 217 lb (98.431 kg)  03/18/14 230 lb 4.8 oz (104.463 kg)  01/13/14 236 lb 3.2 oz (107.14 kg)    ASSESSMENT AND PLAN:  Normally functioning dual chamber pacemaker. Not pacemaker dependent, but with history of recurrent and dangerous syncope due to intermittent high grade AV block.  He has had a very low burden of brief (max duration 10 minutes) and asymptomatic  paroxysmal atrial fibrillation, detected by his pacemaker. None have been seen in the last 6 months. No history of stroke/TIA. No bleeding history. CHADSVasc score at least 4.  If longer episodes are detected, he may need full anticoagulation therapy. I think he must also first prove compliance with follow-up before we commit to potentially risky medications that require careful monitoring   Smoking cessation recommendations reinforced.     Current medicines are reviewed at length with the patient today.  The patient does not have concerns regarding medicines.  The following changes have been made:  no change  Labs/ tests ordered today include:  Orders Placed This Encounter  Procedures  . Comprehensive metabolic panel  . Lipid panel  . EKG 12-Lead    Patient Instructions  Medication Instructions:   TAKE METOPROLOL 25MG  - 1/2 TABLET TWICE A DAY  Labwork:  IF YOU DID NOT HAVE YOUR CHOLESTEROL CHECKED THROUGH DR. Willette Cluster' OFFICE PLEASE HAVE THIS DONE ONE MORNING [place].    Follow-Up:  Dr. Sallyanne Kuster recommends that you schedule a follow-up appointment in: [place] ([facility])    Any Other Special Instructions Will Be Listed Below (If Applicable).  Remote monitoring is used to monitor your Pacemaker from home. This monitoring reduces the number of office visits required to check your device to one time per year. It allows [place] to monitor the functioning of your device to ensure it is working properly. You are scheduled for a device check from home on March 06, 2015. You may send your transmission at any time that day. If you have a wireless device, the transmission will be sent automatically. After your physician reviews your transmission, you will receive a postcard with your next transmission date.        Dustin Spray, Dustin Estes  12/04/2014 2:42 PM    Sanda Klein, Dustin Estes, [facility] [phone] office [phone] pager

## 2014-12-05 LAB — CUP PACEART INCLINIC DEVICE CHECK
Battery Remaining Longevity: 158 mo
Brady Statistic AS VP Percent: 2 %
Date Time Interrogation Session: 20160816112725
Lead Channel Pacing Threshold Amplitude: 0.375 V
Lead Channel Pacing Threshold Pulse Width: 0.4 ms
Lead Channel Pacing Threshold Pulse Width: 0.4 ms
Lead Channel Setting Pacing Amplitude: 2 V
MDC IDC MSMT BATTERY IMPEDANCE: 162 Ohm
MDC IDC MSMT BATTERY VOLTAGE: 2.78 V
MDC IDC MSMT LEADCHNL RA IMPEDANCE VALUE: 518 Ohm
MDC IDC MSMT LEADCHNL RA PACING THRESHOLD AMPLITUDE: 0.625 V
MDC IDC MSMT LEADCHNL RA SENSING INTR AMPL: 1.4 mV
MDC IDC MSMT LEADCHNL RV IMPEDANCE VALUE: 928 Ohm
MDC IDC MSMT LEADCHNL RV SENSING INTR AMPL: 8 mV
MDC IDC SET LEADCHNL RA PACING AMPLITUDE: 1.5 V
MDC IDC SET LEADCHNL RV PACING PULSEWIDTH: 0.4 ms
MDC IDC SET LEADCHNL RV SENSING SENSITIVITY: 2.8 mV
MDC IDC STAT BRADY AP VP PERCENT: 1 %
MDC IDC STAT BRADY AP VS PERCENT: 7 %
MDC IDC STAT BRADY AS VS PERCENT: 89 %

## 2014-12-29 ENCOUNTER — Encounter: Payer: Self-pay | Admitting: Cardiovascular Disease

## 2015-01-21 ENCOUNTER — Other Ambulatory Visit: Payer: Self-pay | Admitting: Internal Medicine

## 2015-01-22 ENCOUNTER — Other Ambulatory Visit: Payer: Self-pay | Admitting: Internal Medicine

## 2015-01-22 DIAGNOSIS — I714 Abdominal aortic aneurysm, without rupture, unspecified: Secondary | ICD-10-CM

## 2015-01-27 ENCOUNTER — Ambulatory Visit (HOSPITAL_COMMUNITY)
Admission: RE | Admit: 2015-01-27 | Discharge: 2015-01-27 | Disposition: A | Discharge status: Home / Self Care | Payer: Medicare Other | Source: Ambulatory Visit | Attending: Urology | Admitting: Urology

## 2015-01-27 DIAGNOSIS — I708 Atherosclerosis of other arteries: Secondary | ICD-10-CM | POA: Diagnosis not present

## 2015-01-27 DIAGNOSIS — I714 Abdominal aortic aneurysm, without rupture, unspecified: Secondary | ICD-10-CM

## 2015-01-27 DIAGNOSIS — I700 Atherosclerosis of aorta: Secondary | ICD-10-CM | POA: Diagnosis not present

## 2015-02-05 ENCOUNTER — Encounter: Payer: Self-pay | Admitting: *Deleted

## 2015-02-05 ENCOUNTER — Other Ambulatory Visit: Payer: Self-pay | Admitting: *Deleted

## 2015-02-05 DIAGNOSIS — I714 Abdominal aortic aneurysm, without rupture, unspecified: Secondary | ICD-10-CM

## 2015-02-09 ENCOUNTER — Telehealth: Payer: Self-pay | Admitting: Internal Medicine

## 2015-02-09 NOTE — Telephone Encounter (Signed)
Dustin Estes is calling about his test results from 01/27/15 .Please call .Marland Kitchen   Thanks

## 2015-02-09 NOTE — Telephone Encounter (Signed)
Attempted to call patient no VM set up

## 2015-02-10 NOTE — Telephone Encounter (Signed)
Pt made aware of results no questions at this time.   

## 2015-02-10 NOTE — Telephone Encounter (Signed)
Pt is returning the nurse's call. He believes its about his result from a AAA Duplex that was done on 10/11

## 2015-03-05 ENCOUNTER — Encounter: Payer: Medicare Other | Admitting: *Deleted

## 2015-03-05 ENCOUNTER — Telehealth: Payer: Self-pay | Admitting: Cardiology

## 2015-03-05 NOTE — Telephone Encounter (Signed)
Attempted to confirm remote transmission with pt. No answer and was unable to leave a message.   

## 2015-03-06 ENCOUNTER — Encounter: Payer: Self-pay | Admitting: Cardiology

## 2015-05-21 ENCOUNTER — Inpatient Hospital Stay (HOSPITAL_COMMUNITY)
Admission: EM | Admit: 2015-05-21 | Discharge: 2015-06-01 | DRG: 377 | Disposition: A | Discharge status: Home / Self Care | Payer: Medicare Other | Attending: Internal Medicine | Admitting: Internal Medicine

## 2015-05-21 ENCOUNTER — Emergency Department (HOSPITAL_COMMUNITY): Payer: Medicare Other

## 2015-05-21 ENCOUNTER — Encounter (HOSPITAL_COMMUNITY): Payer: Self-pay | Admitting: *Deleted

## 2015-05-21 DIAGNOSIS — Z23 Encounter for immunization: Secondary | ICD-10-CM

## 2015-05-21 DIAGNOSIS — Z6826 Body mass index (BMI) 26.0-26.9, adult: Secondary | ICD-10-CM | POA: Diagnosis not present

## 2015-05-21 DIAGNOSIS — N179 Acute kidney failure, unspecified: Secondary | ICD-10-CM | POA: Diagnosis not present

## 2015-05-21 DIAGNOSIS — D509 Iron deficiency anemia, unspecified: Secondary | ICD-10-CM | POA: Diagnosis not present

## 2015-05-21 DIAGNOSIS — Z950 Presence of cardiac pacemaker: Secondary | ICD-10-CM | POA: Diagnosis not present

## 2015-05-21 DIAGNOSIS — I441 Atrioventricular block, second degree: Secondary | ICD-10-CM | POA: Diagnosis present

## 2015-05-21 DIAGNOSIS — G9389 Other specified disorders of brain: Secondary | ICD-10-CM | POA: Diagnosis not present

## 2015-05-21 DIAGNOSIS — K264 Chronic or unspecified duodenal ulcer with hemorrhage: Principal | ICD-10-CM | POA: Diagnosis present

## 2015-05-21 DIAGNOSIS — Z7982 Long term (current) use of aspirin: Secondary | ICD-10-CM

## 2015-05-21 DIAGNOSIS — Z79899 Other long term (current) drug therapy: Secondary | ICD-10-CM

## 2015-05-21 DIAGNOSIS — E119 Type 2 diabetes mellitus without complications: Secondary | ICD-10-CM

## 2015-05-21 DIAGNOSIS — E782 Mixed hyperlipidemia: Secondary | ICD-10-CM | POA: Diagnosis present

## 2015-05-21 DIAGNOSIS — I959 Hypotension, unspecified: Secondary | ICD-10-CM | POA: Diagnosis not present

## 2015-05-21 DIAGNOSIS — I10 Essential (primary) hypertension: Secondary | ICD-10-CM | POA: Diagnosis present

## 2015-05-21 DIAGNOSIS — Z9119 Patient's noncompliance with other medical treatment and regimen: Secondary | ICD-10-CM | POA: Diagnosis not present

## 2015-05-21 DIAGNOSIS — E1121 Type 2 diabetes mellitus with diabetic nephropathy: Secondary | ICD-10-CM | POA: Diagnosis not present

## 2015-05-21 DIAGNOSIS — E538 Deficiency of other specified B group vitamins: Secondary | ICD-10-CM | POA: Diagnosis not present

## 2015-05-21 DIAGNOSIS — G4733 Obstructive sleep apnea (adult) (pediatric): Secondary | ICD-10-CM | POA: Diagnosis present

## 2015-05-21 DIAGNOSIS — K2961 Other gastritis with bleeding: Secondary | ICD-10-CM | POA: Diagnosis not present

## 2015-05-21 DIAGNOSIS — R55 Syncope and collapse: Secondary | ICD-10-CM | POA: Diagnosis present

## 2015-05-21 DIAGNOSIS — E785 Hyperlipidemia, unspecified: Secondary | ICD-10-CM | POA: Diagnosis not present

## 2015-05-21 DIAGNOSIS — D649 Anemia, unspecified: Secondary | ICD-10-CM | POA: Diagnosis present

## 2015-05-21 DIAGNOSIS — K922 Gastrointestinal hemorrhage, unspecified: Secondary | ICD-10-CM | POA: Diagnosis not present

## 2015-05-21 DIAGNOSIS — E861 Hypovolemia: Secondary | ICD-10-CM | POA: Diagnosis not present

## 2015-05-21 DIAGNOSIS — Z720 Tobacco use: Secondary | ICD-10-CM | POA: Diagnosis present

## 2015-05-21 DIAGNOSIS — R4182 Altered mental status, unspecified: Secondary | ICD-10-CM | POA: Diagnosis present

## 2015-05-21 DIAGNOSIS — E1151 Type 2 diabetes mellitus with diabetic peripheral angiopathy without gangrene: Secondary | ICD-10-CM | POA: Diagnosis not present

## 2015-05-21 DIAGNOSIS — G9340 Encephalopathy, unspecified: Secondary | ICD-10-CM | POA: Diagnosis present

## 2015-05-21 DIAGNOSIS — D62 Acute posthemorrhagic anemia: Secondary | ICD-10-CM | POA: Diagnosis present

## 2015-05-21 DIAGNOSIS — F17210 Nicotine dependence, cigarettes, uncomplicated: Secondary | ICD-10-CM | POA: Diagnosis not present

## 2015-05-21 DIAGNOSIS — Z7984 Long term (current) use of oral hypoglycemic drugs: Secondary | ICD-10-CM

## 2015-05-21 DIAGNOSIS — E1165 Type 2 diabetes mellitus with hyperglycemia: Secondary | ICD-10-CM

## 2015-05-21 DIAGNOSIS — E669 Obesity, unspecified: Secondary | ICD-10-CM | POA: Diagnosis not present

## 2015-05-21 HISTORY — DX: Supraventricular tachycardia: I47.1

## 2015-05-21 HISTORY — DX: Atrioventricular block, second degree: I44.1

## 2015-05-21 HISTORY — DX: Other specified disorders of brain: G93.89

## 2015-05-21 HISTORY — DX: Abdominal aortic aneurysm, without rupture: I71.4

## 2015-05-21 HISTORY — DX: Paroxysmal atrial fibrillation: I48.0

## 2015-05-21 LAB — CBC WITH DIFFERENTIAL/PLATELET
BASOS ABS: 0.1 10*3/uL (ref 0.0–0.1)
Basophils Relative: 0 %
EOS ABS: 0.1 10*3/uL (ref 0.0–0.7)
EOS PCT: 0 %
HCT: 22.5 % — ABNORMAL LOW (ref 39.0–52.0)
Hemoglobin: 7.6 g/dL — ABNORMAL LOW (ref 13.0–17.0)
LYMPHS PCT: 16 %
Lymphs Abs: 1.8 10*3/uL (ref 0.7–4.0)
MCH: 32.6 pg (ref 26.0–34.0)
MCHC: 33.8 g/dL (ref 30.0–36.0)
MCV: 96.6 fL (ref 78.0–100.0)
Monocytes Absolute: 1.4 10*3/uL — ABNORMAL HIGH (ref 0.1–1.0)
Monocytes Relative: 12 %
Neutro Abs: 8.4 10*3/uL — ABNORMAL HIGH (ref 1.7–7.7)
Neutrophils Relative %: 72 %
PLATELETS: 223 10*3/uL (ref 150–400)
RBC: 2.33 MIL/uL — AB (ref 4.22–5.81)
RDW: 13.7 % (ref 11.5–15.5)
WBC: 11.6 10*3/uL — AB (ref 4.0–10.5)

## 2015-05-21 LAB — COMPREHENSIVE METABOLIC PANEL
ALBUMIN: 2.5 g/dL — AB (ref 3.5–5.0)
ALT: 45 U/L (ref 17–63)
AST: 62 U/L — AB (ref 15–41)
Alkaline Phosphatase: 68 U/L (ref 38–126)
Anion gap: 8 (ref 5–15)
BUN: 62 mg/dL — AB (ref 6–20)
CHLORIDE: 99 mmol/L — AB (ref 101–111)
CO2: 28 mmol/L (ref 22–32)
Calcium: 7.8 mg/dL — ABNORMAL LOW (ref 8.9–10.3)
Creatinine, Ser: 1.21 mg/dL (ref 0.61–1.24)
GFR calc Af Amer: >60 mL/min (ref >60)
GFR calc non Af Amer: >60 mL/min (ref >60)
GLUCOSE: 196 mg/dL — AB (ref 65–99)
POTASSIUM: 3.8 mmol/L (ref 3.5–5.1)
Sodium: 135 mmol/L (ref 135–145)
Total Bilirubin: 0.4 mg/dL (ref 0.3–1.2)
Total Protein: 5.4 g/dL — ABNORMAL LOW (ref 6.5–8.1)

## 2015-05-21 LAB — ABO/RH: ABO/RH(D): O POS

## 2015-05-21 LAB — PREPARE RBC (CROSSMATCH)

## 2015-05-21 LAB — TROPONIN I

## 2015-05-21 LAB — I-STAT CG4 LACTIC ACID, ED: Lactic Acid, Venous: 1.53 mmol/L (ref 0.5–2.0)

## 2015-05-21 LAB — AMMONIA: Ammonia: 20 umol/L (ref 9–35)

## 2015-05-21 LAB — LIPASE, BLOOD: Lipase: 59 U/L — ABNORMAL HIGH (ref 11–51)

## 2015-05-21 MED ORDER — INSULIN ASPART 100 UNIT/ML ~~LOC~~ SOLN
0.0000 [IU] | Freq: Every day | SUBCUTANEOUS | Status: DC
  Administered 2015-05-25: 0 [IU] via SUBCUTANEOUS
  Administered 2015-05-28 – 2015-05-29 (×2): 2 [IU] via SUBCUTANEOUS

## 2015-05-21 MED ORDER — ACETAMINOPHEN 325 MG PO TABS: 650.0000 mg | ORAL_TABLET | Freq: Four times a day (QID) | ORAL | Status: DC | PRN

## 2015-05-21 MED ORDER — SODIUM CHLORIDE 0.9% FLUSH
3.0000 mL | Freq: Two times a day (BID) | INTRAVENOUS | Status: DC
  Administered 2015-05-21 – 2015-05-23 (×4): 3 mL via INTRAVENOUS

## 2015-05-21 MED ORDER — ONDANSETRON HCL 4 MG/2ML IJ SOLN: 4.0000 mg | Freq: Three times a day (TID) | INTRAMUSCULAR | Status: AC | PRN

## 2015-05-21 MED ORDER — SODIUM CHLORIDE 0.9 % IV BOLUS (SEPSIS)
1000.0000 mL | Freq: Once | INTRAVENOUS | Status: AC
  Administered 2015-05-21: 1000 mL via INTRAVENOUS

## 2015-05-21 MED ORDER — SODIUM CHLORIDE 0.45 % IV SOLN
INTRAVENOUS | Status: DC
  Administered 2015-05-22: 03:00:00 via INTRAVENOUS

## 2015-05-21 MED ORDER — ONDANSETRON HCL 4 MG/2ML IJ SOLN
4.0000 mg | Freq: Once | INTRAMUSCULAR | Status: AC
  Administered 2015-05-21: 4 mg via INTRAVENOUS
  Filled 2015-05-21: qty 2

## 2015-05-21 MED ORDER — ONDANSETRON HCL 4 MG PO TABS: 4.0000 mg | ORAL_TABLET | Freq: Four times a day (QID) | ORAL | Status: DC | PRN

## 2015-05-21 MED ORDER — SIMVASTATIN 20 MG PO TABS: 40.0000 mg | ORAL_TABLET | Freq: Every day | ORAL | Status: DC

## 2015-05-21 MED ORDER — ONDANSETRON HCL 4 MG/2ML IJ SOLN: 4.0000 mg | Freq: Four times a day (QID) | INTRAMUSCULAR | Status: DC | PRN

## 2015-05-21 MED ORDER — SODIUM CHLORIDE 0.9 % IV SOLN: INTRAVENOUS | Status: DC

## 2015-05-21 MED ORDER — ASPIRIN EC 325 MG PO TBEC: 325.0000 mg | DELAYED_RELEASE_TABLET | Freq: Every day | ORAL | Status: DC

## 2015-05-21 MED ORDER — METOPROLOL TARTRATE 25 MG PO TABS: 25.0000 mg | ORAL_TABLET | Freq: Two times a day (BID) | ORAL | Status: DC

## 2015-05-21 MED ORDER — PNEUMOCOCCAL VAC POLYVALENT 25 MCG/0.5ML IJ INJ
0.5000 mL | INJECTION | INTRAMUSCULAR | Status: AC
  Administered 2015-05-22: 0.5 mL via INTRAMUSCULAR
  Filled 2015-05-21: qty 0.5

## 2015-05-21 MED ORDER — QUINAPRIL HCL 10 MG PO TABS
20.0000 mg | ORAL_TABLET | Freq: Every day | ORAL | Status: DC
  Filled 2015-05-21: qty 2

## 2015-05-21 MED ORDER — SODIUM CHLORIDE 0.9 % IV SOLN
Freq: Once | INTRAVENOUS | Status: AC
  Administered 2015-05-21: 20:00:00 via INTRAVENOUS

## 2015-05-21 MED ORDER — DILTIAZEM HCL ER COATED BEADS 180 MG PO CP24: 360.0000 mg | ORAL_CAPSULE | ORAL | Status: DC

## 2015-05-21 MED ORDER — INSULIN ASPART 100 UNIT/ML ~~LOC~~ SOLN
0.0000 [IU] | Freq: Three times a day (TID) | SUBCUTANEOUS | Status: DC
  Administered 2015-05-22 – 2015-05-24 (×6): 1 [IU] via SUBCUTANEOUS
  Administered 2015-05-25: 2 [IU] via SUBCUTANEOUS
  Administered 2015-05-26: 3 [IU] via SUBCUTANEOUS
  Administered 2015-05-27 (×2): 1 [IU] via SUBCUTANEOUS
  Administered 2015-05-28: 2 [IU] via SUBCUTANEOUS
  Administered 2015-05-28 – 2015-05-29 (×2): 1 [IU] via SUBCUTANEOUS
  Administered 2015-05-29: 3 [IU] via SUBCUTANEOUS
  Administered 2015-05-30: 2 [IU] via SUBCUTANEOUS
  Administered 2015-05-30 – 2015-05-31 (×3): 1 [IU] via SUBCUTANEOUS
  Administered 2015-05-31 – 2015-06-01 (×3): 2 [IU] via SUBCUTANEOUS

## 2015-05-21 MED ORDER — ACETAMINOPHEN 650 MG RE SUPP: 650.0000 mg | Freq: Four times a day (QID) | RECTAL | Status: DC | PRN

## 2015-05-21 NOTE — ED Notes (Signed)
Cleaned pt from two large episodes of loose, black, tarry stool. RN aware

## 2015-05-21 NOTE — H&P (Signed)
[facility] History and Physical  Dustin Estes. BY:[id number] DOB: Mar 09, 1951 DOA: 05/21/2015  Referring physician: Dr Dustin Estes - [facility] PCP: Dustin Shire, MD   Chief Complaint: altered mental status  HPI: Dustin Estes. is a 65 y.o. male  Level V caveat: Patient presenting an altered mental state versus simply being non-participatory exam. Patient times appears to be only answer questions appropriately but then other times ignores questions being asked  Or answers them in an odd manner.  History provided by EDP and reported by family and EMS.  Patient was at home with family when he suddenly became weak and slumped over and refused to respond to family members or get up. Patient refused to come to the emergency room therefore the daughter went to the magistrate and obtained paperwork authorizing her to force him into the hospital. Family reports malonic stools at home over the last few days. There are no other reported symptoms or history.ED workup concerning for GI bleed  NSAID use and last colonoscopy unknown.   Review of Systems:  To obtain further due to patient's mental status and family not being available for providing of history. Patient not willing to participate in the admission process.  Attempts to reach family by phone were not answered.   Past Medical History  Diagnosis Date  . Dysrhythmia   . Diabetes mellitus   . Sleep apnea   . 2Nd degree AV block 04/29/2011    Medtronic Adapta  . PAD (peripheral artery disease) ([place])   . Abdominal aortic aneurysm (HCC)     4.1 x 4.3 cm  . Hypertension   . Obesity   . Tobacco abuse   . OSA (obstructive sleep apnea)    Past Surgical History  Procedure Laterality Date  . Permanent pacemaker insertion  04/29/2011    Medtronic Adapta  . Brain surgery    . Appendectomy  12/69  . Bladder surgery  9/04,11/05,9/07    tumor removal  . US echocardiography  04/13/2010    mild asymmetric LVH, EF >55%,mild mitral  ca+,AOV mildly sclerotic  . Nm myocar perf wall motion  04/13/2010    moderate, mostly fixed  inferoseptal defect, suspicious for artifact.  . Permanent pacemaker insertion N/A 04/29/2011    Procedure: PERMANENT PACEMAKER INSERTION;  Surgeon: Dustin Klein, MD;  Location: [place] [facility];  Service: Cardiovascular;  Laterality: N/A;  . Loop recorder explant  04/29/2011    Procedure: LOOP RECORDER EXPLANT;  Surgeon: Dustin Klein, MD;  Location: [place] [facility];  Service: Cardiovascular;;   Social History:  reports that he has been smoking Cigarettes.  He has a 94 pack-year smoking history. He has never used smokeless tobacco. He reports that he drinks alcohol. He reports that he does not use illicit drugs.  No Known Allergies  Family History  Problem Relation Age of Onset  . Diabetes Mother   . Diabetes Father   . Stroke Father      Prior to Admission medications   Medication Sig Start Date End Date Taking? Authorizing Provider  aspirin EC 325 MG tablet Take 325 mg by mouth at bedtime.     Yes Historical Provider, MD  diltiazem (CARDIZEM CD) 360 MG 24 hr capsule Take 360 mg by mouth every morning.     Yes Historical Provider, MD  fenofibrate 160 MG tablet Take 160 mg by mouth at bedtime.     Yes Historical Provider, MD  Flaxseed, Linseed, 1000 MG CAPS Take 1 capsule by mouth 2 (  two) times daily.    Yes Historical Provider, MD  glipiZIDE (GLUCOTROL) 10 MG tablet Take 10 mg by mouth every morning.     Yes Historical Provider, MD  metFORMIN (GLUCOPHAGE) 1000 MG tablet Take 1,000 mg by mouth 2 (two) times daily with a meal.  02/25/14  Yes Historical Provider, MD  metoprolol tartrate (LOPRESSOR) 25 MG tablet Take 25 mg by mouth 2 (two) times daily.   Yes Historical Provider, MD  Multiple Vitamin (MULITIVITAMIN WITH MINERALS) TABS Take 1 tablet by mouth at bedtime. Centrum Silver   Yes Historical Provider, MD  niacin 500 MG tablet Take 500 mg by mouth 2 (two) times daily with a meal.     Yes Historical  Provider, MD  quinapril (ACCUPRIL) 20 MG tablet TAKE ONE TABLET BY MOUTH DAILY 12/16/13  Yes Dustin Casino, MD  simvastatin (ZOCOR) 40 MG tablet Take 40 mg by mouth at bedtime.    Yes Historical Provider, MD   Physical Exam: Filed Vitals:   05/21/15 2000 05/21/15 2100 05/21/15 2137 05/21/15 2158  BP: 103/69 105/69 98/66 98/66   Pulse: 90 91 89 91  Temp:    98.8 F (37.1 C)  TempSrc:    Oral  Resp:  18 17 20   Height:      Weight:    81.4 kg (179 lb 7.3 oz)  SpO2: 91% 91% 90% 93%    Wt Readings from Last 3 Encounters:  05/21/15 81.4 kg (179 lb 7.3 oz)  12/02/14 98.431 kg (217 lb)  03/18/14 104.463 kg (230 lb 4.8 oz)     General:  Appears calm and comfortable Eyes:  PERRL, EOMI, normal lids, iris ENT:  grossly normal hearing, lips & tongue Neck:  no LAD, masses or thyromegaly Cardiovascular:  RRR, no m/r/g. No LE edema.  Respiratory:  CTA bilaterally, no w/r/r. Normal respiratory effort. Abdomen:  soft, ntnd Skin:  no rash or induration seen on limited exam Musculoskeletal:  grossly normal tone BUE/BLE Psychiatric: minimally participatory. Answers some questions correctly but then ignores other questions branches of an appropriately.she appears to be somewhat irritated by being bothered. Neurologic:  CN 2-12 grossly intact, moves all extremities in coordinated fashion.          Labs on Admission:  Basic Metabolic Panel:  Recent Labs Lab 05/21/15 1838  NA 135  K 3.8  CL 99*  CO2 28  GLUCOSE 196*  BUN 62*  CREATININE 1.21  CALCIUM 7.8*   Liver Function Tests:  Recent Labs Lab 05/21/15 1838  AST 62*  ALT 45  ALKPHOS 68  BILITOT 0.4  PROT 5.4*  ALBUMIN 2.5*    Recent Labs Lab 05/21/15 1838  LIPASE 59*    Recent Labs Lab 05/21/15 1838  AMMONIA 20   CBC:  Recent Labs Lab 05/21/15 1838  WBC 11.6*  NEUTROABS 8.4*  HGB 7.6*  HCT 22.5*  MCV 96.6  PLT 223   Cardiac Enzymes:  Recent Labs Lab 05/21/15 1838  TROPONINI <0.03    BNP (last  3 results) No results for input(s): BNP in the last 8760 hours.  ProBNP (last 3 results) No results for input(s): PROBNP in the last 8760 hours.   CREATININE: 1.21 (05/21/15 1838) Estimated creatinine clearance - 60.9 mL/min  CBG: No results for input(s): GLUCAP in the last 168 hours.  Radiological Exams on Admission: Ct Head Wo Contrast  05/21/2015  CLINICAL DATA:  EMS reports pt drank 9 raw eggs this morning for unknown reason. Pt's family reported to EMS  that pt is usually able to ambulate but hasn't been able to lately and fell to the floor today and was unable to get up. Pt's family also reported to EMS that pt hasn't ate or drank for several weeks. EXAM: CT HEAD WITHOUT CONTRAST TECHNIQUE: Contiguous axial images were obtained from the base of the skull through the vertex without intravenous contrast. COMPARISON:  03/24/2010 FINDINGS: Ventricles are normal configuration. There is ventricular and sulcal enlargement reflecting mild atrophy. No hydrocephalus. There are no parenchymal masses or mass effect. Encephalomalacia along the lateral left frontal lobe is from a previous hemorrhage. There is an overlying craniotomy flap. There is hypoattenuation consistent with additional encephalomalacia along the anterior, inferior frontal lobes bilaterally, likely from old trauma. There is no evidence of a recent cortical infarct. There are no extra-axial masses or abnormal fluid collections. There is no intracranial hemorrhage. Visualized sinuses and mastoid air cells are clear.  The IMPRESSION: 1. No acute intracranial abnormalities. 2. Status post left sided craniotomy. There is a focal area of left lateral frontal lobe encephalomalacia from previous hemorrhage and subsequent surgery. 3. Anterior inferior frontal lobe encephalomalacia likely from remote trauma. 4. Mild atrophy. Electronically Signed   By: Lajean Manes M.D.   On: 05/21/2015 19:01   Dg Chest Port 1 View  05/21/2015  CLINICAL DATA:   Altered mental status.  Fell today. EXAM: PORTABLE CHEST 1 VIEW COMPARISON:  04/30/2011 FINDINGS: Dual lead left-sided pacemaker in place. Leads project over the right atrium and ventricle. The cardiomediastinal contours are normal. Pulmonary vasculature is normal. No consolidation, pleural effusion, or pneumothorax. No acute osseous abnormalities are seen. IMPRESSION: No acute pulmonary process. Electronically Signed   By: Jeb Levering M.D.   On: 05/21/2015 19:42      Assessment/Plan Active Problems:   HTN (hypertension)   DM2 (diabetes mellitus, type 2) (HCC)   Dyslipidemia   Pacemaker   Second degree AV block, Mobitz type II   Upper GI bleed   Near syncope   Acute encephalopathy   Acute encephalopathy: etiology not immediately clear. Suspect multifactorial process including psychosomatic andacute anemia with underlying abnormal brain anatomy due to previous intracranial hemorrhage and craniotomy.Reubin Milan exclude cardiac etiology or infection as unable to obtain UA at this time. WBC 11.6 (suspect from his urologic stress from acute anemia), lactic acid 1.53, AF VSS, troponin normal, no significant metabolic derangements, - telemetry - Transfusion as below - UA - UC X, B CX - Hold antibiotics unless patient deteriorates - Echo  Acute GI bleed: Suspect upper GI bleed due to BUN creatinine ratio. 2 units PRBC ordered by ED. GI consult and will follow patient in the a.m. - FOBT  - Protonix drip - Follow-up GI recommendations - Clear liquid diet, nothing by mouth after midnight  HTN: h/o 2nd degree AV block w/ pacemaker placement 2013 - continue metop, accupril, dilt, ASA  DM: - SSI - A1c  HLD: - continue statin      Code Status: FULL  DVT Prophylaxis: SCD Family Communication: none available. Attempted to call w/o answer Disposition Plan: Pending Improvement    Patrina Andreas Lenna Sciara, MD Family Medicine [facility] [email or web address] Password [email or web address]

## 2015-05-21 NOTE — ED Provider Notes (Signed)
CSN: [id number]     Arrival date & time 05/21/15  1705 History   First MD Initiated Contact with Patient 05/21/15 1715     Chief Complaint  Patient presents with  . Altered Mental Status     (Consider location/radiation/quality/duration/timing/severity/associated sxs/prior Treatment) HPI  Pt is 71, lives by himself - multiple comorbidities including AAA, PAD, DM, 2nd deg AV block with pacer placement - he has had brain surgery, bladder surgery for a tumor and has had several weeks of FTT - not feeling well, poor historian and altered today - family reports that today he was weak and slumped over on the house and refused to move or get up - they brough him to the bed and the daughter had to go to the magistrate to get papers to bring him to the hospital - he states that he had eaten 9 raw eggs this morning b/c "balboa did it".  Referencing [place].  He denies complaint and states he wants something to drink and wants to sleep.    Past Medical History  Diagnosis Date  . Dysrhythmia   . Diabetes mellitus   . Sleep apnea   . 2Nd degree AV block 04/29/2011    Medtronic Adapta  . PAD (peripheral artery disease) ([place])   . Abdominal aortic aneurysm (HCC)     4.1 x 4.3 cm  . Hypertension   . Obesity   . Tobacco abuse   . OSA (obstructive sleep apnea)    Past Surgical History  Procedure Laterality Date  . Permanent pacemaker insertion  04/29/2011    Medtronic Adapta  . Brain surgery    . Appendectomy  12/69  . Bladder surgery  9/04,11/05,9/07    tumor removal  . US echocardiography  04/13/2010    mild asymmetric LVH, EF >55%,mild mitral ca+,AOV mildly sclerotic  . Nm myocar perf wall motion  04/13/2010    moderate, mostly fixed  inferoseptal defect, suspicious for artifact.  . Permanent pacemaker insertion N/A 04/29/2011    Procedure: PERMANENT PACEMAKER INSERTION;  Surgeon: Sanda Klein, MD;  Location: [facility];  Service: Cardiovascular;  Laterality: N/A;  . Loop recorder explant   04/29/2011    Procedure: LOOP RECORDER EXPLANT;  Surgeon: Sanda Klein, MD;  Location: [facility];  Service: Cardiovascular;;   Family History  Problem Relation Age of Onset  . Diabetes Mother   . Diabetes Father   . Stroke Father    Social History  Substance Use Topics  . Smoking status: Current Every Day Smoker -- 2.00 packs/day for 47 years    Types: Cigarettes  . Smokeless tobacco: Never Used     Comment: 4 (10packs per caroton) cartons/month (01/13/14)  . Alcohol Use: Yes     Comment: "hardly ever"    Review of Systems  Unable to perform ROS: Mental status change      Allergies  Review of patient's allergies indicates no known allergies.  Home Medications   Prior to Admission medications   Medication Sig Start Date End Date Taking? Authorizing Provider  aspirin EC 325 MG tablet Take 325 mg by mouth at bedtime.     Yes Historical Provider, MD  diltiazem (CARDIZEM CD) 360 MG 24 hr capsule Take 360 mg by mouth every morning.     Yes Historical Provider, MD  fenofibrate 160 MG tablet Take 160 mg by mouth at bedtime.     Yes Historical Provider, MD  Flaxseed, Linseed, 1000 MG CAPS Take 1 capsule by mouth 2 (  two) times daily.    Yes Historical Provider, MD  glipiZIDE (GLUCOTROL) 10 MG tablet Take 10 mg by mouth every morning.     Yes Historical Provider, MD  metFORMIN (GLUCOPHAGE) 1000 MG tablet Take 1,000 mg by mouth 2 (two) times daily with a meal.  02/25/14  Yes Historical Provider, MD  metoprolol tartrate (LOPRESSOR) 25 MG tablet Take 25 mg by mouth 2 (two) times daily.   Yes Historical Provider, MD  Multiple Vitamin (MULITIVITAMIN WITH MINERALS) TABS Take 1 tablet by mouth at bedtime. Centrum Silver   Yes Historical Provider, MD  niacin 500 MG tablet Take 500 mg by mouth 2 (two) times daily with a meal.     Yes Historical Provider, MD  quinapril (ACCUPRIL) 20 MG tablet TAKE ONE TABLET BY MOUTH DAILY 12/16/13  Yes Pixie Casino, MD  simvastatin (ZOCOR) 40 MG tablet Take  40 mg by mouth at bedtime.    Yes Historical Provider, MD   BP 103/69 mmHg  Pulse 90  Temp(Src) 98.4 F (36.9 C) (Oral)  Resp 22  Ht 5\' 9"  (1.753 m)  Wt 200 lb (90.719 kg)  BMI 29.52 kg/m2  SpO2 91% Physical Exam  Constitutional: He appears well-developed and well-nourished. He appears distressed.  HENT:  Head: Normocephalic and atraumatic.  Mouth/Throat: No oropharyngeal exudate.  Dry MM  Eyes: Conjunctivae and EOM are normal. Pupils are equal, round, and reactive to light. Right eye exhibits no discharge. Left eye exhibits no discharge. No scleral icterus.  Neck: Normal range of motion. Neck supple. No JVD present. No thyromegaly present.  Cardiovascular: Normal rate, regular rhythm, normal heart sounds and intact distal pulses.  Exam reveals no gallop and no friction rub.   No murmur heard. Pulmonary/Chest: Effort normal. No respiratory distress. He has wheezes ( wheezing anterior lungs). He has no rales.  Abdominal: Soft. Bowel sounds are normal. He exhibits no distension and no mass. There is no tenderness.  No pulsating mass  Musculoskeletal: Normal range of motion. He exhibits no edema or tenderness.  Lymphadenopathy:    He has no cervical adenopathy.  Neurological:  Somnolent, follows commands but severely diffusly weak, spech is quiet but appropriate most of the time.  Skin: Skin is warm and dry. No rash noted. No erythema.  Scaling, dry, no petechiae or purpura  Psychiatric: He has a normal mood and affect. His behavior is normal.  Nursing note and vitals reviewed.   ED Course  Procedures (including critical care time) Labs Review Labs Reviewed  CBC WITH DIFFERENTIAL/PLATELET - Abnormal; Notable for the following:    WBC 11.6 (*)    RBC 2.33 (*)    Hemoglobin 7.6 (*)    HCT 22.5 (*)    Neutro Abs 8.4 (*)    Monocytes Absolute 1.4 (*)    All other components within normal limits  COMPREHENSIVE METABOLIC PANEL - Abnormal; Notable for the following:    Chloride  99 (*)    Glucose, Bld 196 (*)    BUN 62 (*)    Calcium 7.8 (*)    Total Protein 5.4 (*)    Albumin 2.5 (*)    AST 62 (*)    All other components within normal limits  LIPASE, BLOOD - Abnormal; Notable for the following:    Lipase 59 (*)    All other components within normal limits  URINE CULTURE  AMMONIA  TROPONIN I  I-STAT CG4 LACTIC ACID, ED  I-STAT CG4 LACTIC ACID, ED  PREPARE RBC (CROSSMATCH)  TYPE AND SCREEN    Imaging Review Ct Head Wo Contrast  05/21/2015  CLINICAL DATA:  EMS reports pt drank 9 raw eggs this morning for unknown reason. Pt's family reported to EMS that pt is usually able to ambulate but hasn't been able to lately and fell to the floor today and was unable to get up. Pt's family also reported to EMS that pt hasn't ate or drank for several weeks. EXAM: CT HEAD WITHOUT CONTRAST TECHNIQUE: Contiguous axial images were obtained from the base of the skull through the vertex without intravenous contrast. COMPARISON:  03/24/2010 FINDINGS: Ventricles are normal configuration. There is ventricular and sulcal enlargement reflecting mild atrophy. No hydrocephalus. There are no parenchymal masses or mass effect. Encephalomalacia along the lateral left frontal lobe is from a previous hemorrhage. There is an overlying craniotomy flap. There is hypoattenuation consistent with additional encephalomalacia along the anterior, inferior frontal lobes bilaterally, likely from old trauma. There is no evidence of a recent cortical infarct. There are no extra-axial masses or abnormal fluid collections. There is no intracranial hemorrhage. Visualized sinuses and mastoid air cells are clear.  The IMPRESSION: 1. No acute intracranial abnormalities. 2. Status post left sided craniotomy. There is a focal area of left lateral frontal lobe encephalomalacia from previous hemorrhage and subsequent surgery. 3. Anterior inferior frontal lobe encephalomalacia likely from remote trauma. 4. Mild atrophy.  Electronically Signed   By: Lajean Manes M.D.   On: 05/21/2015 19:01   Dg Chest Port 1 View  05/21/2015  CLINICAL DATA:  Altered mental status.  Fell today. EXAM: PORTABLE CHEST 1 VIEW COMPARISON:  04/30/2011 FINDINGS: Dual lead left-sided pacemaker in place. Leads project over the right atrium and ventricle. The cardiomediastinal contours are normal. Pulmonary vasculature is normal. No consolidation, pleural effusion, or pneumothorax. No acute osseous abnormalities are seen. IMPRESSION: No acute pulmonary process. Electronically Signed   By: Jeb Levering M.D.   On: 05/21/2015 19:42   I have personally reviewed and evaluated these images and lab results as part of my medical decision-making.   EKG Interpretation   Date/Time:  Thursday May 21 2015 17:20:19 EST Ventricular Rate:  100 PR Interval:  147 QRS Duration: 159 QT Interval:  390 QTC Calculation: 503 R Axis:   -77 Text Interpretation:  Sinus tachycardia RBBB and LAFB Baseline wander in  lead(s) V3 since last tracing no significant change Confirmed by Yidel Teuscher   MD, Mikaiya Tramble ([id number]) on 05/21/2015 5:28:43 PM      MDM   Final diagnoses:  Upper GI bleed  Severe anemia    The pt has normla VS other than mild tachycardia Has abnormal MS, could be related to electrolyte abnormlaities, renal failure, or multiple other etiologies =- he has been falling recently as well - CT head, labs,  Bolus, fluids, recheck.   the patient has had ongoing generalized weakness,  he has a severe anemia, this is likely related to GI bleeding as he does have melanotic stool  CRITICAL CARE Performed by: Johnna Acosta Total critical care time: 35 minutes Critical care time was exclusive of separately billable procedures and treating other patients. Critical care was necessary to treat or prevent imminent or life-threatening deterioration. Critical care was time spent personally by me on the following activities: development of treatment plan with  patient and/or surrogate as well as nursing, discussions with consultants, evaluation of patient's response to treatment, examination of patient, obtaining history from patient or surrogate, ordering and performing treatments and interventions, ordering and review of  laboratory studies, ordering and review of radiographic studies, pulse oximetry and re-evaluation of patient's condition.   Noemi Chapel, MD 05/21/15 2100

## 2015-05-21 NOTE — ED Notes (Addendum)
Pt brought in by [facility]. EMS reports pt drank 9 raw eggs this morning for unknown reason. Pt's family reported to EMS that pt is usually able to ambulate but hasn't been able to lately and fell to the floor today and was unable to get up. Pt's family also reported to EMS that pt hasn't ate or drank for several weeks. EMS was called out to pt's house around 1100 and pt refused to come to ED despite EMS encouraging him to due to possible dehydration. Daughter went to [employer] office and got custody for pt to come to ED despite pt wanting to.[facility] EMS returned to pt's home and brought pt to ED. Pt has 20g lt hand and given 800cc of NS bolus per EMS, BP 98/60 for EMS.

## 2015-05-21 NOTE — ED Notes (Signed)
Pt assisted restroom. Dark stool noted in undergarments.

## 2015-05-21 NOTE — ED Notes (Signed)
Pt unable to give urine specimen at this time 

## 2015-05-22 ENCOUNTER — Inpatient Hospital Stay (HOSPITAL_COMMUNITY): Payer: Medicare Other | Admitting: Anesthesiology

## 2015-05-22 ENCOUNTER — Encounter (HOSPITAL_COMMUNITY)
Admission: EM | Disposition: A | Discharge status: Home / Self Care | Payer: Self-pay | Source: Home / Self Care | Attending: Internal Medicine

## 2015-05-22 ENCOUNTER — Encounter (HOSPITAL_COMMUNITY): Payer: Self-pay | Admitting: *Deleted

## 2015-05-22 ENCOUNTER — Inpatient Hospital Stay (HOSPITAL_COMMUNITY): Payer: Medicare Other

## 2015-05-22 DIAGNOSIS — G9340 Encephalopathy, unspecified: Secondary | ICD-10-CM

## 2015-05-22 DIAGNOSIS — R55 Syncope and collapse: Secondary | ICD-10-CM

## 2015-05-22 DIAGNOSIS — K264 Chronic or unspecified duodenal ulcer with hemorrhage: Secondary | ICD-10-CM | POA: Diagnosis not present

## 2015-05-22 DIAGNOSIS — K922 Gastrointestinal hemorrhage, unspecified: Secondary | ICD-10-CM

## 2015-05-22 DIAGNOSIS — I10 Essential (primary) hypertension: Secondary | ICD-10-CM

## 2015-05-22 DIAGNOSIS — D649 Anemia, unspecified: Secondary | ICD-10-CM

## 2015-05-22 DIAGNOSIS — N179 Acute kidney failure, unspecified: Secondary | ICD-10-CM

## 2015-05-22 HISTORY — PX: ESOPHAGOGASTRODUODENOSCOPY: SHX5428

## 2015-05-22 LAB — COMPREHENSIVE METABOLIC PANEL
ALK PHOS: 50 U/L (ref 38–126)
ALT: 39 U/L (ref 17–63)
AST: 39 U/L (ref 15–41)
Albumin: 2.7 g/dL — ABNORMAL LOW (ref 3.5–5.0)
Anion gap: 5 (ref 5–15)
BILIRUBIN TOTAL: 0.9 mg/dL (ref 0.3–1.2)
BUN: 39 mg/dL — AB (ref 6–20)
CO2: 30 mmol/L (ref 22–32)
CREATININE: 1.11 mg/dL (ref 0.61–1.24)
Calcium: 7.9 mg/dL — ABNORMAL LOW (ref 8.9–10.3)
Chloride: 101 mmol/L (ref 101–111)
GFR calc Af Amer: >60 mL/min (ref >60)
Glucose, Bld: 156 mg/dL — ABNORMAL HIGH (ref 65–99)
Potassium: 3.8 mmol/L (ref 3.5–5.1)
Sodium: 136 mmol/L (ref 135–145)
Total Protein: 5.7 g/dL — ABNORMAL LOW (ref 6.5–8.1)

## 2015-05-22 LAB — GLUCOSE, CAPILLARY
GLUCOSE-CAPILLARY: 147 mg/dL — AB (ref 65–99)
GLUCOSE-CAPILLARY: 133 mg/dL — AB (ref 65–99)
GLUCOSE-CAPILLARY: 138 mg/dL — AB (ref 65–99)
GLUCOSE-CAPILLARY: 151 mg/dL — AB (ref 65–99)

## 2015-05-22 LAB — CBC
HCT: 29 % — ABNORMAL LOW (ref 39.0–52.0)
Hemoglobin: 9.8 g/dL — ABNORMAL LOW (ref 13.0–17.0)
MCH: 31.8 pg (ref 26.0–34.0)
MCHC: 33.8 g/dL (ref 30.0–36.0)
MCV: 94.2 fL (ref 78.0–100.0)
Platelets: 211 10*3/uL (ref 150–400)
RBC: 3.08 MIL/uL — ABNORMAL LOW (ref 4.22–5.81)
RDW: 14.3 % (ref 11.5–15.5)
WBC: 12.3 10*3/uL — ABNORMAL HIGH (ref 4.0–10.5)

## 2015-05-22 LAB — HEMOGLOBIN AND HEMATOCRIT, BLOOD
HCT: 28.4 % — ABNORMAL LOW (ref 39.0–52.0)
HEMATOCRIT: 26.6 % — AB (ref 39.0–52.0)
HEMOGLOBIN: 9.2 g/dL — AB (ref 13.0–17.0)
Hemoglobin: 9.5 g/dL — ABNORMAL LOW (ref 13.0–17.0)

## 2015-05-22 LAB — APTT: aPTT: 28 seconds (ref 24–37)

## 2015-05-22 LAB — PROTIME-INR
INR: 1.27 (ref 0.00–1.49)
Prothrombin Time: 16 seconds — ABNORMAL HIGH (ref 11.6–15.2)

## 2015-05-22 SURGERY — EGD (ESOPHAGOGASTRODUODENOSCOPY)
Anesthesia: Moderate Sedation

## 2015-05-22 MED ORDER — METOCLOPRAMIDE HCL 5 MG/ML IJ SOLN
INTRAMUSCULAR | Status: DC | PRN
  Administered 2015-05-22: 20 mg via INTRAVENOUS

## 2015-05-22 MED ORDER — EPINEPHRINE HCL 0.1 MG/ML IJ SOSY
PREFILLED_SYRINGE | INTRAMUSCULAR | Status: AC
  Filled 2015-05-22: qty 10

## 2015-05-22 MED ORDER — SODIUM CHLORIDE 0.9 % IV SOLN
INTRAVENOUS | Status: DC
  Administered 2015-05-22 – 2015-05-23 (×3): via INTRAVENOUS

## 2015-05-22 MED ORDER — MIDAZOLAM HCL 5 MG/5ML IJ SOLN
INTRAMUSCULAR | Status: DC | PRN
  Administered 2015-05-22 (×4): 1 mg via INTRAVENOUS

## 2015-05-22 MED ORDER — SODIUM CHLORIDE 0.9 % IV SOLN
8.0000 mg/h | INTRAVENOUS | Status: DC
  Administered 2015-05-22: 8 mg/h via INTRAVENOUS
  Filled 2015-05-22 (×8): qty 80

## 2015-05-22 MED ORDER — SODIUM CHLORIDE 0.9 % IV SOLN: 80.0000 mg | Freq: Once | INTRAVENOUS | Status: DC

## 2015-05-22 MED ORDER — MEPERIDINE HCL 100 MG/ML IJ SOLN
INTRAMUSCULAR | Status: AC
  Filled 2015-05-22: qty 2

## 2015-05-22 MED ORDER — SODIUM CHLORIDE 0.9 % IV BOLUS (SEPSIS)
500.0000 mL | Freq: Once | INTRAVENOUS | Status: AC
  Administered 2015-05-22: 500 mL via INTRAVENOUS

## 2015-05-22 MED ORDER — LIDOCAINE VISCOUS 2 % MT SOLN
OROMUCOSAL | Status: DC | PRN
Start: 2015-05-22 — End: 2015-05-22
  Administered 2015-05-22: 1 via OROMUCOSAL

## 2015-05-22 MED ORDER — SODIUM CHLORIDE 0.9% FLUSH
INTRAVENOUS | Status: AC
Start: 2015-05-22 — End: 2015-05-23
  Filled 2015-05-22: qty 10

## 2015-05-22 MED ORDER — METOCLOPRAMIDE HCL 5 MG/ML IJ SOLN
10.0000 mg | INTRAMUSCULAR | Status: AC
  Filled 2015-05-22: qty 2

## 2015-05-22 MED ORDER — LIDOCAINE VISCOUS 2 % MT SOLN
OROMUCOSAL | Status: AC
  Filled 2015-05-22: qty 15

## 2015-05-22 MED ORDER — PANTOPRAZOLE SODIUM 40 MG IV SOLR
INTRAVENOUS | Status: AC
  Filled 2015-05-22: qty 80

## 2015-05-22 MED ORDER — SODIUM CHLORIDE 0.9 % IV SOLN
8.0000 mg/h | INTRAVENOUS | Status: AC
  Administered 2015-05-22 – 2015-05-25 (×7): 8 mg/h via INTRAVENOUS
  Filled 2015-05-22 (×7): qty 80

## 2015-05-22 MED ORDER — SODIUM CHLORIDE 0.9 % IV SOLN
INTRAVENOUS | Status: DC
  Administered 2015-05-22: 22:00:00 via INTRAVENOUS

## 2015-05-22 MED ORDER — PANTOPRAZOLE SODIUM 40 MG IV SOLR: 40.0000 mg | Freq: Two times a day (BID) | INTRAVENOUS | Status: DC

## 2015-05-22 MED ORDER — SODIUM CHLORIDE 0.9 % IV SOLN
Freq: Once | INTRAVENOUS | Status: AC
  Administered 2015-05-22: 22:00:00 via INTRAVENOUS

## 2015-05-22 MED ORDER — SODIUM CHLORIDE 0.9 % IV SOLN
INTRAVENOUS | Status: DC
  Administered 2015-05-22: 14:00:00 via INTRAVENOUS

## 2015-05-22 MED ORDER — STERILE WATER FOR IRRIGATION IR SOLN
Status: DC | PRN
  Administered 2015-05-22: 16:00:00

## 2015-05-22 MED ORDER — SODIUM CHLORIDE 0.9 % IV SOLN
80.0000 mg | Freq: Once | INTRAVENOUS | Status: AC
  Administered 2015-05-22: 80 mg via INTRAVENOUS
  Filled 2015-05-22: qty 80

## 2015-05-22 MED ORDER — MEPERIDINE HCL 100 MG/ML IJ SOLN
INTRAMUSCULAR | Status: DC | PRN
  Administered 2015-05-22 (×2): 25 mg via INTRAVENOUS

## 2015-05-22 MED ORDER — MIDAZOLAM HCL 5 MG/5ML IJ SOLN
INTRAMUSCULAR | Status: AC
  Filled 2015-05-22: qty 10

## 2015-05-22 NOTE — Progress Notes (Signed)
Discussed planned EGD today with patient with his brother in the room. He verbally agrees. Attempts x 2 to contact patient's daughter Deonne Corniel at [phone] to explain risks/benefits and obtain consent for the procedure given patient's AMS during admission. Will continue to try and contact her.

## 2015-05-22 NOTE — Care Management Important Message (Signed)
Important Message  Patient Details  Name: Dustin Estes. MRN: [id number] Date of Birth: Nov 01, 1950   Medicare Important Message Given:  Yes    [name], RN 05/22/2015, 2:15 PM

## 2015-05-22 NOTE — Care Management Note (Signed)
Case Management Note  Patient Details  Name: Dustin Estes. MRN: [id number] Date of Birth: 1951/03/22  Subjective/Objective:                  Pt admitted with UGIB. Pt is from home, lives alone and has strong support from brother. Pt's daughter lives far aware and is unable to provide much support. Pt has no HH services or DME prior to admission. Pt plans to return home with self care, [facility] offered and pt has refused. Pt ordered RW for support if weakness continues. Pt has chosen [facility] for DME provider. [name] of [facility], made aware of referral and will obtain pt info from chart and deliver RW to pt's room.   Action/Plan: No further CM needs.   Expected Discharge Date:  05/25/15               Expected Discharge Plan:  Home/Self Care  In-House Referral:     Discharge planning Services  CM Consult  Post Acute Care Choice:  Durable Medical Equipment Choice offered to:  Patient  DME Arranged:  [name] rolling DME Agency:  [place]:    [place]:     Status of Service:  Completed, signed off  Medicare Important Message Given:    yes Date Medicare IM Given:    Medicare IM give by:    Date Additional Medicare IM Given:    Additional Medicare Important Message give by:     If discussed at [place] of Stay Meetings, dates discussed:    Additional Comments:  [name], RN 05/22/2015, 2:11 PM

## 2015-05-22 NOTE — H&P (Signed)
Primary Care Physician:  [name], MD Primary Gastroenterologist:  Dr. Oneida Alar  Pre-Procedure History & Physical: HPI:  Dustin Estes. is a 65 y.o. male here for [place].  Past Medical History  Diagnosis Date  . Dysrhythmia   . Diabetes mellitus   . Sleep apnea   . 2Nd degree AV block 04/29/2011    Medtronic Adapta  . PAD (peripheral artery disease) ([place])   . Abdominal aortic aneurysm (HCC)     4.1 x 4.3 cm  . Hypertension   . Obesity   . Tobacco abuse   . OSA (obstructive sleep apnea)     Past Surgical History  Procedure Laterality Date  . Permanent pacemaker insertion  04/29/2011    Medtronic Adapta  . Brain surgery    . Appendectomy  12/69  . Bladder surgery  9/04,11/05,9/07    tumor removal  . US echocardiography  04/13/2010    mild asymmetric LVH, EF >55%,mild mitral ca+,AOV mildly sclerotic  . Nm myocar perf wall motion  04/13/2010    moderate, mostly fixed  inferoseptal defect, suspicious for artifact.  . Permanent pacemaker insertion N/A 04/29/2011    Procedure: PERMANENT PACEMAKER INSERTION;  Surgeon: Sanda Klein, MD;  Location: [facility];  Service: Cardiovascular;  Laterality: N/A;  . Loop recorder explant  04/29/2011    Procedure: LOOP RECORDER EXPLANT;  Surgeon: Sanda Klein, MD;  Location: [facility];  Service: Cardiovascular;;    Prior to Admission medications   Medication Sig Start Date End Date Taking? Authorizing Provider  aspirin EC 325 MG tablet Take 325 mg by mouth at bedtime.     Yes Historical Provider, MD  diltiazem (CARDIZEM CD) 360 MG 24 hr capsule Take 360 mg by mouth every morning.     Yes Historical Provider, MD  fenofibrate 160 MG tablet Take 160 mg by mouth at bedtime.     Yes Historical Provider, MD  Flaxseed, Linseed, 1000 MG CAPS Take 1 capsule by mouth 2 (two) times daily.    Yes Historical Provider, MD  glipiZIDE (GLUCOTROL) 10 MG tablet Take 10 mg by mouth every morning.     Yes Historical Provider, MD  metFORMIN  (GLUCOPHAGE) 1000 MG tablet Take 1,000 mg by mouth 2 (two) times daily with a meal.  02/25/14  Yes Historical Provider, MD  metoprolol tartrate (LOPRESSOR) 25 MG tablet Take 25 mg by mouth 2 (two) times daily.   Yes Historical Provider, MD  Multiple Vitamin (MULITIVITAMIN WITH MINERALS) TABS Take 1 tablet by mouth at bedtime. Centrum Silver   Yes Historical Provider, MD  niacin 500 MG tablet Take 500 mg by mouth 2 (two) times daily with a meal.     Yes Historical Provider, MD  quinapril (ACCUPRIL) 20 MG tablet TAKE ONE TABLET BY MOUTH DAILY 12/16/13  Yes Pixie Casino, MD  simvastatin (ZOCOR) 40 MG tablet Take 40 mg by mouth at bedtime.    Yes Historical Provider, MD    Allergies as of 05/21/2015  . (No Known Allergies)    Family History  Problem Relation Age of Onset  . Diabetes Mother   . Diabetes Father   . Stroke Father     Social History   Social History  . Marital Status: Divorced    Spouse Name: N/A  . Number of Children: N/A  . Years of Education: N/A   Occupational History  . Not on file.   Social History Main Topics  . Smoking status: Current Every Day Smoker --  2.00 packs/day for 47 years    Types: Cigarettes  . Smokeless tobacco: Never Used     Comment: 4 (10packs per caroton) cartons/month (01/13/14)  . Alcohol Use: Yes     Comment: "hardly ever"  . Drug Use: No  . Sexual Activity: No   Other Topics Concern  . Not on file   Social History Narrative    Review of Systems: See HPI, otherwise negative ROS   Physical Exam: BP 94/62 mmHg  Pulse 81  Temp(Src) 98.6 F (37 C) (Oral)  Resp 18  Ht 5\' 9"  (1.753 m)  Wt 179 lb 7.3 oz (81.4 kg)  BMI 26.49 kg/m2  SpO2 92% General:   Alert,  pleasant and cooperative in NAD Head:  Normocephalic and atraumatic. Neck:  Supple; Lungs:  Clear throughout to auscultation.    Heart:  Regular rate and rhythm. Abdomen:  Soft, nontender and nondistended. Normal bowel sounds, without guarding, and without rebound.    Neurologic:  Alert and  oriented x4;  grossly normal neurologically.  Impression/Plan:   MELENA  PLAN: 1. EGD TODAY

## 2015-05-22 NOTE — Consult Note (Signed)
Referring Provider: No ref. provider found Primary Care Physician:  [name], MD Primary Gastroenterologist:  Dr. Oneida Alar  Date of Admission: 05/21/15 Date of Consultation: 05/22/15  Reason for Consultation:  GI Bleed  HPI:  65 year old male presented to the emergency department with altered mental status and history provided by family, EMS, an ED physician. Patient was at home with family when he became suddenly weak, slumped over, refused to respond to family. Patient refused emergency room evaluation but daughter had matched straight order obtained authorizing forcing him to seek care. Family reported melenic stools at home over the last several days without any other reported symptoms. Unknown colonoscopy status. He was subsequently admitted for acute encephalopathy without known etiology and GI bleed. Suspected upper GI bleed, 2 units PRBCs ordered and given in the emergency department, protonic strip started, clear liquids and nothing by mouth since midnight last night. hemaglobin on admission was 7.6 and after 2 units had an appropriate rise to 9.8 this morning. Hypertension with response to IV fluid resuscitation. It was discovered he had been on 325 mg aspirin daily.  Today he seems much more oriented compared to admission description. States his last bowel movement was early this morning but did not look to see if it was dark. Per nursing staff, last bowel movement was last night 3rd shift and still dark. No bowel movement in the past 3 hours at least. Denies abdominal pain, N/V, chest pain, dyspnea, dizziness, lightheadedness, syncope, near syncope. Denies hematemesis or hematochezia. Denies GERD or dyspepsia symptoms. Denies NSAID use, only OTC meds he takes are MVI and fish oil. Denies any other upper or lower GI complaints. He is insisting that he is O- blood and was given O+ blood; Type and cross shows O+ blood and he received 2 units of this.  Last colonoscopy over 10 years ago per  patient; Denies ever having EGD.  Past Medical History  Diagnosis Date  . Dysrhythmia   . Diabetes mellitus   . Sleep apnea   . 2Nd degree AV block 04/29/2011    Medtronic Adapta  . PAD (peripheral artery disease) ([place])   . Abdominal aortic aneurysm (HCC)     4.1 x 4.3 cm  . Hypertension   . Obesity   . Tobacco abuse   . OSA (obstructive sleep apnea)     Past Surgical History  Procedure Laterality Date  . Permanent pacemaker insertion  04/29/2011    Medtronic Adapta  . Brain surgery    . Appendectomy  12/69  . Bladder surgery  9/04,11/05,9/07    tumor removal  . US echocardiography  04/13/2010    mild asymmetric LVH, EF >55%,mild mitral ca+,AOV mildly sclerotic  . Nm myocar perf wall motion  04/13/2010    moderate, mostly fixed  inferoseptal defect, suspicious for artifact.  . Permanent pacemaker insertion N/A 04/29/2011    Procedure: PERMANENT PACEMAKER INSERTION;  Surgeon: Sanda Klein, MD;  Location: [facility];  Service: Cardiovascular;  Laterality: N/A;  . Loop recorder explant  04/29/2011    Procedure: LOOP RECORDER EXPLANT;  Surgeon: Sanda Klein, MD;  Location: [facility];  Service: Cardiovascular;;    Prior to Admission medications   Medication Sig Start Date End Date Taking? Authorizing Provider  aspirin EC 325 MG tablet Take 325 mg by mouth at bedtime.     Yes Historical Provider, MD  diltiazem (CARDIZEM CD) 360 MG 24 hr capsule Take 360 mg by mouth every morning.     Yes Historical Provider,  MD  fenofibrate 160 MG tablet Take 160 mg by mouth at bedtime.     Yes Historical Provider, MD  Flaxseed, Linseed, 1000 MG CAPS Take 1 capsule by mouth 2 (two) times daily.    Yes Historical Provider, MD  glipiZIDE (GLUCOTROL) 10 MG tablet Take 10 mg by mouth every morning.     Yes Historical Provider, MD  metFORMIN (GLUCOPHAGE) 1000 MG tablet Take 1,000 mg by mouth 2 (two) times daily with a meal.  02/25/14  Yes Historical Provider, MD  metoprolol tartrate (LOPRESSOR) 25  MG tablet Take 25 mg by mouth 2 (two) times daily.   Yes Historical Provider, MD  Multiple Vitamin (MULITIVITAMIN WITH MINERALS) TABS Take 1 tablet by mouth at bedtime. Centrum Silver   Yes Historical Provider, MD  niacin 500 MG tablet Take 500 mg by mouth 2 (two) times daily with a meal.     Yes Historical Provider, MD  quinapril (ACCUPRIL) 20 MG tablet TAKE ONE TABLET BY MOUTH DAILY 12/16/13  Yes Pixie Casino, MD  simvastatin (ZOCOR) 40 MG tablet Take 40 mg by mouth at bedtime.    Yes Historical Provider, MD    Current Facility-Administered Medications  Medication Dose Route Frequency Provider Last Rate Last Dose  . 0.9 %  sodium chloride infusion   Intravenous STAT Noemi Chapel, MD      . acetaminophen (TYLENOL) tablet 650 mg  650 mg Oral Q6H PRN Waldemar Dickens, MD       Or  . acetaminophen (TYLENOL) suppository 650 mg  650 mg Rectal Q6H PRN Waldemar Dickens, MD      . insulin aspart (novoLOG) injection 0-5 Units  0-5 Units Subcutaneous QHS Waldemar Dickens, MD   0 Units at 05/21/15 2315  . insulin aspart (novoLOG) injection 0-9 Units  0-9 Units Subcutaneous TID WC Waldemar Dickens, MD   1 Units at 05/22/15 0818  . ondansetron (ZOFRAN) injection 4 mg  4 mg Intravenous Q8H PRN Noemi Chapel, MD      . ondansetron [facility]) tablet 4 mg  4 mg Oral Q6H PRN Waldemar Dickens, MD       Or  . ondansetron [facility]) injection 4 mg  4 mg Intravenous Q6H PRN Waldemar Dickens, MD      . pantoprazole (PROTONIX) 80 mg in sodium chloride 0.9 % 100 mL IVPB  80 mg Intravenous Once Kelvin Cellar, MD      . pantoprazole (PROTONIX) 80 mg in sodium chloride 0.9 % 250 mL (0.32 mg/mL) infusion  8 mg/hr Intravenous Continuous Kelvin Cellar, MD      . Derrill Memo ON 05/25/2015] pantoprazole (PROTONIX) injection 40 mg  40 mg Intravenous Q12H Kelvin Cellar, MD      . pneumococcal 23 valent vaccine (PNU-IMMUNE) injection 0.5 mL  0.5 mL Intramuscular Tomorrow-1000 Waldemar Dickens, MD      . simvastatin (ZOCOR) tablet 40 mg   40 mg Oral QHS Waldemar Dickens, MD   40 mg at 05/21/15 2315  . sodium chloride flush (NS) 0.9 % injection 3 mL  3 mL Intravenous Q12H Waldemar Dickens, MD   3 mL at 05/22/15 [id number]    Allergies as of 05/21/2015  . (No Known Allergies)    Family History  Problem Relation Age of Onset  . Diabetes Mother   . Diabetes Father   . Stroke Father     Social History   Social History  . Marital Status: Divorced    Spouse Name: N/A  .  Number of Children: N/A  . Years of Education: N/A   Occupational History  . Not on file.   Social History Main Topics  . Smoking status: Current Every Day Smoker -- 2.00 packs/day for 47 years    Types: Cigarettes  . Smokeless tobacco: Never Used     Comment: 4 (10packs per caroton) cartons/month (01/13/14)  . Alcohol Use: Yes     Comment: "hardly ever"  . Drug Use: No  . Sexual Activity: No   Other Topics Concern  . Not on file   Social History Narrative    Review of Systems: 10-point ROS negative except as per HPI.  Physical Exam: Vital signs in last 24 hours: Temp:  [98 F (36.7 C)-99.3 F (37.4 C)] 98.3 F (36.8 C) (02/03 0506) Pulse Rate:  [81-99] 81 (02/03 0636) Resp:  [16-24] 18 (02/03 0506) BP: (82-121)/(41-78) 88/50 mmHg (02/03 0644) SpO2:  [90 %-97 %] 94 % (02/03 0506) Weight:  [179 lb 7.3 oz (81.4 kg)-217 lb (98.431 kg)] 179 lb 7.3 oz (81.4 kg) (02/02 2158) Last BM Date: 05/21/15 General:   Alert,  Well-developed, well-nourished, pleasant and cooperative in NAD Head:  Normocephalic and atraumatic. Eyes:  Sclera clear, no icterus. Conjunctiva pink. Ears:  Normal auditory acuity. Neck:  Supple; no masses or thyromegaly. Lungs:  Clear throughout to auscultation.   No wheezes, crackles, or rhonchi. No acute distress. Heart:  Regular rate and rhythm; no murmurs, clicks, rubs,  or gallops. Abdomen:  Soft, nontender and nondistended. No masses, hepatosplenomegaly or hernias noted. Normal bowel sounds, without guarding, and without  rebound.   Rectal:  Deferred.   Msk:  Symmetrical without gross deformities. Pulses:  Normal pulses noted. Extremities:  Without clubbing or edema. Neurologic:  Alert and  oriented x4;  grossly normal neurologically. Skin:  Intact without significant lesions or rashes. Psych:  Alert and cooperative. Normal mood and affect.  Intake/Output from previous day: 02/02 0701 - 02/03 0700 In: 2140 [I.V.:1500; Blood:640] Out: 500 [Urine:500] Intake/Output this shift: Total I/O In: 3 [I.V.:3] Out: -   Lab Results:  Recent Labs  05/21/15 1838 05/22/15 0602  WBC 11.6* 12.3*  HGB 7.6* 9.8*  HCT 22.5* 29.0*  PLT 223 211   BMET  Recent Labs  05/21/15 1838 05/22/15 0602  NA 135 136  K 3.8 3.8  CL 99* 101  CO2 28 30  GLUCOSE 196* 156*  BUN 62* 39*  CREATININE 1.21 1.11  CALCIUM 7.8* 7.9*   LFT  Recent Labs  05/21/15 1838 05/22/15 0602  PROT 5.4* 5.7*  ALBUMIN 2.5* 2.7*  AST 62* 39  ALT 45 39  ALKPHOS 68 50  BILITOT 0.4 0.9   PT/INR  Recent Labs  05/22/15 0602  LABPROT 16.0*  INR 1.27   Hepatitis Panel No results for input(s): HEPBSAG, HCVAB, HEPAIGM, HEPBIGM in the last 72 hours. C-Diff No results for input(s): CDIFFTOX in the last 72 hours.  Studies/Results: Ct Head Wo Contrast  05/21/2015  CLINICAL DATA:  EMS reports pt drank 9 raw eggs this morning for unknown reason. Pt's family reported to EMS that pt is usually able to ambulate but hasn't been able to lately and fell to the floor today and was unable to get up. Pt's family also reported to EMS that pt hasn't ate or drank for several weeks. EXAM: CT HEAD WITHOUT CONTRAST TECHNIQUE: Contiguous axial images were obtained from the base of the skull through the vertex without intravenous contrast. COMPARISON:  03/24/2010 FINDINGS: Ventricles are normal  configuration. There is ventricular and sulcal enlargement reflecting mild atrophy. No hydrocephalus. There are no parenchymal masses or mass effect.  Encephalomalacia along the lateral left frontal lobe is from a previous hemorrhage. There is an overlying craniotomy flap. There is hypoattenuation consistent with additional encephalomalacia along the anterior, inferior frontal lobes bilaterally, likely from old trauma. There is no evidence of a recent cortical infarct. There are no extra-axial masses or abnormal fluid collections. There is no intracranial hemorrhage. Visualized sinuses and mastoid air cells are clear.  The IMPRESSION: 1. No acute intracranial abnormalities. 2. Status post left sided craniotomy. There is a focal area of left lateral frontal lobe encephalomalacia from previous hemorrhage and subsequent surgery. 3. Anterior inferior frontal lobe encephalomalacia likely from remote trauma. 4. Mild atrophy. Electronically Signed   By: Lajean Manes M.D.   On: 05/21/2015 19:01   Dg Chest Port 1 View  05/21/2015  CLINICAL DATA:  Altered mental status.  Fell today. EXAM: PORTABLE CHEST 1 VIEW COMPARISON:  04/30/2011 FINDINGS: Dual lead left-sided pacemaker in place. Leads project over the right atrium and ventricle. The cardiomediastinal contours are normal. Pulmonary vasculature is normal. No consolidation, pleural effusion, or pneumothorax. No acute osseous abnormalities are seen. IMPRESSION: No acute pulmonary process. Electronically Signed   By: Jeb Levering M.D.   On: 05/21/2015 19:42    Impression: Patient with 2-3 days history of melena stools with no known history to explain etiology. Generally asymptomatic from a GI standpoint. Per ED provider and admitting hospitalist, patient was very confused on presentation. Per family, he became very weak and with AMS at home. Acute anemia on presentation with hgb of 7.6. He was given 2 units f PRBC and had appropriate increase to 9.8 this morning. Elevated BUN, normal Cr. AST/ALT normal, INR and platelets normal. Not on any anticoagulation. His blood pressure is a bit soft with SBP in the upper  80s this morning, was 90s to low 100s last night. Heart rate is stable 80s-90s.   Likely upper GI bleed given melena stools, differentials include gastritis, esophagitis, PUD although other more insideous pathology cannot be excluded. His mental status seems much improved, appropriate rise in Hgb after transfusion. Seems stable at this time with the exception of soft blood pressures.  Plan: 1. Continue PPI gtt 2. Recheck hgb at noon; Continue to monitor closely 3. Transfuse as necessary. 4. EGD today 5. Monitor and notify of any further GI bleeding 6. Other supportive measures   Walden Field, AGNP-C Adult & Gerontological Nurse Practitioner [facility]     LOS: 1 day     05/22/2015, 8:46 AM    Addendum: Roselyn Bering with Rolanda Lundborg, patient's daughter at [phone]. Explanation of EGD given. The risks, benefits, and alternatives have also been discussed with the her. She agrees to the procedure. Will have nursing staff complete phone/verbal consent per process as she is unable to come in to sign in person at this time.

## 2015-05-22 NOTE — Op Note (Signed)
[facility] [address] [place], [address]   ENDOSCOPY PROCEDURE REPORT  PATIENT: Dustin Estes, Dustin Estes  MR#: [id number] BIRTHDATE: 1950-06-21 , 72  yrs. old GENDER: male  ENDOSCOPIST: Danie Binder, MD REFERRED [id number] Tollie Pizza, M.D. PROCEDURE DATE: 28-May-2015 PROCEDURE:   EGD w/ biopsy , EGD w/ control of bleeding , and EGD w/ directed submucosal injection: EPINEPHRINE  INDICATIONS:melena.  MEDICATIONS: Demerol 50 mg IV and Versed 4 mg IV  MD INITIATED SEDATION: 1546 PROCEDURE COMPLETE: 1653    TOPICAL ANESTHETIC:   Viscous Xylocaine ASA CLASS:  DESCRIPTION OF PROCEDURE:     Physical exam was performed.  Informed consent was obtained from the patient after explaining the benefits, risks, and alternatives to the procedure.  The patient was connected to the monitor and placed in the left lateral position.  Continuous oxygen was provided by nasal cannula and IV medicine administered through an indwelling cannula.  After administration of sedation, the patients esophagus was intubated and the [id number]  endoscope was advanced under direct visualization to the second portion of the duodenum.  The scope was removed slowly by carefully examining the color, texture, anatomy, and integrity of the mucosa on the way out.  The patient was recovered in endoscopy and discharged home in satisfactory condition.  Estimated blood loss is zero unless otherwise noted in this procedure report.    ESOPHAGUS: CIRCUMFERENTIAL EXUDATE AND ERYTHEMA BEGINNING 27 CM FROM THE TEETH and extending to THE EG JUNCTION.   STOMACH: A single non-bleeding, clean-based and punctate ulcer ranging between 3-73mm in size with surrounding edema was found in the gastric antrum. Severe and moderate erosive gastritis (inflammation) was found in the gastric antrum.  Multiple biopsies were performed using cold forceps.  DUODENUM: A large non-bleeding and deep ulcer with a red spot, heaped up edges  and surrounding edema was found in the duodenal bulb.  Submucosal injection of 3 ml of epinephrine 1:10,000 was performed around the bleeding site.  Bipolar (BICAP) cautery with a 7Fr probe was applied to the site for 10 secs using 20 watts power.  Firm pressure was applied to the cautery site with active arterial spurting of blood,  5 CC EPI INJECTED. ONE CLIP FAILED TO DEPLOY. BICAP APPLIED AT 25 W. Complete hemostasis was achieved. TWO CLIPS PLACED PROXIMAL AND DISTAL TO CAUTERIZED AREA. The duodenal mucosa showed no abnormalities in the 2nd part of the duodenum.         COMPLICATIONS: There were no immediate complications.  ENDOSCOPIC IMPRESSION: 1.   UGI BLEED DUE TO LARGE DUODENAL ULCER 2.   Single ulcer  in the gastric antrum 3.   Erosive gastritis (inflammation) was found in the gastric antrum; multiple biopsies were performed  RECOMMENDATIONS: PROTONIX DRIP FOR 72 HRS NO MRI FOR 30 DAYS NPO EXCPET SIPS WITH MEDS TRANSFER TO [facility] STEP DOWN.  IF HE REBLEEDS, PT NEEDS IR ANGIOGRAPHY/EMBOLIZATION OR SURGERY [id number]: DISCUSSED WITH DRs.  CHIU, ZAMORA, YACOUB, AND MANN.  PLAN OF CARE DISCUSSED WITH DAUGHTER [name].  REPEAT EXAM:   _______________________________ eSignedDanie Binder, MD May 28, 2015 6:25 PM    CPT CODES: ICD CODES:  The ICD and CPT codes recommended by this software are interpretations from the data that the clinical staff has captured with the software.  The verification of the translation of this report to the ICD and CPT codes and modifiers is the sole responsibility of the health care institution and practicing physician where this report was generated.  [employer]  [place] will not be held responsible for the validity of the ICD and CPT codes included on this report.  AMA assumes no liability for data contained or not contained herein. CPT is a Designer, television/film set of the [employer].   PATIENT NAME:   Steen, Kick MR#: [id number]

## 2015-05-22 NOTE — Progress Notes (Addendum)
Pt had two LARGE BLOODY BMs. SBP TRANSIENTLY IN THE 80s. DISCUSSED WITH DR. Nelda Marseille. AGREES WITH AGGRESSIVE FLUID RESUSCITATION. NS @250  CC ORDERED. SBP INCREASED TO > 100. PT ALERT AND INTERACTIVE. NO BLOODY BM IN BED AFTER PT CLEANED UP. AWAITING CARELINK.

## 2015-05-22 NOTE — Progress Notes (Signed)
[facility] hospitalist progress note. Chief complaint. Transfer note. History of present illness. This 65 year old male admitted to [facility] with probable upper GI bleed. Apparently gastroenterology and they're performed an upper endoscopy noting a large duodenal ulcer. This was complicated by active bleeding. I do not have a formal endoscopy report to review this time. Patient was recommended for transfer to [facility] due to ongoing bleeding. He is being transfused packed red blood cells now on his third unit. During transport his blood pressures dropped into the [id number] systolic bed transport team increased packed red blood cell transfusion rate as well as bolus the patient with improvement in systolic blood pressure do the 120s. I find the patient alert and in no distress per bedside evaluation. He has no medical complaints at this time. Physical exam. Vital signs. Temperature 96.3, pulse 109, respiration 23, blood pressure 81/63. O2 sats 100%. General appearance. Well-developed elderly male who is alert and in no distress. Cardiac. Rate and rhythm regular. Lungs. Breath sounds clear and equal. Abdomen. Soft with positive bowel sounds. No pain with palpation. Impression/plan. Problem #1. Upper GI bleed. We'll obtain a CBC 1 hour posttransfusion of current unit packed red blood cells. We'll transfuse further if indicated. Patient continues to have rectal bleeding at this time. Patient should decompensate and will contact gastroenterology urgently. Problem #2. Acute kidney injury. Presenting creatinine 1.2. Continue to hydrate and will recheck a metabolic panel in a.m. Problem #3. Hypertension. Hypotensive given acute GI bleed. All prior antihypertensive agents discontinued. Problem #4. Diabetes. Nothing by mouth due to ongoing bleed. Follow with sliding-scale coverage. Patient appears clinically stable post transfer. All orders appear to have transferred here appropriately.

## 2015-05-22 NOTE — Progress Notes (Signed)
Follow Up Note  Dustin Estes is a 65 year old gentleman who was admitted overnight presented with upper GI bleed. He was seen and evaluated by GI, undergoing upper endoscopy today. I was notified by Dr Oneida Alar that he had a large duodenal ulcer. Procedure was complicated by active bleed that was controlled. She recommended that Dustin Estes be transferred to [facility] for close monitoring. She stated speaking with Dr Nelda Marseille of pulmonary critical care medicine who recommended he be transferred to the stepdown unit. Plan to check H&H's overnight.

## 2015-05-22 NOTE — Progress Notes (Signed)
  Echocardiogram 2D Echocardiogram has been performed.  Jennette Dubin 05/22/2015, 11:58 AM

## 2015-05-22 NOTE — Progress Notes (Signed)
[facility] PROGRESS NOTE  Dustin Estes. HC:[id number] DOB: 1950/10/23 DOA: 05/21/2015 PCP: [name], MD  Assessment/Plan: 1. Probable upper GI bleed -Dustin Estes having malonic stools over the past several days becoming minimally responsive yesterday which prompted referral to the emergency department. On presentation he had a hemoglobin of 7.6. He was transfused with 2 units of packed red blood cells overnight with his hemoglobin increasing to 9.8 on a.m. lab work. -Lab work showing elevated BUN at 62 which would be suggestive of an upper GI bleed -Hypotension responded to IV fluid resuscitation. -His home medication showing he had been on aspirin 325 mg by mouth daily. This has been discontinued. -Will start IV Protonix with bolus and infusion.  -GI was consulted for endoscopy   2.  Acute kidney injury -Presenting laboratory creatinine 1.2 with BUN of 62. As mentioned above think an elevated BUN would be suggestive of upper GI bleed. -Patient is likely to be hypovolemic as well having hypotension overnight. He responded to IV fluid resuscitation.  -ACE discontinued on admission -AM labs showing improvement to renal function  3.  History of hypertension. -He became hypotensive overnight for which his antihypertensive agents have been discontinued  4.  Type 2 diabetes mellitus -On metformin and glipizide at home. -He is currently nothing by mouth for possible upper endoscopy for which oral hypoglycemics were discontinued. Continue sliding scale coverage  5.  Hypotension -Likely secondary to hypovolemia -Responded to IV fluids. S/p transfusion with 2 units of PRBC's overnight.   Code Status: Full Code Family Communication:  Disposition Plan: GI consulted, possible endoscopy.    Consultants:  GI   HPI/Subjective: Dustin Estes [place] 65-year-old gentleman with a history of hypertension, peripheral artery disease, diabetes mellitus, admitted to medicine  service on 05/21/2015, found by family members to be weak and slumped over becoming minimally responsive. Family members had also noticed melenic stools over the last several days. Initial lab work revealed a hemoglobin of 7.6. Suspect upper GI bleed as he was started on IV Protonix and transfused with 2 units of packed red blood cells overnight. Overnight patient becoming hypotensive which responded to bolus with normal saline. GI has been consulted.  Objective: Filed Vitals:   05/22/15 0636 05/22/15 0644  BP: 82/41 88/50  Pulse: 81   Temp:    Resp:      Intake/Output Summary (Last 24 hours) at 05/22/15 0829 Last data filed at 05/22/15 [id number]  Gross per 24 hour  Intake   2143 ml  Output    500 ml  Net   1643 ml   Filed Weights   05/21/15 1710 05/21/15 1717 05/21/15 2158  Weight: 98.431 kg (217 lb) 90.719 kg (200 lb) 81.4 kg (179 lb 7.3 oz)    Exam:   General:  Pale appearing in no acute distress, awake and alert. Seems mildly confused  Cardiovascular: RRR, NL S1S2, no extremity edema  Respiratory: Normal inspiratory effort, no wheezing rhonchi or rales.   Abdomen: Soft, NT ND  Musculoskeletal: No edema  Data Reviewed: Basic Metabolic Panel:  Recent Labs Lab 05/21/15 1838 05/22/15 0602  NA 135 136  K 3.8 3.8  CL 99* 101  CO2 28 30  GLUCOSE 196* 156*  BUN 62* 39*  CREATININE 1.21 1.11  CALCIUM 7.8* 7.9*   Liver Function Tests:  Recent Labs Lab 05/21/15 1838 05/22/15 0602  AST 62* 39  ALT 45 39  ALKPHOS 68 50  BILITOT 0.4 0.9  PROT 5.4* 5.7*  ALBUMIN 2.5* 2.7*    Recent Labs Lab 05/21/15 1838  LIPASE 59*    Recent Labs Lab 05/21/15 1838  AMMONIA 20   CBC:  Recent Labs Lab 05/21/15 1838 05/22/15 0602  WBC 11.6* 12.3*  NEUTROABS 8.4*  --   HGB 7.6* 9.8*  HCT 22.5* 29.0*  MCV 96.6 94.2  PLT 223 211   Cardiac Enzymes:  Recent Labs Lab 05/21/15 1838  TROPONINI <0.03   BNP (last 3 results) No results for input(s): BNP in the last  8760 hours.  ProBNP (last 3 results) No results for input(s): PROBNP in the last 8760 hours.  CBG:  Recent Labs Lab 05/22/15 0711  GLUCAP 147*    Recent Results (from the past 240 hour(s))  Culture, blood (routine x 2)     Status: None (Preliminary result)   Collection Time: 05/21/15 11:15 PM  Result Value Ref Range Status   Specimen Description BLOOD LEFT ARM  Final   Special Requests BOTTLES DRAWN AEROBIC AND ANAEROBIC 6CC EACH  Final   Culture NO GROWTH < 12 HOURS  Final   Report Status PENDING  Incomplete  Culture, blood (routine x 2)     Status: None (Preliminary result)   Collection Time: 05/21/15 11:18 PM  Result Value Ref Range Status   Specimen Description BLOOD RIGHT ANTECUBITAL  Final   Special Requests BOTTLES DRAWN AEROBIC AND ANAEROBIC RAC  Final   Culture NO GROWTH < 12 HOURS  Final   Report Status PENDING  Incomplete     Studies: Ct Head Wo Contrast  05/21/2015  CLINICAL DATA:  EMS reports pt drank 9 raw eggs this morning for unknown reason. Pt's family reported to EMS that pt is usually able to ambulate but hasn't been able to lately and fell to the floor today and was unable to get up. Pt's family also reported to EMS that pt hasn't ate or drank for several weeks. EXAM: CT HEAD WITHOUT CONTRAST TECHNIQUE: Contiguous axial images were obtained from the base of the skull through the vertex without intravenous contrast. COMPARISON:  03/24/2010 FINDINGS: Ventricles are normal configuration. There is ventricular and sulcal enlargement reflecting mild atrophy. No hydrocephalus. There are no parenchymal masses or mass effect. Encephalomalacia along the lateral left frontal lobe is from a previous hemorrhage. There is an overlying craniotomy flap. There is hypoattenuation consistent with additional encephalomalacia along the anterior, inferior frontal lobes bilaterally, likely from old trauma. There is no evidence of a recent cortical infarct. There are no extra-axial masses  or abnormal fluid collections. There is no intracranial hemorrhage. Visualized sinuses and mastoid air cells are clear.  The IMPRESSION: 1. No acute intracranial abnormalities. 2. Status post left sided craniotomy. There is a focal area of left lateral frontal lobe encephalomalacia from previous hemorrhage and subsequent surgery. 3. Anterior inferior frontal lobe encephalomalacia likely from remote trauma. 4. Mild atrophy. Electronically Signed   By: Lajean Manes M.D.   On: 05/21/2015 19:01   Dg Chest Port 1 View  05/21/2015  CLINICAL DATA:  Altered mental status.  Fell today. EXAM: PORTABLE CHEST 1 VIEW COMPARISON:  04/30/2011 FINDINGS: Dual lead left-sided pacemaker in place. Leads project over the right atrium and ventricle. The cardiomediastinal contours are normal. Pulmonary vasculature is normal. No consolidation, pleural effusion, or pneumothorax. No acute osseous abnormalities are seen. IMPRESSION: No acute pulmonary process. Electronically Signed   By: Jeb Levering M.D.   On: 05/21/2015 19:42    Scheduled Meds: . sodium chloride   Intravenous  STAT  . aspirin EC  325 mg Oral QHS  . diltiazem  360 mg Oral BH-[id number]  . insulin aspart  0-5 Units Subcutaneous QHS  . insulin aspart  0-9 Units Subcutaneous TID WC  . pneumococcal 23 valent vaccine  0.5 mL Intramuscular Tomorrow-1000  . simvastatin  40 mg Oral QHS  . sodium chloride flush  3 mL Intravenous Q12H   Continuous Infusions: . sodium chloride 75 mL/hr at 05/22/15 0249    Active Problems:   HTN (hypertension)   DM2 (diabetes mellitus, type 2) ([place])   Dyslipidemia   Pacemaker   Second degree AV block, Mobitz type II   Upper GI bleed   Near syncope   Acute encephalopathy   Severe anemia    Time spent: 35 min    Kelvin Cellar  [facility] Pager [phone]. If 7PM-7AM, please contact night-coverage at [email or web address], password [facility] 05/22/2015, 8:29 AM  LOS: 1 day

## 2015-05-23 LAB — CBC
HCT: 25.9 % — ABNORMAL LOW (ref 39.0–52.0)
HCT: 26.8 % — ABNORMAL LOW (ref 39.0–52.0)
HEMATOCRIT: 22.4 % — AB (ref 39.0–52.0)
Hemoglobin: 8.2 g/dL — ABNORMAL LOW (ref 13.0–17.0)
Hemoglobin: 9 g/dL — ABNORMAL LOW (ref 13.0–17.0)
Hemoglobin: 9.3 g/dL — ABNORMAL LOW (ref 13.0–17.0)
MCH: 33.2 pg (ref 26.0–34.0)
MCH: 31.4 pg (ref 26.0–34.0)
MCH: 31.9 pg (ref 26.0–34.0)
MCHC: 34.7 g/dL (ref 30.0–36.0)
MCHC: 34.7 g/dL (ref 30.0–36.0)
MCHC: 36.6 g/dL — AB (ref 30.0–36.0)
MCV: 90.5 fL (ref 78.0–100.0)
MCV: 90.7 fL (ref 78.0–100.0)
MCV: 91.8 fL (ref 78.0–100.0)
PLATELETS: 111 10*3/uL — AB (ref 150–400)
PLATELETS: 159 10*3/uL (ref 150–400)
Platelets: 130 10*3/uL — ABNORMAL LOW (ref 150–400)
RBC: 2.47 MIL/uL — ABNORMAL LOW (ref 4.22–5.81)
RBC: 2.96 MIL/uL — AB (ref 4.22–5.81)
RBC: 2.82 MIL/uL — AB (ref 4.22–5.81)
RDW: 14.2 % (ref 11.5–15.5)
RDW: 14.4 % (ref 11.5–15.5)
RDW: 14.4 % (ref 11.5–15.5)
WBC: 10.5 10*3/uL (ref 4.0–10.5)
WBC: 13.6 10*3/uL — ABNORMAL HIGH (ref 4.0–10.5)
WBC: 17 10*3/uL — ABNORMAL HIGH (ref 4.0–10.5)

## 2015-05-23 LAB — PREPARE RBC (CROSSMATCH)

## 2015-05-23 LAB — BASIC METABOLIC PANEL
Anion gap: 8 (ref 5–15)
BUN: 23 mg/dL — ABNORMAL HIGH (ref 6–20)
CALCIUM: 6.8 mg/dL — AB (ref 8.9–10.3)
CO2: 23 mmol/L (ref 22–32)
CREATININE: 0.89 mg/dL (ref 0.61–1.24)
Chloride: 109 mmol/L (ref 101–111)
GFR calc non Af Amer: >60 mL/min (ref >60)
GLUCOSE: 173 mg/dL — AB (ref 65–99)
Potassium: 4.1 mmol/L (ref 3.5–5.1)
Sodium: 140 mmol/L (ref 135–145)

## 2015-05-23 LAB — GLUCOSE, CAPILLARY
GLUCOSE-CAPILLARY: 145 mg/dL — AB (ref 65–99)
GLUCOSE-CAPILLARY: 132 mg/dL — AB (ref 65–99)
GLUCOSE-CAPILLARY: 129 mg/dL — AB (ref 65–99)
Glucose-Capillary: 140 mg/dL — ABNORMAL HIGH (ref 65–99)

## 2015-05-23 LAB — CBC WITH DIFFERENTIAL/PLATELET
BASOS ABS: 0 10*3/uL (ref 0.0–0.1)
BASOS PCT: 0 %
Eosinophils Absolute: 0.1 10*3/uL (ref 0.0–0.7)
Eosinophils Relative: 1 %
HEMATOCRIT: 24.9 % — AB (ref 39.0–52.0)
Hemoglobin: 8.7 g/dL — ABNORMAL LOW (ref 13.0–17.0)
LYMPHS ABS: 1.9 10*3/uL (ref 0.7–4.0)
Lymphocytes Relative: 18 %
MCH: 31.5 pg (ref 26.0–34.0)
MCHC: 34.9 g/dL (ref 30.0–36.0)
MCV: 90.2 fL (ref 78.0–100.0)
MONOS PCT: 9 %
Monocytes Absolute: 0.9 10*3/uL (ref 0.1–1.0)
NEUTROS ABS: 7.5 10*3/uL (ref 1.7–7.7)
Neutrophils Relative %: 72 %
Platelets: 106 10*3/uL — ABNORMAL LOW (ref 150–400)
RBC: 2.76 MIL/uL — ABNORMAL LOW (ref 4.22–5.81)
RDW: 14.3 % (ref 11.5–15.5)
WBC: 10.4 10*3/uL (ref 4.0–10.5)

## 2015-05-23 LAB — LACTIC ACID, PLASMA
LACTIC ACID, VENOUS: 0.8 mmol/L (ref 0.5–2.0)
Lactic Acid, Venous: 0.8 mmol/L (ref 0.5–2.0)

## 2015-05-23 LAB — MRSA PCR SCREENING: MRSA by PCR: NEGATIVE

## 2015-05-23 MED ORDER — SODIUM CHLORIDE 0.9 % IV BOLUS (SEPSIS)
500.0000 mL | Freq: Once | INTRAVENOUS | Status: AC
  Administered 2015-05-23: 500 mL via INTRAVENOUS

## 2015-05-23 MED ORDER — CETYLPYRIDINIUM CHLORIDE 0.05 % MT LIQD: 7.0000 mL | Freq: Two times a day (BID) | OROMUCOSAL | Status: DC

## 2015-05-23 MED ORDER — NICOTINE 21 MG/24HR TD PT24
21.0000 mg | MEDICATED_PATCH | Freq: Every day | TRANSDERMAL | Status: DC
  Administered 2015-05-24 – 2015-05-26 (×3): 21 mg via TRANSDERMAL
  Filled 2015-05-23 (×7): qty 1

## 2015-05-23 MED ORDER — CETYLPYRIDINIUM CHLORIDE 0.05 % MT LIQD
7.0000 mL | Freq: Two times a day (BID) | OROMUCOSAL | Status: DC
  Administered 2015-05-24: 7 mL via OROMUCOSAL

## 2015-05-23 MED ORDER — CHLORHEXIDINE GLUCONATE 0.12 % MT SOLN
15.0000 mL | Freq: Two times a day (BID) | OROMUCOSAL | Status: DC
  Administered 2015-05-23 – 2015-05-24 (×2): 15 mL via OROMUCOSAL

## 2015-05-23 MED ORDER — CHLORHEXIDINE GLUCONATE 0.12 % MT SOLN
15.0000 mL | Freq: Two times a day (BID) | OROMUCOSAL | Status: DC
  Administered 2015-05-23: 15 mL via OROMUCOSAL

## 2015-05-23 MED ORDER — SODIUM CHLORIDE 0.9 % IV SOLN
Freq: Once | INTRAVENOUS | Status: AC
  Administered 2015-05-23: 04:00:00 via INTRAVENOUS

## 2015-05-23 MED ORDER — SODIUM CHLORIDE 0.9 % IV SOLN
INTRAVENOUS | Status: DC
Start: 2015-05-23 — End: 2015-05-24

## 2015-05-23 NOTE — Progress Notes (Signed)
Pt attempted to call his brother and daughter to come pick him up from the hospital due to his desire to leave AMA.  The patient was demanding to be given a glass of water.  He was informed that he was under orders to remain NPO due to the likelihood for the need for further GI procedures to determine the source of his GI bleeding.  This RN paged Kathline Magic NP to inform him of the situation and he advised to allow the patient to have a few sips of water along with continued education as to why the NPO order is in place.  The patient was given approximately 120 mL of ice water.  Will continue to monitor.

## 2015-05-23 NOTE — Progress Notes (Signed)
[place] TEAM 1 - Stepdown/ICU TEAM PROGRESS NOTE  Dustin Estes. HC:[id number] DOB: 1951-01-21 DOA: 05/21/2015 PCP: [name], MD  Admit HPI / Brief Narrative: 65 y.o. male who presented to the ED w/ altered mental state.  He was reportedly at home with family when he suddenly became weak and slumped over and refused to respond to family members or get up. Patient refused to come to the emergency room therefore the daughter went to the magistrate and obtained paperwork authorizing her to force him into the hospital. Family reported malonic stools at home over a few days.   HPI/Subjective: The patient is resting comfortably in bed.  He denies nausea vomiting abdom pain chest pain shortness breath fevers chills.  He is very hungry and has been quite agitated that he has not been allowed to have more than just a few ice chips today.  Assessment/Plan:  Acute encephalopathy Appears to be improving with volume resuscitation  UGIB due to large duodenal ulcer - ulcer in gastric antrum - erosive gastritis  IV protonix continues, to complete 72hrs of tx - tx w/ clips, epi, and cautery at time of EGD - if re-bleeds will need IR angio/embolization - advance to clear liquids today and follow  Anemia of acute blood loss Hgb 7.6 on presentation - s/p 2U PRBC - Hgb stabilizing/improving - follow in serial fashion   Acute kidney injury Renal function improving - follow trend   History of HTN not an active problem at the present time  DM2 CBG is reasonably controlled at the present time  HLD hold treatment until diet resume  Code Status: FULL Family Communication: no family present at time of exam Disposition Plan: SDU  Consultants: GI at [facility]  Procedures: 2/3 TTE - ejection fraction 55-60% - no wall motion abnormalities  - grade 1 diastolic dysfunction  2/3 EGD - large duodenal ulcer - ulcer in gastric antrum - erosive gastritis   Antibiotics: none  DVT  prophylaxis: SCDs  Objective: Blood pressure 103/48, pulse 83, temperature 97.3 F (36.3 C), temperature source Oral, resp. rate 29, height 5\' 9"  (1.753 m), weight 81.4 kg (179 lb 7.3 oz), SpO2 97 %.  Intake/Output Summary (Last 24 hours) at 05/23/15 1546 Last data filed at 05/23/15 1500  Gross per 24 hour  Intake   4748 ml  Output    500 ml  Net   4248 ml   Exam: General: No acute respiratory distress Lungs: Clear to auscultation bilaterally without wheezes or crackles Cardiovascular: Regular rate and rhythm without murmur gallop or rub normal S1 and S2 Abdomen: Nontender, nondistended, soft, bowel sounds positive, no rebound, no ascites, no appreciable mass Extremities: No significant cyanosis, clubbing, or edema bilateral lower extremities  Data Reviewed:  Basic Metabolic Panel:  Recent Labs Lab 05/21/15 1838 05/22/15 0602 05/23/15 0340  NA 135 136 140  K 3.8 3.8 4.1  CL 99* 101 109  CO2 28 30 23   GLUCOSE 196* 156* 173*  BUN 62* 39* 23*  CREATININE 1.21 1.11 0.89  CALCIUM 7.8* 7.9* 6.8*    CBC:  Recent Labs Lab 05/21/15 1838 05/22/15 0602 05/22/15 1226 05/22/15 1919 05/22/15 2358 05/23/15 0340 05/23/15 1502  WBC 11.6* 12.3*  --   --  17.0* 13.6* 10.4  NEUTROABS 8.4*  --   --   --   --   --  PENDING  HGB 7.6* 9.8* 9.5* 9.2* 9.0* 8.2* 8.7*  HCT 22.5* 29.0* 28.4* 26.6* 25.9* 22.4* 24.9*  MCV 96.6  94.2  --   --  91.8 90.7 90.2  PLT 223 211  --   --  159 130* 106*    Liver Function Tests:  Recent Labs Lab 05/21/15 1838 05/22/15 0602  AST 62* 39  ALT 45 39  ALKPHOS 68 50  BILITOT 0.4 0.9  PROT 5.4* 5.7*  ALBUMIN 2.5* 2.7*    Recent Labs Lab 05/21/15 1838  LIPASE 59*    Recent Labs Lab 05/21/15 1838  AMMONIA 20    Coags:  Recent Labs Lab 05/22/15 0602  INR 1.27    Recent Labs Lab 05/22/15 0602  APTT 28    Cardiac Enzymes:  Recent Labs Lab 05/21/15 1838  TROPONINI <0.03    CBG:  Recent Labs Lab 05/22/15 1131  05/22/15 1424 05/22/15 2110 05/23/15 0841 05/23/15 1306  GLUCAP 133* 138* 151* 145* 132*    Recent Results (from the past 240 hour(s))  Culture, blood (routine x 2)     Status: None (Preliminary result)   Collection Time: 05/21/15 11:15 PM  Result Value Ref Range Status   Specimen Description BLOOD LEFT ARM  Final   Special Requests BOTTLES DRAWN AEROBIC AND ANAEROBIC 6CC EACH  Final   Culture NO GROWTH < 12 HOURS  Final   Report Status PENDING  Incomplete  Culture, blood (routine x 2)     Status: None (Preliminary result)   Collection Time: 05/21/15 11:18 PM  Result Value Ref Range Status   Specimen Description BLOOD RIGHT ANTECUBITAL  Final   Special Requests BOTTLES DRAWN AEROBIC AND ANAEROBIC RAC  Final   Culture NO GROWTH < 12 HOURS  Final   Report Status PENDING  Incomplete  MRSA PCR Screening     Status: None   Collection Time: 05/22/15  9:11 PM  Result Value Ref Range Status   MRSA by PCR NEGATIVE NEGATIVE Final    Comment:        The GeneXpert MRSA Assay (FDA approved for NASAL specimens only), is one component of a comprehensive MRSA colonization surveillance program. It is not intended to diagnose MRSA infection nor to guide or monitor treatment for MRSA infections.      Studies:   Recent x-ray studies have been reviewed in detail by the Attending Physician  Scheduled Meds:  Scheduled Meds: . antiseptic oral rinse  7 mL Mouth Rinse q12n4p  . chlorhexidine  15 mL Mouth Rinse BID  . insulin aspart  0-5 Units Subcutaneous QHS  . insulin aspart  0-9 Units Subcutaneous TID WC  . sodium chloride flush  3 mL Intravenous Q12H    Time spent on care of this patient: 35 mins   Dustin Estes , MD   [facility] Office  [phone] Pager - Text Page per Shea Evans as per below:  On-Call/Text Page:      [email or web address]      password [email or web address]  If 7PM-7AM, please contact night-coverage [email or web address] Password [email or web address] 05/23/2015, 3:46 PM   LOS: 2 days

## 2015-05-24 LAB — CBC
HEMATOCRIT: 24.9 % — AB (ref 39.0–52.0)
HEMATOCRIT: 27 % — AB (ref 39.0–52.0)
HEMOGLOBIN: 8.3 g/dL — AB (ref 13.0–17.0)
Hemoglobin: 9.4 g/dL — ABNORMAL LOW (ref 13.0–17.0)
MCH: 30.3 pg (ref 26.0–34.0)
MCH: 31.6 pg (ref 26.0–34.0)
MCHC: 33.3 g/dL (ref 30.0–36.0)
MCHC: 34.8 g/dL (ref 30.0–36.0)
MCV: 90.9 fL (ref 78.0–100.0)
MCV: 90.9 fL (ref 78.0–100.0)
PLATELETS: 115 10*3/uL — AB (ref 150–400)
Platelets: 96 10*3/uL — ABNORMAL LOW (ref 150–400)
RBC: 2.74 MIL/uL — AB (ref 4.22–5.81)
RBC: 2.97 MIL/uL — ABNORMAL LOW (ref 4.22–5.81)
RDW: 14.7 % (ref 11.5–15.5)
RDW: 14.5 % (ref 11.5–15.5)
WBC: 8.2 10*3/uL (ref 4.0–10.5)
WBC: 8.5 10*3/uL (ref 4.0–10.5)

## 2015-05-24 LAB — TYPE AND SCREEN
ABO/RH(D): O POS
ANTIBODY SCREEN: NEGATIVE
UNIT DIVISION: 0
UNIT DIVISION: 0

## 2015-05-24 LAB — RETICULOCYTES
RBC.: 2.74 MIL/uL — ABNORMAL LOW (ref 4.22–5.81)
RETIC COUNT ABSOLUTE: 104.1 10*3/uL (ref 19.0–186.0)
Retic Ct Pct: 3.8 % — ABNORMAL HIGH (ref 0.4–3.1)

## 2015-05-24 LAB — COMPREHENSIVE METABOLIC PANEL
ALBUMIN: 1.9 g/dL — AB (ref 3.5–5.0)
ALK PHOS: 35 U/L — AB (ref 38–126)
ALT: 19 U/L (ref 17–63)
AST: 17 U/L (ref 15–41)
Anion gap: 5 (ref 5–15)
BUN: 11 mg/dL (ref 6–20)
CALCIUM: 7.2 mg/dL — AB (ref 8.9–10.3)
CO2: 27 mmol/L (ref 22–32)
Chloride: 107 mmol/L (ref 101–111)
Creatinine, Ser: 0.87 mg/dL (ref 0.61–1.24)
GFR calc Af Amer: >60 mL/min (ref >60)
GFR calc non Af Amer: >60 mL/min (ref >60)
GLUCOSE: 107 mg/dL — AB (ref 65–99)
POTASSIUM: 3.6 mmol/L (ref 3.5–5.1)
Sodium: 139 mmol/L (ref 135–145)
Total Bilirubin: 0.5 mg/dL (ref 0.3–1.2)
Total Protein: 3.6 g/dL — ABNORMAL LOW (ref 6.5–8.1)

## 2015-05-24 LAB — GLUCOSE, CAPILLARY
GLUCOSE-CAPILLARY: 127 mg/dL — AB (ref 65–99)
Glucose-Capillary: 97 mg/dL (ref 65–99)
Glucose-Capillary: 121 mg/dL — ABNORMAL HIGH (ref 65–99)
Glucose-Capillary: 135 mg/dL — ABNORMAL HIGH (ref 65–99)

## 2015-05-24 LAB — IRON AND TIBC
IRON: 19 ug/dL — AB (ref 45–182)
Saturation Ratios: 10 % — ABNORMAL LOW (ref 17.9–39.5)
TIBC: 190 ug/dL — ABNORMAL LOW (ref 250–450)
UIBC: 171 ug/dL

## 2015-05-24 LAB — VITAMIN B12: Vitamin B-12: 186 pg/mL (ref 180–914)

## 2015-05-24 LAB — FOLATE: FOLATE: 13.1 ng/mL (ref >5.9)

## 2015-05-24 LAB — FERRITIN: Ferritin: 53 ng/mL (ref 24–336)

## 2015-05-24 MED ORDER — SODIUM CHLORIDE 0.9 % IV SOLN
250.0000 mg | Freq: Once | INTRAVENOUS | Status: AC
  Administered 2015-05-24: 250 mg via INTRAVENOUS
  Filled 2015-05-24 (×2): qty 20

## 2015-05-24 MED ORDER — CYANOCOBALAMIN 1000 MCG/ML IJ SOLN
1000.0000 ug | Freq: Every day | INTRAMUSCULAR | Status: DC
  Administered 2015-05-24 – 2015-05-26 (×3): 1000 ug via SUBCUTANEOUS
  Filled 2015-05-24 (×3): qty 1

## 2015-05-24 NOTE — Progress Notes (Signed)
[place] TEAM 1 - Stepdown/ICU TEAM PROGRESS NOTE  Dustin Estes. HC:[id number] DOB: Aug 24, 1950 DOA: 05/21/2015 PCP: [name], MD  Admit HPI / Brief Narrative: 65 y.o. male who presented to the ED w/ altered mental state.  He was reportedly at home with family when he suddenly became weak and slumped over and refused to respond to family members or get up. Patient refused to come to the emergency room therefore the daughter went to the magistrate and obtained paperwork authorizing her to force him into the hospital. Family reported malonic stools at home over a few days.   HPI/Subjective: The pt is resting comfortably and voices no complaints.  He denies cp, sob, n/v, or abdom pain.  He has note noted any bleeding.  He asks to be d/c home today.    Assessment/Plan:  Acute encephalopathy Appears to be improving with volume resuscitation - B12 to be replaced - follow   UGIB due to large duodenal ulcer - ulcer in gastric antrum - erosive gastritis  IV protonix continues, to complete 72hrs of tx - tx w/ clips, epi, and cautery at time of EGD - if re-bleeds will need IR angio/embolization - BUN continues to trend down, and pt becoming more hemodynamically stable - though Hgb drifting down w/ hydration, I do not currently suspect ongoing bleeding - advance diet to soft and follow   Anemia of acute blood loss - severe Fe deficiency  Hgb 7.6 on presentation - s/p 2U PRBC - Hgb trending down, but pt also being hydrated - follow in serial fashion - load w/ IV Fe - do not presently suspect ongoing blood loss  Severe B12 deficiency Begin load w/ daily SQ injections - transition to oral at time of d/c w/ f/u level in 6-8 weeks to determine if ongoing SQ/IM dosing required   Acute kidney injury Resolved w/ volume resuscitation   History of HTN not an active problem at the present time  DM2 CBG is reasonably controlled - no change in tx plan today   HLD hold treatment until diet fully  resumed  Code Status: FULL Family Communication: no family present at time of exam Disposition Plan: SDU  Consultants: GI at [facility]  Procedures: 2/3 TTE - ejection fraction 55-60% - no wall motion abnormalities  - grade 1 diastolic dysfunction  2/3 EGD - large duodenal ulcer - ulcer in gastric antrum - erosive gastritis   Antibiotics: none  DVT prophylaxis: SCDs  Objective: Blood pressure 110/53, pulse 82, temperature 99.1 F (37.3 C), temperature source Oral, resp. rate 24, height 5\' 9"  (1.753 m), weight 81.4 kg (179 lb 7.3 oz), SpO2 96 %.  Intake/Output Summary (Last 24 hours) at 05/24/15 1158 Last data filed at 05/24/15 1100  Gross per 24 hour  Intake 2990.83 ml  Output   2125 ml  Net 865.83 ml   Exam: General: No acute respiratory distress - more alert and conversant  Lungs: Clear to auscultation bilaterally without crackles Cardiovascular: Regular rate and rhythm without murmur gallop or rub  Abdomen: Nontender, nondistended, soft, bowel sounds positive, no rebound, no ascites, no appreciable mass Extremities: No significant cyanosis, clubbing, edema bilateral lower extremities  Data Reviewed:  Basic Metabolic Panel:  Recent Labs Lab 05/21/15 1838 05/22/15 0602 05/23/15 0340 05/24/15 0335  NA 135 136 140 139  K 3.8 3.8 4.1 3.6  CL 99* 101 109 107  CO2 28 30 23 27   GLUCOSE 196* 156* 173* 107*  BUN 62* 39* 23* 11  CREATININE 1.21 1.11 0.89 0.87  CALCIUM 7.8* 7.9* 6.8* 7.2*    CBC:  Recent Labs Lab 05/21/15 1838  05/22/15 2358 05/23/15 0340 05/23/15 1502 05/23/15 1816 05/24/15 0335  WBC 11.6*  < > 17.0* 13.6* 10.4 10.5 8.2  NEUTROABS 8.4*  --   --   --  7.5  --   --   HGB 7.6*  < > 9.0* 8.2* 8.7* 9.3* 8.3*  HCT 22.5*  < > 25.9* 22.4* 24.9* 26.8* 24.9*  MCV 96.6  < > 91.8 90.7 90.2 90.5 90.9  PLT 223  < > 159 130* 106* 111* 96*  < > = values in this interval not displayed.  Liver Function Tests:  Recent Labs Lab 05/21/15 1838 05/22/15 0602  05/24/15 0335  AST 62* 39 17  ALT 45 39 19  ALKPHOS 68 50 35*  BILITOT 0.4 0.9 0.5  PROT 5.4* 5.7* 3.6*  ALBUMIN 2.5* 2.7* 1.9*    Recent Labs Lab 05/21/15 1838  LIPASE 59*    Recent Labs Lab 05/21/15 1838  AMMONIA 20    Coags:  Recent Labs Lab 05/22/15 0602  INR 1.27    Recent Labs Lab 05/22/15 0602  APTT 28    Cardiac Enzymes:  Recent Labs Lab 05/21/15 1838  TROPONINI <0.03    CBG:  Recent Labs Lab 05/22/15 2110 05/23/15 0841 05/23/15 1306 05/23/15 1758 05/23/15 2139  GLUCAP 151* 145* 132* 129* 140*    Recent Results (from the past 240 hour(s))  Culture, blood (routine x 2)     Status: None (Preliminary result)   Collection Time: 05/21/15 11:15 PM  Result Value Ref Range Status   Specimen Description BLOOD LEFT ARM  Final   Special Requests BOTTLES DRAWN AEROBIC AND ANAEROBIC 6CC EACH  Final   Culture NO GROWTH 3 DAYS  Final   Report Status PENDING  Incomplete  Culture, blood (routine x 2)     Status: None (Preliminary result)   Collection Time: 05/21/15 11:18 PM  Result Value Ref Range Status   Specimen Description BLOOD RIGHT ANTECUBITAL  Final   Special Requests BOTTLES DRAWN AEROBIC AND ANAEROBIC RAC  Final   Culture NO GROWTH 3 DAYS  Final   Report Status PENDING  Incomplete  MRSA PCR Screening     Status: None   Collection Time: 05/22/15  9:11 PM  Result Value Ref Range Status   MRSA by PCR NEGATIVE NEGATIVE Final    Comment:        The GeneXpert MRSA Assay (FDA approved for NASAL specimens only), is one component of a comprehensive MRSA colonization surveillance program. It is not intended to diagnose MRSA infection nor to guide or monitor treatment for MRSA infections.      Studies:   Recent x-ray studies have been reviewed in detail by the Attending Physician  Scheduled Meds:  Scheduled Meds: . antiseptic oral rinse  7 mL Mouth Rinse q12n4p  . chlorhexidine  15 mL Mouth Rinse BID  . cyanocobalamin  1,000 mcg  Subcutaneous Daily  . insulin aspart  0-5 Units Subcutaneous QHS  . insulin aspart  0-9 Units Subcutaneous TID WC  . nicotine  21 mg Transdermal Daily    Time spent on care of this patient: 35 mins   Foster Frericks T , MD   [facility] Office  [phone] Pager - Text Page per Shea Evans as per below:  On-Call/Text Page:      [email or web address]      password [email or web address]  If 7PM-7AM, please  contact night-coverage [email or web address] Password [email or web address] 05/24/2015, 11:58 AM   LOS: 3 days

## 2015-05-25 LAB — TYPE AND SCREEN
ABO/RH(D): O POS
Antibody Screen: NEGATIVE
UNIT DIVISION: 0
UNIT DIVISION: 0
UNIT DIVISION: 0
UNIT DIVISION: 0
Unit division: 0
Unit division: 0
Unit division: 0
Unit division: 0
Unit division: 0

## 2015-05-25 LAB — CBC
HCT: 25.7 % — ABNORMAL LOW (ref 39.0–52.0)
HCT: 26.8 % — ABNORMAL LOW (ref 39.0–52.0)
HEMOGLOBIN: 8.8 g/dL — AB (ref 13.0–17.0)
HEMOGLOBIN: 8.8 g/dL — AB (ref 13.0–17.0)
MCH: 31.2 pg (ref 26.0–34.0)
MCH: 30.1 pg (ref 26.0–34.0)
MCHC: 34.2 g/dL (ref 30.0–36.0)
MCHC: 32.8 g/dL (ref 30.0–36.0)
MCV: 91.1 fL (ref 78.0–100.0)
MCV: 91.8 fL (ref 78.0–100.0)
PLATELETS: 111 10*3/uL — AB (ref 150–400)
PLATELETS: 132 10*3/uL — AB (ref 150–400)
RBC: 2.82 MIL/uL — ABNORMAL LOW (ref 4.22–5.81)
RBC: 2.92 MIL/uL — AB (ref 4.22–5.81)
RDW: 14.6 % (ref 11.5–15.5)
RDW: 14.7 % (ref 11.5–15.5)
WBC: 7.5 10*3/uL (ref 4.0–10.5)
WBC: 6.7 10*3/uL (ref 4.0–10.5)

## 2015-05-25 LAB — PREPARE RBC (CROSSMATCH)

## 2015-05-25 LAB — COMPREHENSIVE METABOLIC PANEL
ALBUMIN: 2.1 g/dL — AB (ref 3.5–5.0)
ALT: 21 U/L (ref 17–63)
ANION GAP: 6 (ref 5–15)
AST: 23 U/L (ref 15–41)
Alkaline Phosphatase: 47 U/L (ref 38–126)
BUN: 6 mg/dL (ref 6–20)
CHLORIDE: 104 mmol/L (ref 101–111)
CO2: 28 mmol/L (ref 22–32)
Calcium: 7.8 mg/dL — ABNORMAL LOW (ref 8.9–10.3)
Creatinine, Ser: 0.87 mg/dL (ref 0.61–1.24)
GFR calc Af Amer: >60 mL/min (ref >60)
GFR calc non Af Amer: >60 mL/min (ref >60)
GLUCOSE: 115 mg/dL — AB (ref 65–99)
POTASSIUM: 3.6 mmol/L (ref 3.5–5.1)
SODIUM: 138 mmol/L (ref 135–145)
Total Bilirubin: 0.7 mg/dL (ref 0.3–1.2)
Total Protein: 4.5 g/dL — ABNORMAL LOW (ref 6.5–8.1)

## 2015-05-25 LAB — HEMOGLOBIN A1C
HEMOGLOBIN A1C: 5.7 % — AB (ref 4.8–5.6)
MEAN PLASMA GLUCOSE: 117 mg/dL

## 2015-05-25 LAB — GLUCOSE, CAPILLARY
GLUCOSE-CAPILLARY: 102 mg/dL — AB (ref 65–99)
GLUCOSE-CAPILLARY: 120 mg/dL — AB (ref 65–99)
GLUCOSE-CAPILLARY: 190 mg/dL — AB (ref 65–99)
GLUCOSE-CAPILLARY: 145 mg/dL — AB (ref 65–99)

## 2015-05-25 LAB — LIPASE, BLOOD: Lipase: 41 U/L (ref 11–51)

## 2015-05-25 MED ORDER — PANTOPRAZOLE SODIUM 40 MG PO TBEC
40.0000 mg | DELAYED_RELEASE_TABLET | Freq: Two times a day (BID) | ORAL | Status: DC
  Administered 2015-05-25 – 2015-06-01 (×14): 40 mg via ORAL
  Filled 2015-05-25 (×15): qty 1

## 2015-05-25 NOTE — Care Management Important Message (Signed)
Important Message  Patient Details  Name: Dustin Estes. MRN: [id number] Date of Birth: 01-20-1951   Medicare Important Message Given:  Yes    [name] 05/25/2015, 11:59 AMImportant Message  Patient Details  Name: Dustin Estes. MRN: [id number] Date of Birth: [birth year]   Medicare Important Message Given:  Yes    [name] 05/25/2015, 11:59 AM

## 2015-05-25 NOTE — Progress Notes (Signed)
[place] TEAM 1 - Stepdown/ICU TEAM PROGRESS NOTE  [name]. [id number] DOB: 24-Apr-1950 DOA: 05/21/2015 PCP: [name], MD  Admit HPI / Brief Narrative: 65 y.o. male who presented to the ED w/ altered mental state.  He was reportedly at home with family when he suddenly became weak and slumped over and refused to respond to family members or get up. Patient refused to come to the emergency room therefore the daughter went to the magistrate and obtained paperwork authorizing her to force him into the hospital. Family reported malonic stools at home over a few days.   HPI/Subjective: The patient is tolerating his diet well.  He denies any melena, hematochezia, hematemesis, abdominal discomfort, nausea, or vomiting.  He denies chest pain or shortness of breath.  Assessment/Plan:  Acute encephalopathy Mental status appears to be at baseline presently - B12 being replaced   UGIB due to large duodenal ulcer - ulcer in gastric antrum - erosive gastritis  IV protonix continues, to complete 72hrs of tx at which time we will transition to oral therapy - tx w/ clips, epi, and cautery at time of EGD - if re-bleeds will need IR angio/embolization - BUN continues to trend down, and pt has become hemodynamically stable - tolerating soft diet  Anemia of acute blood loss - severe Fe deficiency  Hgb 7.6 on presentation - s/p 2U PRBC - Hgb appears to be stabilizing around 8.5 - loaded w/ IV Fe - do not presently suspect ongoing blood loss - recheck hemoglobin in a.m.  Severe B12 deficiency loading w/ daily SQ injections - transition to oral at time of d/c w/ f/u level in 6-8 weeks to determine if ongoing SQ/IM dosing required   Acute kidney injury Resolved w/ volume resuscitation   History of HTN not an active problem at the present time  DM2 CBG is controlled - no change in tx plan today   HLD hold treatment until diet fully resumed  Code Status: FULL Family Communication: no  family present at time of exam Disposition Plan: SDU - recheck hemoglobin in a.m. and consider discharge if stable - PT/OT  Consultants: GI at [facility]  Procedures: 2/3 TTE - ejection fraction 55-60% - no wall motion abnormalities  - grade 1 diastolic dysfunction  2/3 EGD - large duodenal ulcer - ulcer in gastric antrum - erosive gastritis   Antibiotics: none  DVT prophylaxis: SCDs  Objective: Blood pressure 105/73, pulse 93, temperature 97.5 F (36.4 C), temperature source Oral, resp. rate 25, height 5\' 9"  (1.753 m), weight 81.4 kg (179 lb 7.3 oz), SpO2 95 %.  Intake/Output Summary (Last 24 hours) at 05/25/15 1404 Last data filed at 05/25/15 1300  Gross per 24 hour  Intake   1430 ml  Output   1190 ml  Net    240 ml   Exam: General: No acute respiratory distress - alert and conversant  Lungs: Clear to auscultation bilaterally without crackles or wheezes Cardiovascular: Regular rate and rhythm without murmur  Abdomen: Nontender, nondistended, soft, bowel sounds positive, no rebound, no ascites, no appreciable mass Extremities: No significant cyanosis, clubbing, or edema bilateral lower extremities  Data Reviewed:  Basic Metabolic Panel:  Recent Labs Lab 05/21/15 1838 05/22/15 0602 05/23/15 0340 05/24/15 0335 05/25/15 0525  NA 135 136 140 139 138  K 3.8 3.8 4.1 3.6 3.6  CL 99* 101 109 107 104  CO2 28 30 23 27 28   GLUCOSE 196* 156* 173* 107* 115*  BUN 62* 39* 23*  11 6  CREATININE 1.21 1.11 0.89 0.87 0.87  CALCIUM 7.8* 7.9* 6.8* 7.2* 7.8*    CBC:  Recent Labs Lab 05/21/15 1838  05/23/15 1502 05/23/15 1816 05/24/15 0335 05/24/15 1835 05/25/15 0525  WBC 11.6*  < > 10.4 10.5 8.2 8.5 7.5  NEUTROABS 8.4*  --  7.5  --   --   --   --   HGB 7.6*  < > 8.7* 9.3* 8.3* 9.4* 8.8*  HCT 22.5*  < > 24.9* 26.8* 24.9* 27.0* 25.7*  MCV 96.6  < > 90.2 90.5 90.9 90.9 91.1  PLT 223  < > 106* 111* 96* 115* 111*  < > = values in this interval not displayed.  Liver Function  Tests:  Recent Labs Lab 05/21/15 1838 05/22/15 0602 05/24/15 0335 05/25/15 0525  AST 62* 39 17 23  ALT 45 39 19 21  ALKPHOS 68 50 35* 47  BILITOT 0.4 0.9 0.5 0.7  PROT 5.4* 5.7* 3.6* 4.5*  ALBUMIN 2.5* 2.7* 1.9* 2.1*    Recent Labs Lab 05/21/15 1838 05/25/15 0525  LIPASE 59* 41    Recent Labs Lab 05/21/15 1838  AMMONIA 20    Coags:  Recent Labs Lab 05/22/15 0602  INR 1.27    Recent Labs Lab 05/22/15 0602  APTT 28    Cardiac Enzymes:  Recent Labs Lab 05/21/15 1838  TROPONINI <0.03    CBG:  Recent Labs Lab 05/24/15 1245 05/24/15 1704 05/24/15 2220 05/25/15 0737 05/25/15 1218  GLUCAP 127* 121* 135* 102* 120*    Recent Results (from the past 240 hour(s))  Culture, blood (routine x 2)     Status: None (Preliminary result)   Collection Time: 05/21/15 11:15 PM  Result Value Ref Range Status   Specimen Description BLOOD LEFT ARM  Final   Special Requests BOTTLES DRAWN AEROBIC AND ANAEROBIC 6CC EACH  Final   Culture NO GROWTH 4 DAYS  Final   Report Status PENDING  Incomplete  Culture, blood (routine x 2)     Status: None (Preliminary result)   Collection Time: 05/21/15 11:18 PM  Result Value Ref Range Status   Specimen Description BLOOD RIGHT ANTECUBITAL  Final   Special Requests BOTTLES DRAWN AEROBIC AND ANAEROBIC RAC  Final   Culture NO GROWTH 4 DAYS  Final   Report Status PENDING  Incomplete  MRSA PCR Screening     Status: None   Collection Time: 05/22/15  9:11 PM  Result Value Ref Range Status   MRSA by PCR NEGATIVE NEGATIVE Final    Comment:        The GeneXpert MRSA Assay (FDA approved for NASAL specimens only), is one component of a comprehensive MRSA colonization surveillance program. It is not intended to diagnose MRSA infection nor to guide or monitor treatment for MRSA infections.      Studies:   Recent x-ray studies have been reviewed in detail by the Attending Physician  Scheduled Meds:  Scheduled Meds: .  cyanocobalamin  1,000 mcg Subcutaneous Daily  . insulin aspart  0-5 Units Subcutaneous QHS  . insulin aspart  0-9 Units Subcutaneous TID WC  . nicotine  21 mg Transdermal Daily    Time spent on care of this patient: 35 mins   MCCLUNG,JEFFREY T , MD   [facility] Office  [phone] Pager - Text Page per Shea Evans as per below:  On-Call/Text Page:      [email or web address]      password [email or web address]  If 7PM-7AM, please contact night-coverage [email or web address] Password  [email or web address] 05/25/2015, 2:04 PM   LOS: 4 days

## 2015-05-25 NOTE — Plan of Care (Signed)
Problem: Physical Regulation: Goal: Ability to maintain clinical measurements within normal limits will improve Outcome: Progressing Patient's vital signs are stable. Pt's blood pressure readings have improved significantly. Pt is alert and oriented but he screams across hallway when in need of something. RN educated pt on the use of call light instead of screaming. Pt verbalized understanding. No complains of pain or discomfort. No distress noted. Pt comfortably resting. Will continue to monitor.   Problem: Activity: Goal: Risk for activity intolerance will decrease Outcome: Progressing Pt understands to call for help when getting out of bed. No falls or injuries this shift. Patient educated about fall prevention. Pt verbalized understanding.

## 2015-05-26 ENCOUNTER — Encounter (HOSPITAL_COMMUNITY): Payer: Self-pay | Admitting: Gastroenterology

## 2015-05-26 DIAGNOSIS — D62 Acute posthemorrhagic anemia: Secondary | ICD-10-CM | POA: Diagnosis present

## 2015-05-26 DIAGNOSIS — E1121 Type 2 diabetes mellitus with diabetic nephropathy: Secondary | ICD-10-CM | POA: Diagnosis present

## 2015-05-26 LAB — CULTURE, BLOOD (ROUTINE X 2)
CULTURE: NO GROWTH
Culture: NO GROWTH

## 2015-05-26 LAB — CBC
HEMATOCRIT: 25.2 % — AB (ref 39.0–52.0)
HEMOGLOBIN: 8.3 g/dL — AB (ref 13.0–17.0)
MCH: 30 pg (ref 26.0–34.0)
MCHC: 32.9 g/dL (ref 30.0–36.0)
MCV: 91 fL (ref 78.0–100.0)
Platelets: 127 10*3/uL — ABNORMAL LOW (ref 150–400)
RBC: 2.77 MIL/uL — AB (ref 4.22–5.81)
RDW: 14.7 % (ref 11.5–15.5)
WBC: 6.3 10*3/uL (ref 4.0–10.5)

## 2015-05-26 LAB — GLUCOSE, CAPILLARY
GLUCOSE-CAPILLARY: 120 mg/dL — AB (ref 65–99)
GLUCOSE-CAPILLARY: 230 mg/dL — AB (ref 65–99)
Glucose-Capillary: 125 mg/dL — ABNORMAL HIGH (ref 65–99)
Glucose-Capillary: 181 mg/dL — ABNORMAL HIGH (ref 65–99)

## 2015-05-26 MED ORDER — NICOTINE 21 MG/24HR TD PT24: 21.0000 mg | MEDICATED_PATCH | Freq: Every day | TRANSDERMAL | Status: DC

## 2015-05-26 MED ORDER — PANTOPRAZOLE SODIUM 40 MG PO TBEC: 40.0000 mg | DELAYED_RELEASE_TABLET | Freq: Two times a day (BID) | ORAL | Status: DC

## 2015-05-26 MED ORDER — CYANOCOBALAMIN 500 MCG PO TABS: 500.0000 ug | ORAL_TABLET | Freq: Every day | ORAL | Status: DC

## 2015-05-26 MED ORDER — VITAMIN B-12 1000 MCG PO TABS
500.0000 ug | ORAL_TABLET | Freq: Every day | ORAL | Status: DC
  Administered 2015-05-27: 500 ug via ORAL
  Filled 2015-05-26: qty 1

## 2015-05-26 NOTE — Evaluation (Signed)
Physical Therapy Evaluation Patient Details Name: Dustin Estes. MRN: [id number] DOB: 25-Jan-1951 Today's Date: 05/26/2015   History of Present Illness  65 y.o. male who presented to the ED w/ altered mental state. He was reportedly at home with family when he suddenly became weak and slumped over and refused to respond to family members or get up. Patient refused to come to the emergency room therefore the daughter went to the magistrate and obtained paperwork authorizing her to force him into the hospital. Family reported malonic stools at home over a few days.   Clinical Impression  Pt admitted with above diagnosis. Pt currently with functional limitations due to the deficits listed below (see PT Problem List). Pt was able to ambulate with RW that he obtained during previous admission.  Pt with good overall safety with RW and states he will use the RW at home and that his daughter can assist prn.  Will follow acutely.   Pt will benefit from skilled PT to increase their independence and safety with mobility to allow discharge to the venue listed below.     Follow Up Recommendations Home health PT;Supervision/Assistance - 24 hour    Equipment Recommendations  None recommended by PT    Recommendations for Other Services       Precautions / Restrictions Precautions Precautions: Fall Restrictions Weight Bearing Restrictions: No      Mobility  Bed Mobility Overal bed mobility: Independent                Transfers Overall transfer level: Needs assistance Equipment used: Rolling walker (2 wheeled) Transfers: Sit to/from Stand Sit to Stand: Min guard         General transfer comment: Pt needed steadying asssit for static stance.  Ambulation/Gait Ambulation/Gait assistance: Min guard;Min assist Ambulation Distance (Feet): 250 Feet (125 feet with RW, 125 feet without RW) Assistive device: Rolling walker (2 wheeled) Gait Pattern/deviations: Step-through pattern;Decreased  stride length;Staggering right;Staggering left;Drifts right/left   Gait velocity interpretation: Below normal speed for age/gender General Gait Details: Pt ambulated with RW with good stability overall. Needed cues to keep RW close to him and not push it too far ahead. Pt then ambulated without device with need for close guard assist as pt staggers left and right needing steadying assist throughout. Pt was unable to withstand challenges to balance when he didn't have device therefore needs to use RW at home.     Stairs Stairs: Yes Stairs assistance: Supervision Stair Management: Alternating pattern;Forwards;One rail Left Number of Stairs: 3 General stair comments: Pt started up the steps quickly with somewhat flexed knees.  Pt was using rails even though he doesn't have rails at home.  Pt instructed to slow down on steps.  Pt agrees he will have someone with him at home when going up and down steps.   Wheelchair Mobility    Modified Rankin (Stroke Patients Only)       Balance Overall balance assessment: Needs assistance;History of Falls Sitting-balance support: No upper extremity supported;Feet supported Sitting balance-Leahy Scale: Fair     Standing balance support: Bilateral upper extremity supported;During functional activity Standing balance-Leahy Scale: Poor Standing balance comment: relies on UE support on RW for balance.              High level balance activites: Direction changes;Turns;Sudden stops High Level Balance Comments: Pt only min guard asssit to supervision with above with RW.               Pertinent  Vitals/Pain Pain Assessment: No/denies pain  94-150bpm, 99% RA, 106/84    Home Living Family/patient expects to be discharged to:: Private residence Living Arrangements: Alone Available Help at Discharge: Family;Available 24 hours/day (says daughter can stay 24 hours initially) Type of Home: House Home Access: Stairs to enter Entrance Stairs-Rails:  None Entrance Stairs-Number of Steps: 3 Home Layout: One level Home Equipment: Walker - 2 wheels      Prior Function Level of Independence: Independent         Comments: Recently got a RW when he left [facility] but he had not gotten a chance to use it.       Hand Dominance        Extremity/Trunk Assessment   Upper Extremity Assessment: Defer to OT evaluation           Lower Extremity Assessment: Generalized weakness      Cervical / Trunk Assessment: Normal  Communication   Communication: No difficulties  Cognition Arousal/Alertness: Awake/alert Behavior During Therapy: WFL for tasks assessed/performed Overall Cognitive Status: Within Functional Limits for tasks assessed                      General Comments      Exercises General Exercises - Lower Extremity Ankle Circles/Pumps: AROM;Both;10 reps;Supine      Assessment/Plan    PT Assessment Patient needs continued PT services  PT Diagnosis Generalized weakness   PT Problem List Decreased balance;Decreased activity tolerance;Decreased mobility;Decreased coordination;Decreased cognition;Decreased knowledge of use of DME;Decreased safety awareness;Decreased knowledge of precautions  PT Treatment Interventions DME instruction;Gait training;Functional mobility training;Therapeutic activities;Therapeutic exercise;Stair training;Balance training;Patient/family education   PT Goals (Current goals can be found in the Care Plan section) Acute Rehab PT Goals Patient Stated Goal: to go home PT Goal Formulation: With patient Time For Goal Achievement: 06/09/15 Potential to Achieve Goals: Good    Frequency Min 3X/week   Barriers to discharge        Co-evaluation               End of Session Equipment Utilized During Treatment: Gait belt Activity Tolerance: Patient limited by fatigue Patient left: in chair;with call bell/phone within reach;with chair alarm set Nurse Communication: Mobility  status         Time: [id number] PT Time Calculation (min) (ACUTE ONLY): 18 min   Charges:   PT Evaluation $PT Eval Moderate Complexity: 1 Procedure     PT G CodesIrwin Brakeman F 06/23/2015, 10:00 AM Melba Araki,PT Acute Rehabilitation [phone] (pager)

## 2015-05-26 NOTE — Discharge Summary (Signed)
Physician Discharge Summary  Dustin Estes. [id number] DOB: March 23, 1951 DOA: 05/21/2015  PCP: [name], MD  Admit date: 05/21/2015 Discharge date: 05/26/2015  Time spent: 35 minutes  Recommendations for Outpatient Follow-up:   Acute encephalopathy -Resolved    UGIB due to large duodenal ulcer - ulcer in gastric antrum - erosive gastritis  -Protonix 40 mg BID -Schedule follow-up appointment for 1 month with Dr. Marga Melnick Fields upper GI bleed  Anemia of acute blood loss - severe Fe deficiency  -Hgb 7.6 on presentation - s/p 2U PRBC, hemoglobin stabilizing around 8.5  -Follow-up with Dr. Juanita Craver 1-2 weeks for upper GI bleed, acute kidney injury, HTN  Severe B12 deficiency -loading w/ daily SQ injections  - Vitamin B 12 500 g daily  f/u level in 6-8 weeks with PCP to determine if ongoing SQ/IM dosing required   Acute kidney injury -Resolved w/ volume resuscitation   HTN -not an active problem at the present time -PCP to determine if/when patient needs to start HTN medication  DM Type 2 controlled -2/2 hemoglobin A1c =5.7 -CBG is controlled - no change in tx plan today   HLD -Fenofibrate 160 mg QHS -Zocor 40 mg daily      Discharge Diagnoses:  Active Problems:   HTN (hypertension)   DM2 (diabetes mellitus, type 2) ([place])   Dyslipidemia   Pacemaker   Second degree AV block, Mobitz type II   Upper GI bleed   Near syncope   Acute encephalopathy   Severe anemia   Controlled type 2 diabetes mellitus with diabetic nephropathy (HCC)   Acute blood loss anemia   Discharge Condition: Stable  Diet recommendation: American diabetic Association  Filed Weights   05/21/15 1710 05/21/15 1717 05/21/15 2158  Weight: 98.431 kg (217 lb) 90.719 kg (200 lb) 81.4 kg (179 lb 7.3 oz)    History of present illness:  65 y.o. WM PMHx tobacco abuse, HTN, Diabetes Type 2 controlled,  Presented to the ED w/ altered mental state. He was reportedly at home with family  when he suddenly became weak and slumped over and refused to respond to family members or get up. Patient refused to come to the emergency room therefore the daughter went to the magistrate and obtained paperwork authorizing her to force him into the hospital. Family reported malonic stools at home over a few days.  During his hospitalization patient received 2 units PRBC secondary to GI bleed. Patient was seen by GI and received EGD which revealed large duodenal ulcer - ulcer in gastric antrum - erosive gastritis; directed submucosal injection: EPINEPHRINE.   Procedures: 2/3 TTE - ejection fraction 55-60% - no wall motion abnormalities - grade 1 diastolic dysfunction  2/3 EGD - large duodenal ulcer - ulcer in gastric antrum - erosive gastritis; directed submucosal injection: EPINEPHRINE    Consultations: Dr.Sandi L Fields GI     Discharge Exam: Filed Vitals:   05/26/15 0000 05/26/15 0400 05/26/15 0700 05/26/15 1100  BP: 111/51 122/73 127/69 102/53  Pulse:    81  Temp: 99.7 F (37.6 C) 98.6 F (37 C) 98.5 F (36.9 C) 97.6 F (36.4 C)  TempSrc: Oral Oral Axillary Oral  Resp: 27 28 23 21   Height:      Weight:      SpO2: 98% 99% 97% 95%    General: A/O 4, NAD, Cardiovascular: Regular rhythm and rate, negative murmurs rubs or gallops Respiratory: Clear to auscultation bilateral  Discharge Instructions     Medication List  STOP taking these medications        aspirin EC 325 MG tablet     diltiazem 360 MG 24 hr capsule  Commonly known as:  CARDIZEM CD     metoprolol tartrate 25 MG tablet  Commonly known as:  LOPRESSOR     quinapril 20 MG tablet  Commonly known as:  ACCUPRIL      TAKE these medications        cyanocobalamin 500 MCG tablet  Take 1 tablet (500 mcg total) by mouth daily.     fenofibrate 160 MG tablet  Take 160 mg by mouth at bedtime.     Flaxseed (Linseed) 1000 MG Caps  Take 1 capsule by mouth 2 (two) times daily.     glipiZIDE 10 MG  tablet  Commonly known as:  GLUCOTROL  Take 10 mg by mouth every morning.     metFORMIN 1000 MG tablet  Commonly known as:  GLUCOPHAGE  Take 1,000 mg by mouth 2 (two) times daily with a meal.     multivitamin with minerals Tabs tablet  Take 1 tablet by mouth at bedtime. Centrum Silver     niacin 500 MG tablet  Take 500 mg by mouth 2 (two) times daily with a meal.     nicotine 21 mg/24hr patch  Commonly known as:  NICODERM CQ - dosed in mg/24 hours  Place 1 patch (21 mg total) onto the skin daily.     pantoprazole 40 MG tablet  Commonly known as:  PROTONIX  Take 1 tablet (40 mg total) by mouth 2 (two) times daily.     simvastatin 40 MG tablet  Commonly known as:  ZOCOR  Take 40 mg by mouth at bedtime.       No Known Allergies Follow-up Information    Follow up with Barney Drain, MD. Schedule an appointment as soon as possible for a visit in 1 month.   Specialty:  Gastroenterology   Why:  Schedule follow-up appointment for 1 month with Dr. Danie Binder upper GI bleed   Contact information:   [address] [place] [address] [phone]       Follow up with BURNETT,BRENT A, MD In 2 weeks.   Specialty:  Family Medicine   Why:  Follow-up with Dr. Juanita Craver 1-2 weeks for upper GI bleed, acute kidney injury, HTN   Contact information:   [address] [place] [address] [phone]        The results of significant diagnostics from this hospitalization (including imaging, microbiology, ancillary and laboratory) are listed below for reference.    Significant Diagnostic Studies: Ct Head Wo Contrast  05/21/2015  CLINICAL DATA:  EMS reports pt drank 9 raw eggs this morning for unknown reason. Pt's family reported to EMS that pt is usually able to ambulate but hasn't been able to lately and fell to the floor today and was unable to get up. Pt's family also reported to EMS that pt hasn't ate or drank for several  weeks. EXAM: CT HEAD WITHOUT CONTRAST TECHNIQUE: Contiguous axial images were obtained from the base of the skull through the vertex without intravenous contrast. COMPARISON:  03/24/2010 FINDINGS: Ventricles are normal configuration. There is ventricular and sulcal enlargement reflecting mild atrophy. No hydrocephalus. There are no parenchymal masses or mass effect. Encephalomalacia along the lateral left frontal lobe is from a previous hemorrhage. There is an overlying craniotomy flap. There is hypoattenuation consistent  with additional encephalomalacia along the anterior, inferior frontal lobes bilaterally, likely from old trauma. There is no evidence of a recent cortical infarct. There are no extra-axial masses or abnormal fluid collections. There is no intracranial hemorrhage. Visualized sinuses and mastoid air cells are clear.  The IMPRESSION: 1. No acute intracranial abnormalities. 2. Status post left sided craniotomy. There is a focal area of left lateral frontal lobe encephalomalacia from previous hemorrhage and subsequent surgery. 3. Anterior inferior frontal lobe encephalomalacia likely from remote trauma. 4. Mild atrophy. Electronically Signed   By: Lajean Manes M.D.   On: 05/21/2015 19:01   Dg Chest Port 1 View  05/21/2015  CLINICAL DATA:  Altered mental status.  Fell today. EXAM: PORTABLE CHEST 1 VIEW COMPARISON:  04/30/2011 FINDINGS: Dual lead left-sided pacemaker in place. Leads project over the right atrium and ventricle. The cardiomediastinal contours are normal. Pulmonary vasculature is normal. No consolidation, pleural effusion, or pneumothorax. No acute osseous abnormalities are seen. IMPRESSION: No acute pulmonary process. Electronically Signed   By: Jeb Levering M.D.   On: 05/21/2015 19:42    Microbiology: Recent Results (from the past 240 hour(s))  Culture, blood (routine x 2)     Status: None   Collection Time: 05/21/15 11:15 PM  Result Value Ref Range Status   Specimen  Description BLOOD LEFT ARM  Final   Special Requests BOTTLES DRAWN AEROBIC AND ANAEROBIC 6CC EACH  Final   Culture NO GROWTH 5 DAYS  Final   Report Status 05/26/2015 FINAL  Final  Culture, blood (routine x 2)     Status: None   Collection Time: 05/21/15 11:18 PM  Result Value Ref Range Status   Specimen Description BLOOD RIGHT ANTECUBITAL  Final   Special Requests BOTTLES DRAWN AEROBIC AND ANAEROBIC 10CC EACH  Final   Culture NO GROWTH 5 DAYS  Final   Report Status 05/26/2015 FINAL  Final  MRSA PCR Screening     Status: None   Collection Time: 05/22/15  9:11 PM  Result Value Ref Range Status   MRSA by PCR NEGATIVE NEGATIVE Final    Comment:        The GeneXpert MRSA Assay (FDA approved for NASAL specimens only), is one component of a comprehensive MRSA colonization surveillance program. It is not intended to diagnose MRSA infection nor to guide or monitor treatment for MRSA infections.      Labs: Basic Metabolic Panel:  Recent Labs Lab 05/21/15 1838 05/22/15 0602 05/23/15 0340 05/24/15 0335 05/25/15 0525  NA 135 136 140 139 138  K 3.8 3.8 4.1 3.6 3.6  CL 99* 101 109 107 104  CO2 28 30 23 27 28   GLUCOSE 196* 156* 173* 107* 115*  BUN 62* 39* 23* 11 6  CREATININE 1.21 1.11 0.89 0.87 0.87  CALCIUM 7.8* 7.9* 6.8* 7.2* 7.8*   Liver Function Tests:  Recent Labs Lab 05/21/15 1838 05/22/15 0602 05/24/15 0335 05/25/15 0525  AST 62* 39 17 23  ALT 45 39 19 21  ALKPHOS 68 50 35* 47  BILITOT 0.4 0.9 0.5 0.7  PROT 5.4* 5.7* 3.6* 4.5*  ALBUMIN 2.5* 2.7* 1.9* 2.1*    Recent Labs Lab 05/21/15 1838 05/25/15 0525  LIPASE 59* 41    Recent Labs Lab 05/21/15 1838  AMMONIA 20   CBC:  Recent Labs Lab 05/21/15 1838  05/23/15 1502  05/24/15 0335 05/24/15 1835 05/25/15 0525 05/25/15 1816 05/26/15 0430  WBC 11.6*  < > 10.4  < > 8.2 8.5 7.5 6.7 6.3  NEUTROABS 8.4*  --  7.5  --   --   --   --   --   --   HGB 7.6*  < > 8.7*  < > 8.3* 9.4* 8.8* 8.8* 8.3*  HCT  22.5*  < > 24.9*  < > 24.9* 27.0* 25.7* 26.8* 25.2*  MCV 96.6  < > 90.2  < > 90.9 90.9 91.1 91.8 91.0  PLT 223  < > 106*  < > 96* 115* 111* 132* 127*  < > = values in this interval not displayed. Cardiac Enzymes:  Recent Labs Lab 05/21/15 1838  TROPONINI <0.03   BNP: BNP (last 3 results) No results for input(s): BNP in the last 8760 hours.  ProBNP (last 3 results) No results for input(s): PROBNP in the last 8760 hours.  CBG:  Recent Labs Lab 05/25/15 1218 05/25/15 1725 05/25/15 2222 05/26/15 0733 05/26/15 1225  GLUCAP 120* 190* 145* 125* 120*       Signed:  Dia Crawford, MD [facility] [phone] pager

## 2015-05-26 NOTE — Progress Notes (Addendum)
Occupational Therapy Evaluation Patient Details Name: Dustin Estes. MRN: [id number] DOB: [birth year] Today's Date: 05/26/2015    History of Present Illness 65 y.o. male who presented to the ED w/ altered mental state. He was reportedly at home with family when he suddenly became weak and slumped over and refused to respond to family members or get up. Patient refused to come to the emergency room therefore the daughter went to the magistrate and obtained paperwork authorizing her to force him into the hospital. Family reported malonic stools at home over a few days.    Clinical Impression   PTA, pt lived alone and was independent with ADL and mobility. Pt completed ADL with S, however required mod verbal cues to complete a bathing task. Cognition appears impaired - unsure of baseline as no family available.Pt will need 24/7 S initially when discharging home due to apparent cognitive impairments. Recommend [place] and [place] social work.     Follow Up Recommendations  Home health OT;Supervision/Assistance - 24 hour    Equipment Recommendations  None recommended by OT    Recommendations for Other Services       Precautions / Restrictions Precautions Precautions: Fall Restrictions Weight Bearing Restrictions: No      Mobility Bed Mobility Overal bed mobility: Independent                Transfers Overall transfer level: Needs assistance     Sit to Stand: Supervision         General transfer comment: Attempted to use RW. Pt stated I don't use that thing at home.    Balance     Sitting balance-Leahy Scale: Good       Standing balance-Leahy Scale: Fair                              ADL Overall ADL's : Needs assistance/impaired     Grooming: Set up;Standing   Upper Body Bathing: Supervision/ safety;Standing   Lower Body Bathing: Supervison/ safety;Sit to/from stand   Upper Body Dressing : Supervision/safety;Standing   Lower Body Dressing:  Supervision/safety;Set up;Sit to/from stand   Toilet Transfer: Supervision/safety;Ambulation;Comfort height toilet   Toileting- Clothing Manipulation and Hygiene: Supervision/safety;Sit to/from stand       Functional mobility during ADLs: Supervision/safety General ADL Comments: Pt with disheveled appearance. Instructed pt that he was going to the sink to bath. Pt only washed his hands. instructed pt again that he needed to bath, pt only wiped his underarms with a dry washcloth. REquired mod vc to sequence the task and complete thouroughly. Pt put soap on his body and required cues to wash it off.     Vision     Perception     Praxis      Pertinent Vitals/Pain Pain Assessment: No/denies pain     Hand Dominance     Extremity/Trunk Assessment Upper Extremity Assessment Upper Extremity Assessment: Generalized weakness   Lower Extremity Assessment Lower Extremity Assessment: Defer to PT evaluation   Cervical / Trunk Assessment Cervical / Trunk Assessment: Normal   Communication Communication Communication: No difficulties   Cognition Arousal/Alertness: Awake/alert Behavior During Therapy: Flat affect Overall Cognitive Status: No family/caregiver present to determine baseline cognitive functioning (appears impaired)  Poor awareness - emergent awareness Cognition needs to be further assessed                     General Comments  Exercises       Shoulder Instructions      Home Living Family/patient expects to be discharged to:: Private residence Living Arrangements: Alone Available Help at Discharge: Family;Available 24 hours/day (says daughter can stay 24 hours initially) Type of Home: House Home Access: Stairs to enter [employer] of Steps: 3 Entrance Stairs-Rails: None Home Layout: One level     Bathroom Shower/Tub: Occupational psychologist: Standard     Home Equipment: Environmental consultant - 2 wheels          Prior  Functioning/Environment Level of Independence: Independent        Comments: Recently got a RW when he left [employer] but he had not gotten a chance to use it.      OT Diagnosis: Generalized weakness;Cognitive deficits   OT Problem List: Decreased strength;Decreased activity tolerance;Decreased cognition;Decreased knowledge of use of DME or AE;Obesity   OT Treatment/Interventions: Self-care/ADL training;Therapeutic exercise;DME and/or AE instruction;Energy conservation;Therapeutic activities;Cognitive remediation/compensation;Patient/family education    OT Goals(Current goals can be found in the care plan section) Acute Rehab OT Goals Patient Stated Goal: to go home OT Goal Formulation: With patient Time For Goal Achievement: 06/02/15 Potential to Achieve Goals: Good  OT Frequency: Min 2X/week   Barriers to D/C: Decreased caregiver support          Co-evaluation              End of Session Equipment Utilized During Treatment: Gait belt Nurse Communication: Mobility status  Activity Tolerance: Patient tolerated treatment well Patient left: in bed;with call bell/phone within reach;with bed alarm set   Time: [id number] OT Time Calculation (min): 24 min Charges:  OT General Charges $OT Visit: 1 Procedure OT Evaluation $OT Eval Moderate Complexity: 1 Procedure OT Treatments $Self Care/Home Management : 8-22 mins G-Codes:    Dustin Estes,Dustin Estes 2015-06-09, 3:41 PM   [facility], OTR/L  [phone] Jun 09, 2015

## 2015-05-27 LAB — CBC
HCT: 27.4 % — ABNORMAL LOW (ref 39.0–52.0)
Hemoglobin: 8.8 g/dL — ABNORMAL LOW (ref 13.0–17.0)
MCH: 29.6 pg (ref 26.0–34.0)
MCHC: 32.1 g/dL (ref 30.0–36.0)
MCV: 92.3 fL (ref 78.0–100.0)
PLATELETS: 174 10*3/uL (ref 150–400)
RBC: 2.97 MIL/uL — ABNORMAL LOW (ref 4.22–5.81)
RDW: 14.8 % (ref 11.5–15.5)
WBC: 6.8 10*3/uL (ref 4.0–10.5)

## 2015-05-27 LAB — GLUCOSE, CAPILLARY
GLUCOSE-CAPILLARY: 134 mg/dL — AB (ref 65–99)
Glucose-Capillary: 111 mg/dL — ABNORMAL HIGH (ref 65–99)

## 2015-05-27 MED ORDER — CYANOCOBALAMIN 1000 MCG/ML IJ SOLN
1000.0000 ug | Freq: Every day | INTRAMUSCULAR | Status: DC
  Administered 2015-05-27 – 2015-06-01 (×6): 1000 ug via SUBCUTANEOUS
  Filled 2015-05-27 (×6): qty 1

## 2015-05-27 MED ORDER — METOPROLOL TARTRATE 12.5 MG HALF TABLET
12.5000 mg | ORAL_TABLET | Freq: Two times a day (BID) | ORAL | Status: DC
  Administered 2015-05-27 – 2015-05-31 (×10): 12.5 mg via ORAL
  Filled 2015-05-27 (×11): qty 1

## 2015-05-27 MED ORDER — SIMVASTATIN 40 MG PO TABS
40.0000 mg | ORAL_TABLET | Freq: Every day | ORAL | Status: DC
  Administered 2015-05-27 – 2015-05-31 (×5): 40 mg via ORAL
  Filled 2015-05-27 (×5): qty 1

## 2015-05-27 NOTE — Care Management Note (Signed)
Case Management Note  Patient Details  Name: Moath Lum. MRN: [id number] Date of Birth: [birth year]  Subjective/Objective:   Pt lives alone, CM noted that PT and OT recommended 24-hr supervision.  Talked with pt and he stated that his dtr would stay with him, requested CM call dtr to arrange.  Dtr states she cannot stay with pt at his home and he cannot stay at her home.  Relayed that to pt who then stated he could stay with his brother.  CM called brother who responded that pt could not stay @ his home - both dtr and brother think rehab @ SNF is best option.  Discussed with pt who now agrees that he needs to go to SNF for rehab.  Will consult CSW.                    Expected Discharge Plan:  Skilled Nursing Facility  In-House Referral:  Clinical Social Work  Discharge planning Services  CM Consult  Status of Service:  Completed, signed off  Medicare Important Message Given:  Yes  Girard Cooter, RN 05/27/2015, 10:42 AM

## 2015-05-27 NOTE — Clinical Social Work Note (Signed)
Clinical Social Work Assessment  Patient Details  Name: Dustin Estes. MRN: [id number] Date of Birth: Aug 25, 1950  Date of referral:  05/27/15               Reason for consult:  Facility Placement                Permission sought to share information with:  Family Supports Permission granted to share information::  No (pt DO)  Name::     Soil scientist::  NA  Relationship::  daughter  Contact Information:     Housing/Transportation Living arrangements for the past 2 months:  Apartment (rents from his brother Dustin Estes)) Source of Information:  Adult Children Patient Interpreter Needed:  None Criminal Activity/Legal Involvement Pertinent to Current Situation/Hospitalization:  No - Comment as needed Significant Relationships:  Adult Children, Siblings Lives with:  Self Do you feel safe going back to the place where you live?  Yes (pt reports feeling safe but family does not feel as if it is safe to return home) Need for family participation in patient care:  Yes (Comment) (decision making)  Care giving concerns:  Pt lives at home alone in an apartment he rents from his brother.  Patient has daughter and brother who live locally and check in regularly but are not available 24 hours a day.  Major concerns about patient safety at home- pt spends SS check within a week of receiving, falls asleep with lit cigarettes, no reliable to purchase food/necessities.   Social Worker assessment / plan:  CSW spoke with pt dtr regarding current situation.  Patient had head injury in 2011 after a fall at work and then another in 2012- forced to retire due to worsening cognition.  Pt dtr states over the past year he has gotten much worse and is not safe at home.  CSW discussed that pt is not appropriate candidate for SNF at this time- no skilled needs.  CSW discussed need for Medicaid application with dtr for possible long term placement if patient does not have capacity and will need longer term legal  guardianship.  CSW informed of need for [place] to make APS report and explained that APS could be ally for family in making decisions.  Employment status:  Retired Forensic scientist:    PT Recommendations:  Home with [place], [place] / Referral to community resources:  APS (Comment Required: [place], Name & Number of worker spoken with)  Patient/Family's Response to care:  Pt daughter is agreeable to current plan to await psych determination and work on placement from there.  Patient/Family's Understanding of and Emotional Response to Diagnosis, Current Treatment, and Prognosis: unclear- family seems to understand pt competency is low and realistic about prognosis.  Emotional Assessment Appearance:  Appears stated age Attitude/Demeanor/Rapport:  Unable to Assess Affect (typically observed):  Unable to Assess Orientation:  Oriented to Self, Oriented to Place, Oriented to  Time Alcohol / Substance use:  Not Applicable Psych involvement (Current and /or in the community):  Yes (Comment) (plan to assess for competency)  Discharge Needs  Concerns to be addressed:  Care Coordination, Home Safety Concerns Readmission within the last 30 days:    Current discharge risk:  Cognitively Impaired Barriers to Discharge:  Unsafe home situation   Dustin Mon, LCSW 05/27/2015, 4:58 PM

## 2015-05-27 NOTE — Progress Notes (Signed)
[place] TEAM 1 - Stepdown/ICU TEAM PROGRESS NOTE  Dustin Estes. [id number] DOB: December 09, 1950 DOA: 05/21/2015 PCP: [name], MD  Admit HPI / Brief Narrative: 65 y.o. male who presented to the ED w/ altered mental state.  He was reportedly at home with family when he suddenly became weak and slumped over and refused to respond to family members or get up. Patient refused to come to the emergency room therefore the daughter went to the magistrate and obtained paperwork authorizing her to force him into the hospital. Family reported malonic stools at home over a few days.   HPI/Subjective: The patient has no new complaints today.  He denies fevers chills shortness of breath nausea vomiting or abdominal pain.  He denies hematochezia or melena.  Physical therapy and occupational therapy have worked with the patient and have raised concern that he does not appear to be cognitively intact enough to live independently.  They have both suggested that the patient requires 24-hour supervision.  Case management has contacted the patient's nearest relatives who also stated they are not able to provide this care for him.  The patient himself does not feel that placement within skilled nursing facility is something he is interested in.  Assessment/Plan:  Acute encephalopathy Mental status appears to be at baseline presently - B12 being replaced - there is concern that the pt is not competent to make appropriate decisions (family reportedly had to petition the magistrate to get him to the hospital for this admit) - PT/OT suggest 24hr supervision, but this is not available at his home, to which he wishes to be d/c - will have to ask Psych to determine if he is competent to make his own decisions  - if he is, he could be d/c home as soon as 2/9 - if he is not we will have to consider other options for safe d/c from the hospital   UGIB due to large duodenal ulcer - ulcer in gastric antrum - erosive  gastritis  IV protonix utilized to complete 72hrs of tx at which time we transitioned to oral therapy - tx w/ clips, epi, and cautery at time of EGD - if re-bleeds will need IR angio/embolization - BUN has normalized, and pt has become hemodynamically stable - tolerating diet  Anemia of acute blood loss - severe Fe deficiency  Hgb 7.6 on presentation - s/p 2U PRBC - Hgb appears to be stabilizing/climbing - loaded w/ IV Fe - do not presently suspect ongoing blood loss   Severe B12 deficiency loading w/ daily SQ injections - transition to oral at time of d/c w/ f/u level in 6-8 weeks to determine if ongoing SQ/IM dosing required   Acute kidney injury Resolved w/ volume resuscitation   History of HTN not an active problem at the present time  DM2 CBG is controlled - no change in tx plan today   HLD Resume tx now that diet resumed   Code Status: FULL Family Communication: no family present at time of exam Disposition Plan: stable for transfer to med bed - disposition challenges as detailed above in HPI  Consultants: GI at [facility]  Procedures: 2/3 TTE - ejection fraction 55-60% - no wall motion abnormalities  - grade 1 diastolic dysfunction  2/3 EGD - large duodenal ulcer - ulcer in gastric antrum - erosive gastritis   Antibiotics: none  DVT prophylaxis: SCDs  Objective: Blood pressure 117/97, pulse 85, temperature 98.1 F (36.7 C), temperature source Oral, resp. rate 25,  height 5\' 9"  (1.753 m), weight 81.4 kg (179 lb 7.3 oz), SpO2 96 %.  Intake/Output Summary (Last 24 hours) at 05/27/15 1507 Last data filed at 05/27/15 1220  Gross per 24 hour  Intake    990 ml  Output   2325 ml  Net  -1335 ml   Exam: General: No acute respiratory distress Lungs: Clear to auscultation bilaterally  Cardiovascular: Regular rate and rhythm without murmur  Abdomen: Nontender, nondistended, soft, bowel sounds positive, no rebound Extremities: No significant cyanosis, clubbing, edema bilateral  lower extremities  Data Reviewed:  Basic Metabolic Panel:  Recent Labs Lab 05/21/15 1838 05/22/15 0602 05/23/15 0340 05/24/15 0335 05/25/15 0525  NA 135 136 140 139 138  K 3.8 3.8 4.1 3.6 3.6  CL 99* 101 109 107 104  CO2 28 30 23 27 28   GLUCOSE 196* 156* 173* 107* 115*  BUN 62* 39* 23* 11 6  CREATININE 1.21 1.11 0.89 0.87 0.87  CALCIUM 7.8* 7.9* 6.8* 7.2* 7.8*    CBC:  Recent Labs Lab 05/21/15 1838  05/23/15 1502  05/24/15 1835 05/25/15 0525 05/25/15 1816 05/26/15 0430 05/27/15 0829  WBC 11.6*  < > 10.4  < > 8.5 7.5 6.7 6.3 6.8  NEUTROABS 8.4*  --  7.5  --   --   --   --   --   --   HGB 7.6*  < > 8.7*  < > 9.4* 8.8* 8.8* 8.3* 8.8*  HCT 22.5*  < > 24.9*  < > 27.0* 25.7* 26.8* 25.2* 27.4*  MCV 96.6  < > 90.2  < > 90.9 91.1 91.8 91.0 92.3  PLT 223  < > 106*  < > 115* 111* 132* 127* 174  < > = values in this interval not displayed.  Liver Function Tests:  Recent Labs Lab 05/21/15 1838 05/22/15 0602 05/24/15 0335 05/25/15 0525  AST 62* 39 17 23  ALT 45 39 19 21  ALKPHOS 68 50 35* 47  BILITOT 0.4 0.9 0.5 0.7  PROT 5.4* 5.7* 3.6* 4.5*  ALBUMIN 2.5* 2.7* 1.9* 2.1*    Recent Labs Lab 05/21/15 1838 05/25/15 0525  LIPASE 59* 41    Recent Labs Lab 05/21/15 1838  AMMONIA 20    Coags:  Recent Labs Lab 05/22/15 0602  INR 1.27    Recent Labs Lab 05/22/15 0602  APTT 28    Cardiac Enzymes:  Recent Labs Lab 05/21/15 1838  TROPONINI <0.03    CBG:  Recent Labs Lab 05/26/15 1225 05/26/15 1730 05/26/15 2138 05/27/15 0801 05/27/15 1259  GLUCAP 120* 230* 181* 134* 111*    Recent Results (from the past 240 hour(s))  Culture, blood (routine x 2)     Status: None   Collection Time: 05/21/15 11:15 PM  Result Value Ref Range Status   Specimen Description BLOOD LEFT ARM  Final   Special Requests BOTTLES DRAWN AEROBIC AND ANAEROBIC [facility] EACH  Final   Culture NO GROWTH 5 DAYS  Final   Report Status 05/26/2015 FINAL  Final  Culture, blood  (routine x 2)     Status: None   Collection Time: 05/21/15 11:18 PM  Result Value Ref Range Status   Specimen Description BLOOD RIGHT ANTECUBITAL  Final   Special Requests BOTTLES DRAWN AEROBIC AND ANAEROBIC 10CC EACH  Final   Culture NO GROWTH 5 DAYS  Final   Report Status 05/26/2015 FINAL  Final  MRSA PCR Screening     Status: None   Collection Time: 05/22/15  9:11 PM  Result Value Ref Range Status   MRSA by PCR NEGATIVE NEGATIVE Final    Comment:        The GeneXpert MRSA Assay (FDA approved for NASAL specimens only), is one component of a comprehensive MRSA colonization surveillance program. It is not intended to diagnose MRSA infection nor to guide or monitor treatment for MRSA infections.      Studies:   Recent x-ray studies have been reviewed in detail by the Attending Physician  Scheduled Meds:  Scheduled Meds: . insulin aspart  0-5 Units Subcutaneous QHS  . insulin aspart  0-9 Units Subcutaneous TID WC  . nicotine  21 mg Transdermal Daily  . pantoprazole  40 mg Oral BID  . vitamin B-12  500 mcg Oral Daily    Time spent on care of this patient: 25 mins   [facility] T , MD   [facility] Office  [phone] Pager - Text Page per Shea Evans as per below:  On-Call/Text Page:      [email or web address]      password [email or web address]  If 7PM-7AM, please contact night-coverage [email or web address] Password [email or web address] 05/27/2015, 3:07 PM   LOS: 6 days

## 2015-05-28 LAB — GLUCOSE, CAPILLARY
GLUCOSE-CAPILLARY: 116 mg/dL — AB (ref 65–99)
GLUCOSE-CAPILLARY: 149 mg/dL — AB (ref 65–99)
Glucose-Capillary: 136 mg/dL — ABNORMAL HIGH (ref 65–99)
Glucose-Capillary: 170 mg/dL — ABNORMAL HIGH (ref 65–99)
Glucose-Capillary: 172 mg/dL — ABNORMAL HIGH (ref 65–99)
Glucose-Capillary: 219 mg/dL — ABNORMAL HIGH (ref 65–99)

## 2015-05-28 NOTE — Progress Notes (Signed)
CSW made APS report for home concerns.  Patient is not candidate for short term SNF stay since there are no skilled needs and placement would only be for concerns with mental status.  CSW has spoken to daughter and explained that SNF is not an option at this time and they need to start working towards longer term plan- encouraged them to begin Medicaid application.  Pt transferred to 6N- report given to unit CSW  Domenica Reamer, [place] Social Worker [phone]

## 2015-05-28 NOTE — Progress Notes (Signed)
Occupational Therapy Treatment Patient Details Name: Dustin Estes. MRN: [id number] DOB: 02-15-1951 Today's Date: 05/28/2015    History of present illness 65 y.o. male who presented to the ED w/ altered mental state. He was reportedly at home with family when he suddenly became weak and slumped over and refused to respond to family members or get up. Patient refused to come to the emergency room therefore the daughter went to the magistrate and obtained paperwork authorizing her to force him into the hospital. Family reported malonic stools at home over a few days.    OT comments  Pt seen to further assess cognition through ADL tasks. Pt NOT safe to D/C home without 24/7 S. Pt unable to complete simple, basic ADL tasks, such as completing pericare after having a BM without mod verbal cuing to complete the tasks. Pt requires moderate verbal cues to sequence tasks, such as using soap when bathing and actually washing soap off. Pt has difficulty maintaining attention to task and apparent STM deficits. Given the amount of difficulty completing basic self care, pt is at severe safety risk with IADL tasks and is NOT safe to live alone at this time.   Follow Up Recommendations  Home health OT;Supervision/Assistance - 24 hour    Equipment Recommendations  None recommended by OT    Recommendations for Other Services      Precautions / Restrictions Precautions Precautions: Fall       Mobility Bed Mobility Overal bed mobility: Modified Independent                Transfers Overall transfer level: Needs assistance Equipment used: None Transfers: Sit to/from Stand;Stand Pivot Transfers Sit to Stand: Supervision         General transfer comment: Pt refused to use RW    Balance Overall balance assessment: Needs assistance           Standing balance-Leahy Scale: Fair                     ADL Overall ADL's : Needs assistance/impaired                                      Functional mobility during ADLs: Supervision/safety General ADL Comments: Pt initially declining to get OOB to cmoplete ADL even though he was urine soaked from "spilling the urinal on himself". Pt abulated to the bathroom with min guard assist. Pt given instructions to go to the sinnk to wash his peri area and brush his teeth after using the bathroom. Pt had a BM and did not complete any pericare and began to walk back to bed. Required mod vc to sequence task for LB bathing/pericare.       Vision                     Perception     Praxis      Cognition   Behavior During Therapy: Flat affect Overall Cognitive Status: No family/caregiver present to determine baseline cognitive functioning (apparently impaired)                       Extremity/Trunk Assessment               Exercises     Shoulder Instructions       General Comments      Pertinent Vitals/ Pain  Pain Assessment: No/denies pain  Home Living                                          Prior Functioning/Environment              Frequency Min 2X/week     Progress Toward Goals  OT Goals(current goals can now be found in the care plan section)  Progress towards OT goals: Progressing toward goals  Acute Rehab OT Goals Patient Stated Goal: to go home OT Goal Formulation: With patient Time For Goal Achievement: 06/02/15 Potential to Achieve Goals: Good ADL Goals Pt Will Perform Lower Body Bathing: with modified independence;sit to/from stand Pt Will Perform Lower Body Dressing: with modified independence;sit to/from stand Additional ADL Goal #1: Demonstrate anticipatory awareness during ADL task  Plan Discharge plan remains appropriate    Co-evaluation                 End of Session Equipment Utilized During Treatment: Gait belt   Activity Tolerance Patient tolerated treatment well   Patient Left in chair;with call bell/phone  within reach;with chair alarm set   Nurse Communication Mobility status;Other (comment) (D/C concerns)        Time: 1415-1440 OT Time Calculation (min): 25 min  Charges: OT General Charges $OT Visit: 1 Procedure OT Treatments $Self Care/Home Management : 23-37 mins  Aminah Zabawa,[name] 05/28/2015, 2:49 PM   [facility], OTR/L  [phone] 05/28/2015

## 2015-05-28 NOTE — Care Management Important Message (Signed)
Important Message  Patient Details  Name: Dustin Estes. MRN: [id number] Date of Birth: 11-Jul-1950   Medicare Important Message Given:  Yes    Loann Quill 05/28/2015, 8:38 AM

## 2015-05-28 NOTE — Progress Notes (Signed)
[place] TEAM 1 - Stepdown/ICU TEAM Progress Note  Dustin Estes. BY:[id number] DOB: June 18, 1950 DOA: 05/21/2015 PCP: [name], MD  Admit HPI / Brief Narrative: 65 y.o. WM PMHx tobacco abuse, HTN, Diabetes Type 2 controlled,  Presented to the ED w/ altered mental state. He was reportedly at home with family when he suddenly became weak and slumped over and refused to respond to family members or get up. Patient refused to come to the emergency room therefore the daughter went to the magistrate and obtained paperwork authorizing her to force him into the hospital. Family reported malonic stools at home over a few days.  During his hospitalization patient received 2 units PRBC secondary to GI bleed. Patient was seen by GI and received EGD which revealed large duodenal ulcer - ulcer in gastric antrum - erosive gastritis; directed submucosal injection: EPINEPHRINE.  HPI/Subjective: 2/9 A/O 4, NAD. Patient states wants to be discharged, does not want SNF or help at home. States mild DOE  Assessment/Plan: Acute encephalopathy -Resolved  -Spoke with NCM Blain Pais will inform me that yesterday a daughter thought that patient was not competent and therefore a psychiatry consult was placed. Awaiting psychiatry to evaluate for competency  UGIB due to large duodenal ulcer - ulcer in gastric antrum - erosive gastritis  -Protonix 40 mg BID -Schedule follow-up appointment for 1 month with Dr. Marga Melnick Fields upper GI bleed  Anemia of acute blood loss - severe Fe deficiency  -Hgb 7.6 on presentation - s/p 2U PRBC, hemoglobin stabilizing around 8.5  -Protonix 40 mg BID -Follow-up with Dr. Juanita Craver 1-2 weeks for upper GI bleed, acute kidney injury, HTN  Severe B12 deficiency -loading w/ daily SQ injections  - Vitamin B 12 500 g daily f/u level in 6-8 weeks with PCP to determine if ongoing SQ/IM dosing required   Acute kidney injury -Resolved w/ volume resuscitation    HTN -not an active problem at the present time -PCP to determine if/when patient needs to start HTN medication  DM Type 2 controlled -2/2 hemoglobin A1c =5.7 -CBG is controlled - no change in tx plan today   HLD -Fenofibrate 160 mg QHS -Zocor 40 mg daily     Code Status: FULL Family Communication: no family present at time of exam Disposition Plan: SNF??    Procedures: 2/3 TTE - ejection fraction 55-60% - no wall motion abnormalities - grade 1 diastolic dysfunction  2/3 EGD - large duodenal ulcer - ulcer in gastric antrum - erosive gastritis; directed submucosal injection: EPINEPHRINE    Consultations: Dr.Sandi L Fields GI   DVT prophylaxis: SCD   Devices NA   LINES / TUBES:  NA    Continuous Infusions:   Objective: VITAL SIGNS: Temp: 98.3 F (36.8 C) (02/09 1410) Temp Source: Oral (02/09 1410) BP: 106/68 mmHg (02/09 1410) Pulse Rate: 81 (02/09 1410) SPO2; FIO2:   Intake/Output Summary (Last 24 hours) at 05/28/15 1615 Last data filed at 05/28/15 1411  Gross per 24 hour  Intake    600 ml  Output   2500 ml  Net  -1900 ml     Exam: General: A/O 4, NAD, No acute respiratory distress Eyes: Negative headache, negative scleral hemorrhage ENT: Negative Runny nose, negative gingival bleeding, Neck:  Negative scars, masses, torticollis, lymphadenopathy, JVD Lungs: Clear to auscultation bilaterally, , mild diffuse wheezing, negative crackles Cardiovascular: Regular rate and rhythm without murmur gallop or rub normal S1 and S2 Abdomen:negative abdominal pain, nondistended, positive soft, bowel sounds, no rebound,  no ascites, no appreciable mass Extremities: No significant cyanosis, clubbing, or edema bilateral lower extremities Psychiatric:  Negative depression, negative anxiety, negative fatigue, negative mania  Neurologic:  Cranial nerves II through XII intact, tongue/uvula midline, all extremities muscle strength 5/5, sensation intact  throughout,  negative dysarthria, negative expressive aphasia, negative receptive aphasia.   Data Reviewed: Basic Metabolic Panel:  Recent Labs Lab 05/21/15 1838 05/22/15 0602 05/23/15 0340 05/24/15 0335 05/25/15 0525  NA 135 136 140 139 138  K 3.8 3.8 4.1 3.6 3.6  CL 99* 101 109 107 104  CO2 28 30 23 27 28   GLUCOSE 196* 156* 173* 107* 115*  BUN 62* 39* 23* 11 6  CREATININE 1.21 1.11 0.89 0.87 0.87  CALCIUM 7.8* 7.9* 6.8* 7.2* 7.8*   Liver Function Tests:  Recent Labs Lab 05/21/15 1838 05/22/15 0602 05/24/15 0335 05/25/15 0525  AST 62* 39 17 23  ALT 45 39 19 21  ALKPHOS 68 50 35* 47  BILITOT 0.4 0.9 0.5 0.7  PROT 5.4* 5.7* 3.6* 4.5*  ALBUMIN 2.5* 2.7* 1.9* 2.1*    Recent Labs Lab 05/21/15 1838 05/25/15 0525  LIPASE 59* 41    Recent Labs Lab 05/21/15 1838  AMMONIA 20   CBC:  Recent Labs Lab 05/21/15 1838  05/23/15 1502  05/24/15 1835 05/25/15 0525 05/25/15 1816 05/26/15 0430 05/27/15 0829  WBC 11.6*  < > 10.4  < > 8.5 7.5 6.7 6.3 6.8  NEUTROABS 8.4*  --  7.5  --   --   --   --   --   --   HGB 7.6*  < > 8.7*  < > 9.4* 8.8* 8.8* 8.3* 8.8*  HCT 22.5*  < > 24.9*  < > 27.0* 25.7* 26.8* 25.2* 27.4*  MCV 96.6  < > 90.2  < > 90.9 91.1 91.8 91.0 92.3  PLT 223  < > 106*  < > 115* 111* 132* 127* 174  < > = values in this interval not displayed. Cardiac Enzymes:  Recent Labs Lab 05/21/15 1838  TROPONINI <0.03   BNP (last 3 results) No results for input(s): BNP in the last 8760 hours.  ProBNP (last 3 results) No results for input(s): PROBNP in the last 8760 hours.  CBG:  Recent Labs Lab 05/26/15 2138 05/27/15 0801 05/27/15 1259 05/28/15 0724 05/28/15 1141  GLUCAP 181* 134* 111* 116* 149*    Recent Results (from the past 240 hour(s))  Culture, blood (routine x 2)     Status: None   Collection Time: 05/21/15 11:15 PM  Result Value Ref Range Status   Specimen Description BLOOD LEFT ARM  Final   Special Requests BOTTLES DRAWN AEROBIC AND  ANAEROBIC 6CC EACH  Final   Culture NO GROWTH 5 DAYS  Final   Report Status 05/26/2015 FINAL  Final  Culture, blood (routine x 2)     Status: None   Collection Time: 05/21/15 11:18 PM  Result Value Ref Range Status   Specimen Description BLOOD RIGHT ANTECUBITAL  Final   Special Requests BOTTLES DRAWN AEROBIC AND ANAEROBIC 10CC EACH  Final   Culture NO GROWTH 5 DAYS  Final   Report Status 05/26/2015 FINAL  Final  MRSA PCR Screening     Status: None   Collection Time: 05/22/15  9:11 PM  Result Value Ref Range Status   MRSA by PCR NEGATIVE NEGATIVE Final    Comment:        The GeneXpert MRSA Assay (FDA approved for NASAL specimens only), is  one component of a comprehensive MRSA colonization surveillance program. It is not intended to diagnose MRSA infection nor to guide or monitor treatment for MRSA infections.      Studies:  Recent x-ray studies have been reviewed in detail by the Attending Physician  Scheduled Meds:  Scheduled Meds: . cyanocobalamin  1,000 mcg Subcutaneous Daily  . insulin aspart  0-5 Units Subcutaneous QHS  . insulin aspart  0-9 Units Subcutaneous TID WC  . metoprolol tartrate  12.5 mg Oral BID  . nicotine  21 mg Transdermal Daily  . pantoprazole  40 mg Oral BID  . simvastatin  40 mg Oral QHS    Time spent on care of this patient: 40 mins   Breanna Shorkey, Geraldo Docker , MD  [facility] Office  [phone] Pager [phone]  On-Call/Text Page:      [email or web address]      password [email or web address]  If 7PM-7AM, please contact night-coverage [email or web address] Password [email or web address] 05/28/2015, 4:15 PM   LOS: 7 days   Care during the described time interval was provided by me .  I have reviewed this patient's available data, including medical history, events of note, physical examination, and all test results as part of my evaluation. I have personally reviewed and interpreted all radiology studies.   Dia Crawford, MD [phone] Pager

## 2015-05-28 NOTE — Progress Notes (Signed)
Physical Therapy Treatment Patient Details Name: Dustin Estes. MRN: [id number] DOB: 10-11-1950 Today's Date: 05/28/2015    History of Present Illness 65 y.o. male admitted to [facility] on 05/21/15 for AMS.  Pt dx with acute encephalopathy, UGIB, ABL anemia, and severe B12 deficiency.  Family had to petition the magistrate to force pt to come to the hospital because he was refusing to come per chart.  Pt with significant PMHx of DM, 2nd degree heart block, PAD, AAA, HTN, brain surgery, and pacemaker.    PT Comments    Pt is progressing well with his mobility.  He is mildly unsteady on his feet and has some balance checks with normal level gait.  Pt still feels he is not quite back up to his normal functioning yet.  He is adamantly refusing the idea of SNF for rehab at this time.    Follow Up Recommendations  Home health PT;Supervision/Assistance - 24 hour     Equipment Recommendations  None recommended by PT    Recommendations for Other Services   NA     Precautions / Restrictions Precautions Precautions: Fall Precaution Comments: pt mildly unsteady on his feet    Mobility  Bed Mobility Overal bed mobility: Modified Independent             General bed mobility comments: uses bed rails for leverage at trunk.  Sat up from flat bed.  Transfers Overall transfer level: Needs assistance Equipment used: None Transfers: Sit to/from Stand Sit to Stand: Supervision         General transfer comment: supervision for safety due to reliance on hands for support during transitions.   Ambulation/Gait Ambulation/Gait assistance: Supervision;Min guard Ambulation Distance (Feet): 400 Feet Assistive device: None Gait Pattern/deviations: Step-through pattern;Staggering left;Staggering right Gait velocity: decreased   General Gait Details: up to min guard assist for small LOB during gait when challenged (see DGI).  Pt reports he does not quite feel up to his baseline, but doesn't want to  use the walker.    Stairs Stairs: Yes Stairs assistance: Supervision Stair Management: One rail Right;One rail Left;Step to pattern;Forwards;Alternating pattern Number of Stairs: 9 General stair comments: alternating up, step to down reliance on rail for balance.          Balance Overall balance assessment: Needs assistance Sitting-balance support: Feet supported;No upper extremity supported Sitting balance-Leahy Scale: Good     Standing balance support: Bilateral upper extremity supported;No upper extremity supported Standing balance-Leahy Scale: Good                      Cognition Arousal/Alertness: Awake/alert Behavior During Therapy: WFL for tasks assessed/performed Overall Cognitive Status: No family/caregiver present to determine baseline cognitive functioning (from what I have been reading he has a h/o cognitive deficit)                             Pertinent Vitals/Pain Pain Assessment: No/denies pain           PT Goals (current goals can now be found in the care plan section) Acute Rehab PT Goals Patient Stated Goal: to go home Progress towards PT goals: Progressing toward goals    Frequency  Min 3X/week    PT Plan Current plan remains appropriate       End of Session Equipment Utilized During Treatment: Gait belt Activity Tolerance: Patient limited by fatigue;Other (comment) (limited by DOE, HR 110s and O2 sats  mid 90s) Patient left: in chair;with call bell/phone within reach;with chair alarm set     Time: [phone] PT Time Calculation (min) (ACUTE ONLY): 30 min  Charges:  $Gait Training: 8-22 mins $Physical Performance Test: 8-22 mins                      Rowyn Spilde B. [place], [place], DPT [phone]   05/28/2015, 3:54 PM

## 2015-05-29 ENCOUNTER — Encounter (HOSPITAL_COMMUNITY): Payer: Self-pay | Admitting: Internal Medicine

## 2015-05-29 DIAGNOSIS — G4733 Obstructive sleep apnea (adult) (pediatric): Secondary | ICD-10-CM

## 2015-05-29 LAB — GLUCOSE, CAPILLARY
GLUCOSE-CAPILLARY: 234 mg/dL — AB (ref 65–99)
Glucose-Capillary: 150 mg/dL — ABNORMAL HIGH (ref 65–99)
Glucose-Capillary: 106 mg/dL — ABNORMAL HIGH (ref 65–99)
Glucose-Capillary: 207 mg/dL — ABNORMAL HIGH (ref 65–99)

## 2015-05-29 NOTE — Progress Notes (Signed)
Progress Note   Dustin Estes. HC:[id number] DOB: [birth year] DOA: 05/21/2015 PCP: [name], MD   Brief Narrative:   Dustin Estes. is an 65 y.o. male with a PMH of tobacco abuse, HTN, Diabetes Type 2 controlled, who was admitted 05/21/15 with altered mental state. He was reportedly at home with family when he suddenly became weak and slumped over and refused to respond to family members or get up. Patient refused to come to the emergency room therefore the daughter went to the magistrate and obtained paperwork authorizing her to force him into the hospital. Family reported malonic stools at home over a few days. During his hospitalization patient received 2 units PRBC secondary to GI bleed. Patient was seen by GI and received EGD which revealed large duodenal ulcer - ulcer in gastric antrum - erosive gastritis; directed submucosal injection: EPINEPHRINE.  Assessment/Plan:   Principle Problem: UGIB due to large duodenal ulcer - ulcer in gastric antrum - erosive gastritis  - Continue Protonix 40 mg BID. - Schedule follow-up appointment for 1 month with Dr. Marga Melnick Fields upper GI bleed.  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  - Psychiatry consultation for competency requested per notes. Apparently this was not called in yesterday.  - Called in today, but too late in the day to arrange for consult today.  Anemia of acute blood loss - severe Fe deficiency with near syncope - Hgb 7.6 on presentation - s/p 2U PRBC, hemoglobin stable at 8.8 today.  - Protonix 40 mg BID. - Follow-up with Dr. Juanita Craver 1-2 weeks for upper GI bleed, acute kidney injury, HTN.  Severe B12 deficiency - Subcutaneous supplementation provided.  - Vitamin B 12 500 g daily f/u level in 6-8 weeks with PCP to determine if ongoing SQ/IM dosing required.   Acute kidney injury - Resolved w/ volume  resuscitation & holding ACE-I.  HTN - Does not appear to be an active problem at the present time. - PCP to determine if/when patient needs to start HTN medication.  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  Currently being managed with insulin sensitive SSI. - CBG 106-219.  Oral hypoglycemics on hold.  HLD - Fenofibrate 160 mg QHS. - Zocor 40 mg daily.  OSA - Non-compliant with CPAP.  PAF / s/p Pacemaker - Continue ASA.  Cardizem D/C'd. Now on lopressor.  Tobacco Abuse - Continue nicotine patch.  Counseled.  Family Communication/Anticipated D/C date and plan/Code Status   Family Communication: No family present at the bedside. Disposition Plan: Awaiting competency evaluation. 24 hour supervision recommended by PT.   Code Status: Full code.   IV Access:    Peripheral IV   Procedures and diagnostic studies:   Ct Head Wo Contrast  05/21/2015  CLINICAL DATA:  EMS reports pt drank 9 raw eggs this morning for unknown reason. Pt's family reported to EMS that pt is usually able to ambulate but hasn't been able to lately and fell to the floor today and was unable to get up. Pt's family also reported to EMS that pt hasn't ate or drank for several weeks. EXAM: CT HEAD WITHOUT CONTRAST TECHNIQUE: Contiguous axial images were obtained from the base of the skull through the vertex without intravenous contrast. COMPARISON:  03/24/2010 FINDINGS: Ventricles are normal configuration. There is ventricular and sulcal enlargement reflecting mild atrophy. No hydrocephalus. There are no parenchymal masses or mass effect. Encephalomalacia  along the lateral left frontal lobe is from a previous hemorrhage. There is an overlying craniotomy flap. There is hypoattenuation consistent with additional encephalomalacia along the anterior, inferior frontal lobes bilaterally, likely from old trauma. There is no evidence of a recent cortical infarct. There are no extra-axial masses or  abnormal fluid collections. There is no intracranial hemorrhage. Visualized sinuses and mastoid air cells are clear.  The IMPRESSION: 1. No acute intracranial abnormalities. 2. Status post left sided craniotomy. There is a focal area of left lateral frontal lobe encephalomalacia from previous hemorrhage and subsequent surgery. 3. Anterior inferior frontal lobe encephalomalacia likely from remote trauma. 4. Mild atrophy. Electronically Signed   By: Lajean Manes M.D.   On: 05/21/2015 19:01   Dg Chest Port 1 View  05/21/2015  CLINICAL DATA:  Altered mental status.  Fell today. EXAM: PORTABLE CHEST 1 VIEW COMPARISON:  04/30/2011 FINDINGS: Dual lead left-sided pacemaker in place. Leads project over the right atrium and ventricle. The cardiomediastinal contours are normal. Pulmonary vasculature is normal. No consolidation, pleural effusion, or pneumothorax. No acute osseous abnormalities are seen. IMPRESSION: No acute pulmonary process. Electronically Signed   By: Jeb Levering M.D.   On: 05/21/2015 19:42   05/22/15:  TTE - ejection fraction 55-60% - no wall motion abnormalities - grade 1 diastolic dysfunction.   05/22/15: EGD - large duodenal ulcer - ulcer in gastric antrum - erosive gastritis; directed submucosal injection: EPINEPHRINE  Medical Consultants:    Gastroenterology: Dr. Barney Drain  Psychiatry: Pending.  Anti-Infectives:   Anti-infectives    None      Subjective:    Dustin Estes. denies abdominal pain, black or bloody stools, nausea/vomiting.  Appetite is OK.    Objective:    Filed Vitals:   05/28/15 0644 05/28/15 1410 05/28/15 2059 05/29/15 0626  BP: 110/66 106/68 112/70 110/71  Pulse: 85 81 79 78  Temp: 98.3 F (36.8 C) 98.3 F (36.8 C) 98.2 F (36.8 C) 98.1 F (36.7 C)  TempSrc: Oral Oral Oral Oral  Resp: 17 18 17 17   Height:      Weight:      SpO2: 96% 96% 97% 97%    Intake/Output Summary (Last 24 hours) at 05/29/15 1201 Last data filed at 05/29/15  1009  Gross per 24 hour  Intake   1740 ml  Output   3000 ml  Net  -1260 ml   Filed Weights   05/21/15 1710 05/21/15 1717 05/21/15 2158  Weight: 98.431 kg (217 lb) 90.719 kg (200 lb) 81.4 kg (179 lb 7.3 oz)    Exam: Gen:  NAD Cardiovascular:  RRR, No M/R/G Respiratory:  Lungs CTAB* Gastrointestinal:  Abdomen soft, NT/ND, + BS Extremities:  No C/E/C   Data Reviewed:    Labs: Basic Metabolic Panel:  Recent Labs Lab 05/23/15 0340 05/24/15 0335 05/25/15 0525  NA 140 139 138  K 4.1 3.6 3.6  CL 109 107 104  CO2 23 27 28   GLUCOSE 173* 107* 115*  BUN 23* 11 6  CREATININE 0.89 0.87 0.87  CALCIUM 6.8* 7.2* 7.8*   GFR Estimated Creatinine Clearance: 84.7 mL/min (by C-G formula based on Cr of 0.87). Liver Function Tests:  Recent Labs Lab 05/24/15 0335 05/25/15 0525  AST 17 23  ALT 19 21  ALKPHOS 35* 47  BILITOT 0.5 0.7  PROT 3.6* 4.5*  ALBUMIN 1.9* 2.1*    Recent Labs Lab 05/25/15 0525  LIPASE 41    CBC:  Recent Labs Lab 05/23/15 1502  05/24/15 1835 05/25/15 0525 05/25/15 1816 05/26/15 0430 05/27/15 0829  WBC 10.4  < > 8.5 7.5 6.7 6.3 6.8  NEUTROABS 7.5  --   --   --   --   --   --   HGB 8.7*  < > 9.4* 8.8* 8.8* 8.3* 8.8*  HCT 24.9*  < > 27.0* 25.7* 26.8* 25.2* 27.4*  MCV 90.2  < > 90.9 91.1 91.8 91.0 92.3  PLT 106*  < > 115* 111* 132* 127* 174  < > = values in this interval not displayed. CBG:  Recent Labs Lab 05/28/15 1141 05/28/15 1650 05/28/15 2110 05/29/15 0723 05/29/15 1141  GLUCAP 149* 172* 219* 150* 106*   Sepsis Labs:  Recent Labs Lab 05/23/15 0340  05/23/15 1503  05/25/15 0525 05/25/15 1816 05/26/15 0430 05/27/15 0829  WBC 13.6*  < >  --   < > 7.5 6.7 6.3 6.8  LATICACIDVEN 0.8  --  0.8  --   --   --   --   --   < > = values in this interval not displayed. Microbiology Recent Results (from the past 240 hour(s))  Culture, blood (routine x 2)     Status: None   Collection Time: 05/21/15 11:15 PM  Result Value Ref  Range Status   Specimen Description BLOOD LEFT ARM  Final   Special Requests BOTTLES DRAWN AEROBIC AND ANAEROBIC 6CC EACH  Final   Culture NO GROWTH 5 DAYS  Final   Report Status 05/26/2015 FINAL  Final  Culture, blood (routine x 2)     Status: None   Collection Time: 05/21/15 11:18 PM  Result Value Ref Range Status   Specimen Description BLOOD RIGHT ANTECUBITAL  Final   Special Requests BOTTLES DRAWN AEROBIC AND ANAEROBIC 10CC EACH  Final   Culture NO GROWTH 5 DAYS  Final   Report Status 05/26/2015 FINAL  Final  MRSA PCR Screening     Status: None   Collection Time: 05/22/15  9:11 PM  Result Value Ref Range Status   MRSA by PCR NEGATIVE NEGATIVE Final    Comment:        The GeneXpert MRSA Assay (FDA approved for NASAL specimens only), is one component of a comprehensive MRSA colonization surveillance program. It is not intended to diagnose MRSA infection nor to guide or monitor treatment for MRSA infections.      Medications:   . cyanocobalamin  1,000 mcg Subcutaneous Daily  . insulin aspart  0-5 Units Subcutaneous QHS  . insulin aspart  0-9 Units Subcutaneous TID WC  . metoprolol tartrate  12.5 mg Oral BID  . nicotine  21 mg Transdermal Daily  . pantoprazole  40 mg Oral BID  . simvastatin  40 mg Oral QHS   Continuous Infusions:   Time spent: 25 minutes.   LOS: 8 days   [facility] Pager [phone]. If unable to reach me by pager, please call my cell phone at [phone].  *Please refer to [email or web address], password [email or web address] to get updated schedule on who will round on this patient, as hospitalists switch teams weekly. If 7PM-7AM, please contact night-coverage at [email or web address], password [email or web address] for any overnight needs.  05/29/2015, 12:01 PM

## 2015-05-30 LAB — BASIC METABOLIC PANEL
ANION GAP: 9 (ref 5–15)
BUN: 21 mg/dL — ABNORMAL HIGH (ref 6–20)
CALCIUM: 9 mg/dL (ref 8.9–10.3)
CO2: 27 mmol/L (ref 22–32)
Chloride: 101 mmol/L (ref 101–111)
Creatinine, Ser: 1 mg/dL (ref 0.61–1.24)
GLUCOSE: 119 mg/dL — AB (ref 65–99)
POTASSIUM: 4.2 mmol/L (ref 3.5–5.1)
SODIUM: 137 mmol/L (ref 135–145)

## 2015-05-30 LAB — CBC
HEMATOCRIT: 30.6 % — AB (ref 39.0–52.0)
HEMOGLOBIN: 9.8 g/dL — AB (ref 13.0–17.0)
MCH: 30.2 pg (ref 26.0–34.0)
MCHC: 32 g/dL (ref 30.0–36.0)
MCV: 94.4 fL (ref 78.0–100.0)
Platelets: 244 10*3/uL (ref 150–400)
RBC: 3.24 MIL/uL — AB (ref 4.22–5.81)
RDW: 14.8 % (ref 11.5–15.5)
WBC: 8.4 10*3/uL (ref 4.0–10.5)

## 2015-05-30 LAB — GLUCOSE, CAPILLARY
GLUCOSE-CAPILLARY: 126 mg/dL — AB (ref 65–99)
GLUCOSE-CAPILLARY: 132 mg/dL — AB (ref 65–99)
GLUCOSE-CAPILLARY: 199 mg/dL — AB (ref 65–99)
Glucose-Capillary: 173 mg/dL — ABNORMAL HIGH (ref 65–99)

## 2015-05-30 MED ORDER — METOPROLOL TARTRATE 25 MG PO TABS: 12.5000 mg | ORAL_TABLET | Freq: Two times a day (BID) | ORAL | Status: AC

## 2015-05-30 MED ORDER — NICOTINE 21 MG/24HR TD PT24: 21.0000 mg | MEDICATED_PATCH | Freq: Every day | TRANSDERMAL | Status: DC

## 2015-05-30 MED ORDER — CYANOCOBALAMIN 500 MCG PO TABS: 500.0000 ug | ORAL_TABLET | Freq: Every day | ORAL | Status: AC

## 2015-05-30 MED ORDER — PANTOPRAZOLE SODIUM 40 MG PO TBEC: 40.0000 mg | DELAYED_RELEASE_TABLET | Freq: Two times a day (BID) | ORAL | Status: AC

## 2015-05-30 NOTE — Progress Notes (Signed)
[facility]                                                                              Patient Demographics  Dustin Estes, is a 65 y.o. male, DOB - 04-Oct-1950, HC:[id number]  Admit date - 05/21/2015   Admitting Physician Waldemar Dickens, MD  Outpatient Primary MD for the patient is [name], MD  LOS - 9   Chief Complaint  Patient presents with  . Altered Mental Status       Brief HPI    Dustin Estes. is an 65 y.o. male with a PMH of tobacco abuse, HTN, Diabetes Type 2 controlled, who was admitted 05/21/15 with altered mental state. He was reportedly at home with family when he suddenly became weak and slumped over and refused to respond to family members or get up. Patient refused to come to the emergency room therefore the daughter went to the magistrate and obtained paperwork authorizing her to force him into the hospital. Family reported malonic stools at home over a few days. During his hospitalization patient received 2 units PRBC secondary to GI bleed. Patient was seen by GI and received EGD which revealed large duodenal ulcer - ulcer in gastric antrum - erosive gastritis; directed submucosal injection: EPINEPHRINE.   Assessment & Plan    Principle Problem: UGIB due to large duodenal ulcer - ulcer in gastric antrum - erosive gastritis  - Currently stable,  - Patient had originally presented to [facility] with altered mental status, melanotic stools. GI was consulted. EGD 2/3 showed a single ulcer in the gastric antrum and erosive gastritis in the gastric antrum. Procedure was complicated by active bleeding that was controlled. GI, Dr. Oneida Alar recommended that patient be transferred to [facility] for closer monitoring, patient was transferred to stepdown unit at [facility]. - Continue Protonix 40 mg BID. - Schedule follow-up appointment for 1 month with Dr. Danie Binder for upper GI bleed.  Active Problems: Acute encephalopathy in a  patient with encephalomalacia from prior hemorrhage/surgery - Patient was seen by PT, OT during the hospitalization and noticed that patient did not appear to be cognitively intact enough to live independently and requires 24-hour supervision. Patient however wanted to be discharged home on 2/9. Psychiatry was consulted on 2/10 for competency evaluation - Encephalopathy currently has improved - I have again called psychiatry today for capacity to make decisions. I discussed in detail with patient's daughter, Sammi Weyman on the phone who feels unsafe for the patient to be living independently at home alone.   Anemia of acute blood loss - severe Fe deficiency with near syncope - Hgb 7.6 on presentation - s/p 2U PRBC - H&H stable now, 9.8 today - Protonix 40 mg BID. - Follow-up with Dr. Juanita Craver and Dr. Trinda Pascal for upper GI bleed, acute kidney injury, HTN.  Severe B12 deficiency - Subcutaneous supplementation provided.  - Vitamin B 12 500 g daily f/u level in 6-8 weeks with PCP to determine if ongoing SQ/IM dosing required.   Acute kidney injury - Resolved w/ volume resuscitation & holding ACE-I.  HTN - Does not  appear to be an active problem at the present time. - PCP to determine if/when patient needs to start HTN medication.  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 Currently being managed with insulin sensitive SSI. - CBG 106-219. Oral hypoglycemics on hold.  HLD - Fenofibrate 160 mg QHS. - Zocor 40 mg daily.  OSA - Non-compliant with CPAP.  PAF / s/p Pacemaker - Continue ASA. Cardizem D/C'd. Now on lopressor.  Tobacco Abuse - Continue nicotine patch. Counseled.  Code Status full code   Family Communication: Discussed in detail with the patient, all imaging results, lab results explained to the patient. Also discussed with patient's daughter, Dustin Estes on the phone in detail who requested patient to be discharged to  skilled nursing facility as she feels he is unsafe to be at home independently.  Disposition Plan: Awaiting psych eval  Time Spent in minutes   25 minutes  Procedures  EGD  Consults   GI Psychiatry  DVT Prophylaxis  SCD's  Medications  Scheduled Meds: . cyanocobalamin  1,000 mcg Subcutaneous Daily  . insulin aspart  0-5 Units Subcutaneous QHS  . insulin aspart  0-9 Units Subcutaneous TID WC  . metoprolol tartrate  12.5 mg Oral BID  . nicotine  21 mg Transdermal Daily  . pantoprazole  40 mg Oral BID  . simvastatin  40 mg Oral QHS   Continuous Infusions:  PRN Meds:.acetaminophen **OR** acetaminophen, ondansetron **OR** ondansetron (ZOFRAN) IV   Antibiotics   Anti-infectives    None        Subjective:   Westyn Skousen was seen and examined today. Patient denies dizziness, chest pain, shortness of breath, abdominal pain, N/V/D/C, new weakness, numbess, tingling. Denies any bleeding. No acute events overnight.    Objective:   Blood pressure 100/57, pulse 82, temperature 98.7 F (37.1 C), temperature source Oral, resp. rate 18, height 5\' 9"  (1.753 m), weight 81.4 kg (179 lb 7.3 oz), SpO2 98 %.  Wt Readings from Last 3 Encounters:  05/21/15 81.4 kg (179 lb 7.3 oz)  12/02/14 98.431 kg (217 lb)  03/18/14 104.463 kg (230 lb 4.8 oz)     Intake/Output Summary (Last 24 hours) at 05/30/15 1306 Last data filed at 05/30/15 0937  Gross per 24 hour  Intake   1860 ml  Output   1450 ml  Net    410 ml    Exam  General: Alert and oriented, NAD  HEENT:  PERRLA, EOMI, Anicteric Sclera, mucous membranes moist.   Neck: Supple, no JVD, no masses  CVS: S1 S2 auscultated, no rubs, murmurs or gallops. Regular rate and rhythm.  Respiratory: Clear to auscultation bilaterally, no wheezing, rales or rhonchi  Abdomen: Soft, nontender, nondistended, + bowel sounds  Ext: no cyanosis clubbing or edema  Neuro: AAOx3, Cr N's II- XII. Strength 5/5 upper and lower extremities  bilaterally  Skin: No rashes  Psych: Normal affect and demeanor, alert and oriented   Data Review   Micro Results Recent Results (from the past 240 hour(s))  Culture, blood (routine x 2)     Status: None   Collection Time: 05/21/15 11:15 PM  Result Value Ref Range Status   Specimen Description BLOOD LEFT ARM  Final   Special Requests BOTTLES DRAWN AEROBIC AND ANAEROBIC [place]  Final   Culture NO GROWTH 5 DAYS  Final   Report Status 05/26/2015 FINAL  Final  Culture, blood (routine x 2)     Status: None   Collection Time:  05/21/15 11:18 PM  Result Value Ref Range Status   Specimen Description BLOOD RIGHT ANTECUBITAL  Final   Special Requests BOTTLES DRAWN AEROBIC AND ANAEROBIC 10CC EACH  Final   Culture NO GROWTH 5 DAYS  Final   Report Status 05/26/2015 FINAL  Final  MRSA PCR Screening     Status: None   Collection Time: 05/22/15  9:11 PM  Result Value Ref Range Status   MRSA by PCR NEGATIVE NEGATIVE Final    Comment:        The GeneXpert MRSA Assay (FDA approved for NASAL specimens only), is one component of a comprehensive MRSA colonization surveillance program. It is not intended to diagnose MRSA infection nor to guide or monitor treatment for MRSA infections.     Radiology Reports Ct Head Wo Contrast  05/21/2015  CLINICAL DATA:  EMS reports pt drank 9 raw eggs this morning for unknown reason. Pt's family reported to EMS that pt is usually able to ambulate but hasn't been able to lately and fell to the floor today and was unable to get up. Pt's family also reported to EMS that pt hasn't ate or drank for several weeks. EXAM: CT HEAD WITHOUT CONTRAST TECHNIQUE: Contiguous axial images were obtained from the base of the skull through the vertex without intravenous contrast. COMPARISON:  03/24/2010 FINDINGS: Ventricles are normal configuration. There is ventricular and sulcal enlargement reflecting mild atrophy. No hydrocephalus. There are no parenchymal masses or mass  effect. Encephalomalacia along the lateral left frontal lobe is from a previous hemorrhage. There is an overlying craniotomy flap. There is hypoattenuation consistent with additional encephalomalacia along the anterior, inferior frontal lobes bilaterally, likely from old trauma. There is no evidence of a recent cortical infarct. There are no extra-axial masses or abnormal fluid collections. There is no intracranial hemorrhage. Visualized sinuses and mastoid air cells are clear.  The IMPRESSION: 1. No acute intracranial abnormalities. 2. Status post left sided craniotomy. There is a focal area of left lateral frontal lobe encephalomalacia from previous hemorrhage and subsequent surgery. 3. Anterior inferior frontal lobe encephalomalacia likely from remote trauma. 4. Mild atrophy. Electronically Signed   By: Lajean Manes M.D.   On: 05/21/2015 19:01   Dg Chest Port 1 View  05/21/2015  CLINICAL DATA:  Altered mental status.  Fell today. EXAM: PORTABLE CHEST 1 VIEW COMPARISON:  04/30/2011 FINDINGS: Dual lead left-sided pacemaker in place. Leads project over the right atrium and ventricle. The cardiomediastinal contours are normal. Pulmonary vasculature is normal. No consolidation, pleural effusion, or pneumothorax. No acute osseous abnormalities are seen. IMPRESSION: No acute pulmonary process. Electronically Signed   By: Jeb Levering M.D.   On: 05/21/2015 19:42    CBC  Recent Labs Lab 05/23/15 1502  05/25/15 0525 05/25/15 1816 05/26/15 0430 05/27/15 0829 05/30/15 0730  WBC 10.4  < > 7.5 6.7 6.3 6.8 8.4  HGB 8.7*  < > 8.8* 8.8* 8.3* 8.8* 9.8*  HCT 24.9*  < > 25.7* 26.8* 25.2* 27.4* 30.6*  PLT 106*  < > 111* 132* 127* 174 244  MCV 90.2  < > 91.1 91.8 91.0 92.3 94.4  MCH 31.5  < > 31.2 30.1 30.0 29.6 30.2  MCHC 34.9  < > 34.2 32.8 32.9 32.1 32.0  RDW 14.3  < > 14.6 14.7 14.7 14.8 14.8  LYMPHSABS 1.9  --   --   --   --   --   --   MONOABS 0.9  --   --   --   --   --   --  EOSABS 0.1  --   --    --   --   --   --   BASOSABS 0.0  --   --   --   --   --   --   < > = values in this interval not displayed.  Chemistries   Recent Labs Lab 05/24/15 0335 05/25/15 0525 05/30/15 0730  NA 139 138 137  K 3.6 3.6 4.2  CL 107 104 101  CO2 27 28 27   GLUCOSE 107* 115* 119*  BUN 11 6 21*  CREATININE 0.87 0.87 1.00  CALCIUM 7.2* 7.8* 9.0  AST 17 23  --   ALT 19 21  --   ALKPHOS 35* 47  --   BILITOT 0.5 0.7  --    ------------------------------------------------------------------------------------------------------------------ estimated creatinine clearance is 73.6 mL/min (by C-G formula based on Cr of 1). ------------------------------------------------------------------------------------------------------------------ No results for input(s): HGBA1C in the last 72 hours. ------------------------------------------------------------------------------------------------------------------ No results for input(s): CHOL, HDL, LDLCALC, TRIG, CHOLHDL, LDLDIRECT in the last 72 hours. ------------------------------------------------------------------------------------------------------------------ No results for input(s): TSH, T4TOTAL, T3FREE, THYROIDAB in the last 72 hours.  Invalid input(s): FREET3 ------------------------------------------------------------------------------------------------------------------ No results for input(s): VITAMINB12, FOLATE, FERRITIN, TIBC, IRON, RETICCTPCT in the last 72 hours.  Coagulation profile No results for input(s): INR, PROTIME in the last 168 hours.  No results for input(s): DDIMER in the last 72 hours.  Cardiac Enzymes No results for input(s): CKMB, TROPONINI, MYOGLOBIN in the last 168 hours.  Invalid input(s): CK ------------------------------------------------------------------------------------------------------------------ Invalid input(s): [place]  05/29/15 0723 05/29/15 1141 05/29/15 1715 05/29/15 2226 05/30/15 0753  05/30/15 1144  GLUCAP 150* 106* 207* 234* 126* 132*     Matix Henshaw M.D. [facility] 05/30/2015, 1:06 PM  Pager: [phone] Between 7am to 7pm - call Pager - [phone]  After 7pm go to [email or web address] - password [email or web address]  Call night coverage person covering after 7pm

## 2015-05-31 DIAGNOSIS — R4182 Altered mental status, unspecified: Secondary | ICD-10-CM

## 2015-05-31 DIAGNOSIS — D62 Acute posthemorrhagic anemia: Secondary | ICD-10-CM

## 2015-05-31 LAB — GLUCOSE, CAPILLARY
GLUCOSE-CAPILLARY: 125 mg/dL — AB (ref 65–99)
GLUCOSE-CAPILLARY: 152 mg/dL — AB (ref 65–99)
GLUCOSE-CAPILLARY: 191 mg/dL — AB (ref 65–99)
GLUCOSE-CAPILLARY: 161 mg/dL — AB (ref 65–99)
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
## 2015-05-31 NOTE — Progress Notes (Signed)
Progress Note   [name]. HC:[id number] DOB: Oct 19, 1950 DOA: 05/21/2015 PCP: [name], MD   Brief Narrative:   [name]. is an 65 y.o. male with a PMH of tobacco abuse, HTN, Diabetes Type 2 controlled, who was admitted 05/21/15 with altered mental state. He was reportedly at home with family when he suddenly became weak and slumped over and refused to respond to family members or get up. Patient refused to come to the emergency room therefore the daughter went to the magistrate and obtained paperwork authorizing her to force him into the hospital. Family reported malonic stools at home over a few days. During his hospitalization patient received 2 units PRBC secondary to GI bleed. Patient was seen by GI and received EGD which revealed large duodenal ulcer - ulcer in gastric antrum - erosive gastritis; directed submucosal injection: EPINEPHRINE.  Assessment/Plan:   Principle Problem: UGIB due to large duodenal ulcer - ulcer in gastric antrum - erosive gastritis  - Continue Protonix 40 mg BID. - Schedule follow-up appointment for 1 month with Dr. Marga Melnick Fields upper GI bleed.  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  - Psychiatry consultation 05/31/15 appreciated: Patient not competent to make his own decisions. As per psychiatry, he obviously did not seek medical attention and could have died without treatment for blood loss. He is in a dangerous sitting at home and should not return there. Psychiatrist agrees with patient's daughter that it would be in his best interest to seek a more supervised placement for him and for her to get medical power of attorney for him. Does not meet criteria for psychiatric inpatient admission. - We will consult clinical social worker.  Anemia of acute blood loss - severe Fe deficiency with near syncope - Hgb 7.6 on presentation - s/p 2U PRBC,  hemoglobin stable. hemoglobin 9.8 today. - Protonix 40 mg BID. - Follow-up with Dr. Juanita Craver 1-2 weeks for upper GI bleed, acute kidney injury, HTN.  Severe B12 deficiency - Subcutaneous supplementation provided.  - Vitamin B 12 500 g daily f/u level in 6-8 weeks with PCP to determine if ongoing SQ/IM dosing required.   Acute kidney injury - Resolved w/ volume resuscitation & holding ACE-I.  HTN - Does not appear to be an active problem at the present time. - PCP to determine if/when patient needs to start HTN medication.  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  Currently being managed with insulin sensitive SSI. - CBG 106-219.  Oral hypoglycemics on hold.  HLD - Fenofibrate 160 mg QHS. - Zocor 40 mg daily.  OSA - Non-compliant with CPAP.  PAF / s/p Pacemaker - Continue ASA.  Cardizem D/C'd. Now on lopressor.  Tobacco Abuse - Continue nicotine patch.  Counseled.  Family Communication/Anticipated D/C date and plan/Code Status   Family Communication: Discussed with patient's daughter Ms. Chamberlain Markson on 05/31/15. She cannot accommodate patient upon discharge and is looking for placement. Disposition Plan: Consult clinical social worker for possible placement. Code Status: Full code. DVT prophylaxis: SCDs   IV Access:    Peripheral IV   Procedures and diagnostic studies:   Ct Head Wo Contrast  05/21/2015  CLINICAL DATA:  EMS reports pt drank 9 raw eggs this morning for unknown reason. Pt's family reported to EMS that pt is usually able to ambulate but hasn't been able to lately and fell to the floor  today and was unable to get up. Pt's family also reported to EMS that pt hasn't ate or drank for several weeks. EXAM: CT HEAD WITHOUT CONTRAST TECHNIQUE: Contiguous axial images were obtained from the base of the skull through the vertex without intravenous contrast. COMPARISON:  03/24/2010 FINDINGS: Ventricles are normal configuration.  There is ventricular and sulcal enlargement reflecting mild atrophy. No hydrocephalus. There are no parenchymal masses or mass effect. Encephalomalacia along the lateral left frontal lobe is from a previous hemorrhage. There is an overlying craniotomy flap. There is hypoattenuation consistent with additional encephalomalacia along the anterior, inferior frontal lobes bilaterally, likely from old trauma. There is no evidence of a recent cortical infarct. There are no extra-axial masses or abnormal fluid collections. There is no intracranial hemorrhage. Visualized sinuses and mastoid air cells are clear.  The IMPRESSION: 1. No acute intracranial abnormalities. 2. Status post left sided craniotomy. There is a focal area of left lateral frontal lobe encephalomalacia from previous hemorrhage and subsequent surgery. 3. Anterior inferior frontal lobe encephalomalacia likely from remote trauma. 4. Mild atrophy. Electronically Signed   By: Lajean Manes M.D.   On: 05/21/2015 19:01   Dg Chest Port 1 View  05/21/2015  CLINICAL DATA:  Altered mental status.  Fell today. EXAM: PORTABLE CHEST 1 VIEW COMPARISON:  04/30/2011 FINDINGS: Dual lead left-sided pacemaker in place. Leads project over the right atrium and ventricle. The cardiomediastinal contours are normal. Pulmonary vasculature is normal. No consolidation, pleural effusion, or pneumothorax. No acute osseous abnormalities are seen. IMPRESSION: No acute pulmonary process. Electronically Signed   By: Jeb Levering M.D.   On: 05/21/2015 19:42   05/22/15:  TTE - ejection fraction 55-60% - no wall motion abnormalities - grade 1 diastolic dysfunction.   05/22/15: EGD - large duodenal ulcer - ulcer in gastric antrum - erosive gastritis; directed submucosal injection: EPINEPHRINE  Medical Consultants:    Gastroenterology: Dr. Barney Drain  Psychiatry:   Anti-Infectives:   Anti-infectives    None      Subjective:    Denies complaints and asking when he  can go home. As per RN, no acute issues.  Objective:    Filed Vitals:   05/30/15 2126 05/30/15 2156 05/31/15 0611 05/31/15 1328  BP: 94/46 112/61 110/63 107/62  Pulse: 80  74 72  Temp: 98.7 F (37.1 C)  98.3 F (36.8 C) 98.8 F (37.1 C)  TempSrc: Oral  Oral Oral  Resp: 17  16 16   Height:      Weight:      SpO2: 95%  96% 95%    Intake/Output Summary (Last 24 hours) at 05/31/15 1551 Last data filed at 05/31/15 1423  Gross per 24 hour  Intake    840 ml  Output   1130 ml  Net   -290 ml   Filed Weights   05/21/15 1710 05/21/15 1717 05/21/15 2158  Weight: 98.431 kg (217 lb) 90.719 kg (200 lb) 81.4 kg (179 lb 7.3 oz)    Exam: Gen:  Pleasant middle-aged male lying comfortably supine in bed. Cardiovascular:  RRR, No M/R/G. Not on telemetry. Respiratory:  Lungs CTAB. No increased work of breathing. Gastrointestinal:  Abdomen soft, NT/ND, + BS Extremities:  No C/E/C Psychiatric: Pleasant and answers questions appropriately. CNS: Alert and oriented 2. No focal deficits   Data Reviewed:    Labs: Basic Metabolic Panel:  Recent Labs Lab 05/25/15 0525 05/30/15 0730  NA 138 137  K 3.6 4.2  CL 104 101  CO2 28 27  GLUCOSE 115* 119*  BUN 6 21*  CREATININE 0.87 1.00  CALCIUM 7.8* 9.0   GFR Estimated Creatinine Clearance: 73.6 mL/min (by C-G formula based on Cr of 1). Liver Function Tests:  Recent Labs Lab 05/25/15 0525  AST 23  ALT 21  ALKPHOS 47  BILITOT 0.7  PROT 4.5*  ALBUMIN 2.1*    Recent Labs Lab 05/25/15 0525  LIPASE 41    CBC:  Recent Labs Lab 05/25/15 0525 05/25/15 1816 05/26/15 0430 05/27/15 0829 05/30/15 0730  WBC 7.5 6.7 6.3 6.8 8.4  HGB 8.8* 8.8* 8.3* 8.8* 9.8*  HCT 25.7* 26.8* 25.2* 27.4* 30.6*  MCV 91.1 91.8 91.0 92.3 94.4  PLT 111* 132* 127* 174 244   CBG:  Recent Labs Lab 05/30/15 1144 05/30/15 1734 05/30/15 2119 05/31/15 0744 05/31/15 1145  GLUCAP 132* 199* 173* 125* 152*    Microbiology Recent Results (from  the past 240 hour(s))  Culture, blood (routine x 2)     Status: None   Collection Time: 05/21/15 11:15 PM  Result Value Ref Range Status   Specimen Description BLOOD LEFT ARM  Final   Special Requests BOTTLES DRAWN AEROBIC AND ANAEROBIC 6CC EACH  Final   Culture NO GROWTH 5 DAYS  Final   Report Status 05/26/2015 FINAL  Final  Culture, blood (routine x 2)     Status: None   Collection Time: 05/21/15 11:18 PM  Result Value Ref Range Status   Specimen Description BLOOD RIGHT ANTECUBITAL  Final   Special Requests BOTTLES DRAWN AEROBIC AND ANAEROBIC 10CC EACH  Final   Culture NO GROWTH 5 DAYS  Final   Report Status 05/26/2015 FINAL  Final  MRSA PCR Screening     Status: None   Collection Time: 05/22/15  9:11 PM  Result Value Ref Range Status   MRSA by PCR NEGATIVE NEGATIVE Final    Comment:        The GeneXpert MRSA Assay (FDA approved for NASAL specimens only), is one component of a comprehensive MRSA colonization surveillance program. It is not intended to diagnose MRSA infection nor to guide or monitor treatment for MRSA infections.      Medications:   . cyanocobalamin  1,000 mcg Subcutaneous Daily  . insulin aspart  0-5 Units Subcutaneous QHS  . insulin aspart  0-9 Units Subcutaneous TID WC  . metoprolol tartrate  12.5 mg Oral BID  . nicotine  21 mg Transdermal Daily  . pantoprazole  40 mg Oral BID  . simvastatin  40 mg Oral QHS   Continuous Infusions:   Time spent: 25 minutes.   LOS: 10 days   Santoria Chason, MD, FACP, FHM. [facility] Pager [phone]  If 7PM-7AM, please contact night-coverage [email or web address] Password [facility] 05/31/2015, 3:59 PM

## 2015-06-01 DIAGNOSIS — Z720 Tobacco use: Secondary | ICD-10-CM

## 2015-06-01 LAB — GLUCOSE, CAPILLARY
GLUCOSE-CAPILLARY: 165 mg/dL — AB (ref 65–99)
GLUCOSE-CAPILLARY: 107 mg/dL — AB (ref 65–99)

## 2015-06-01 NOTE — Progress Notes (Signed)
Occupational Therapy Treatment Patient Details Name: Yiming Leblanc. MRN: [id number] DOB: [birth year] Today's Date: 06/01/2015    History of present illness 65 y.o. male admitted to [facility] on 05/21/15 for AMS.  Pt dx with acute encephalopathy, UGIB, ABL anemia, and severe B12 deficiency.  Family had to petition the magistrate to force pt to come to the hospital because he was refusing to come per chart.  Pt with significant PMHx of DM, 2nd degree heart block, PAD, AAA, HTN, brain surgery, and pacemaker.   OT comments  Focus of today's session LB ADLS.  Pt. Requiring min verbal and demonstrational cues for sequencing and task completion.  Will continue to follow acutely.  Agree with initial assessment for 24/7 vs SNF for d/c planning.    Follow Up Recommendations  Home health OT;Supervision/Assistance - 24 hour;SNF    Equipment Recommendations       Recommendations for Other Services      Precautions / Restrictions Precautions Precautions: Fall Precaution Comments: pt mildly unsteady on his feet       Mobility Bed Mobility                  Transfers                      Balance                                   ADL Overall ADL's : Needs assistance/impaired                     Lower Body Dressing: Supervision/safety;Sitting/lateral leans;Cueing for sequencing                 General ADL Comments: pt. agreeable to complete LB dressing ie: don/doff socks while seated.  removed first sock without issue, then sat there and required cueing to initate removal of second sock.  pt. did state after removal "could i get a fresh pair".  when therapist asst. returned to the room with socks he said "yep, uh huh" and just sat there holding them.  required additional cues to begin donning the new pair of socks.        Vision                     Perception     Praxis      Cognition   Behavior During Therapy: WFL for tasks  assessed/performed Overall Cognitive Status: No family/caregiver present to determine baseline cognitive functioning                       Extremity/Trunk Assessment               Exercises     Shoulder Instructions       General Comments      Pertinent Vitals/ Pain       Pain Assessment: No/denies pain  Home Living                                          Prior Functioning/Environment              Frequency Min 2X/week     Progress Toward Goals  OT Goals(current goals can now be found in the care plan section)  Progress towards OT goals: Progressing toward goals     Plan Discharge plan remains appropriate    Co-evaluation                 End of Session     Activity Tolerance Patient tolerated treatment well   Patient Left in chair;with call bell/phone within reach;with chair alarm set   Nurse Communication          Time: 1135-1144 OT Time Calculation (min): 9 min  Charges: OT General Charges $OT Visit: 1 Procedure OT Treatments $Self Care/Home Management : 8-22 mins  Janice Coffin, COTA/L 06/01/2015, 12:05 PM

## 2015-06-01 NOTE — Progress Notes (Signed)
CSW and CM Nira Conn contacted the patient's daughter Anderson Malta at [phone] to discuss plans for disposition. Daughter Anderson Malta informed CSW that she will need to complete paperwork for HPOA due to the patient being incompetent. CSW informed daughter that [facility] typically assists with paperwork process, however will need to follow up. Daughter reports she would like for the patient to go to a skilled nursing facility. CSW informed daughter that patient does not have a skillable need for SNF placement. Daughter stated "He cannot go home". Daughter asked if the patient is able to stay in the hospital until she applies for medicaid. CSW and CM informed daughter that this is not an option, and patient is medically stable and ready for discharge on today. Daughter became upset and hung up the phone.  CSW followed up at the same number listed above to further discuss plan. Daughter stated "I will be at the hospital in one hour", and hung up again.  CSW and CM will follow up with daughter once she arrives.

## 2015-06-01 NOTE — Progress Notes (Signed)
Went over discharge papers with pt., pt.'s daughter, and pt.'s grandson. Daughter was taking pt. Home. Prescriptions were given to pt.'s daughter. IV taken out. No questions/ complaints. Daughter states she has someone (different family members) coming to check on pt. Every day of the week. Pt. Discharged via w/c successfully.

## 2015-06-01 NOTE — Progress Notes (Signed)
CSW and CM spoke with patient's daughter Dustin Estes regarding plans for discharge. Dustin Estes started off the conversation by apologizing for hanging up the phone. Dustin Estes states she has been frustrated and has never been through this process before. Dustin Estes reports that she would like to apply for medicaid first thing in the morning while the patient remains here overnight. CSW and CM informed daughter that this is not an option, and that patient is medically stable and ready for discharge. Dustin Estes reports she is unsure of what to do at this point. Daughter informed CSW and CM that she will take the patient home, and family will rotate in and out to assist him. Daughter reports she is going home to get another car to transport patient. No further CSW needs requested at this time. CSW to sign off.   Please consult if further CSW needs arise.   Lucius Conn, [place] Worker [facility] Ph: [phone]

## 2015-06-01 NOTE — Progress Notes (Signed)
Physical Therapy Treatment Patient Details Name: Dustin Estes. MRN: [id number] DOB: 26-Aug-1950 Today's Date: 06/01/2015    History of Present Illness 65 y.o. male admitted to [facility] on 05/21/15 for AMS.  Pt dx with acute encephalopathy, UGIB, ABL anemia, and severe B12 deficiency.  Family had to petition the magistrate to force pt to come to the hospital because he was refusing to come per chart.  Pt with significant PMHx of DM, 2nd degree heart block, PAD, AAA, HTN, brain surgery, and pacemaker.    PT Comments    Pt progressing well with gait.  Berg Balance test preformed and pt scored 49/56 putting him at moderate risk for falls.  Pt with decreased STM demonstrated throughout this session as he was unable to recall that I had seen him in the past and told multiple stories over to the same person twice.  Although mobilizing well, pt is likely very unsafe living at home alone with his current cognitive status.    Follow Up Recommendations  Home health PT;Supervision/Assistance - 24 hour     Equipment Recommendations  None recommended by PT    Recommendations for Other Services   NA     Precautions / Restrictions Precautions Precautions: Fall Precaution Comments: moderate fall risk per Merrilee Jansky    Mobility                    Transfers Overall transfer level: Needs assistance Equipment used: None Transfers: Sit to/from Stand Sit to Stand: Modified independent (Device/Increase time)         General transfer comment: Mod I due to realiace on hands for transitions.  When asked, he can preform without hands, but self selects to use hands when not prompted.   Ambulation/Gait Ambulation/Gait assistance: Supervision Ambulation Distance (Feet): 450 Feet Assistive device: None Gait Pattern/deviations: Step-through pattern;Staggering left;Staggering right Gait velocity: decreased Gait velocity interpretation: Below normal speed for age/gender General Gait Details: mildly  staggering gait pattern with supervision for safety.  LOB increase with multi tasking.           Balance Overall balance assessment: Needs assistance Sitting-balance support: Feet supported;No upper extremity supported Sitting balance-Leahy Scale: Good     Standing balance support: No upper extremity supported Standing balance-Leahy Scale: Good                      Cognition Arousal/Alertness: Awake/alert Behavior During Therapy: WFL for tasks assessed/performed Overall Cognitive Status: History of cognitive impairments - at baseline       Memory: Decreased short-term memory                     Pertinent Vitals/Pain Pain Assessment: No/denies pain           PT Goals (current goals can now be found in the care plan section) Acute Rehab PT Goals Patient Stated Goal: to go home Progress towards PT goals: Progressing toward goals    Frequency  Min 3X/week    PT Plan Current plan remains appropriate       End of Session Equipment Utilized During Treatment: Gait belt Activity Tolerance: Patient tolerated treatment well Patient left: in chair;with call bell/phone within reach;with chair alarm set;with family/visitor present (daughter and son in law in room)     Time: [id number] PT Time Calculation (min) (ACUTE ONLY): 37 min  Charges:  $Gait Training: 8-22 mins $Physical Performance Test: 8-22 mins  Barbarann Ehlers [place], [place], DPT [phone]   06/01/2015, 1:12 PM

## 2015-06-01 NOTE — Care Management Important Message (Signed)
Important Message  Patient Details  Name: Dustin Estes. MRN: [id number] Date of Birth: 1950/07/11   Medicare Important Message Given:  Yes    Barb Merino [name] 06/01/2015, 1:54 PM

## 2015-06-01 NOTE — Discharge Summary (Signed)
Physician Discharge Summary  [name].  [id number]  DOB: [birth year]  DOA: 05/21/2015  PCP: [name], MD  Admit date: 05/21/2015 Discharge date: 06/01/2015  Time spent: Greater than 30 minutes  Recommendations for Outpatient Follow-up:  1. Dr. Juanita Craver, PCP in one week with repeat labs (CBC & BMP). 2. Dr. Barney Drain, GI in 1 month  Discharge Diagnoses:  Active Problems:   OSA (obstructive sleep apnea): non compliant with CPAP   HTN (hypertension)   Dyslipidemia   Tobacco abuse   Pacemaker   Second degree AV block, Mobitz type II   Upper GI bleed   Near syncope   Acute encephalopathy   Severe anemia   Controlled type 2 diabetes mellitus with diabetic nephropathy (HCC)   Acute blood loss anemia   Discharge Condition: Improved & Stable  Diet recommendation: Heart healthy and diabetic diet.  Filed Weights   05/21/15 1710 05/21/15 1717 05/21/15 2158  Weight: 98.431 kg (217 lb) 90.719 kg (200 lb) 81.4 kg (179 lb 7.3 oz)    History of present illness:  Dustin Estes. is an 65 y.o. male with a PMH of tobacco abuse, HTN, Diabetes Type 2 controlled, who was admitted 05/21/15 with altered mental state. He was reportedly at home with family when he suddenly became weak and slumped over and refused to respond to family members or get up. Patient refused to come to the emergency room therefore the daughter went to the magistrate and obtained paperwork authorizing her to force him into the hospital. Family reported malonic stools at home over a few days. During his hospitalization patient received 2 units PRBC secondary to GI bleed. Patient was seen by GI and received EGD which revealed large duodenal ulcer - ulcer in gastric antrum - erosive gastritis; directed submucosal injection: EPINEPHRINE.  Hospital Course:   Principle Problem: UGIB due to large duodenal ulcer - ulcer in gastric antrum - erosive gastritis  - Continue Protonix 40 mg BID. Aspirin was  stopped. - Schedule follow-up appointment for 1 month with Dr. Danie Binder regarding follow-up of upper GI bleed.  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  - Psychiatry consultation 05/31/15 appreciated: Patient not competent to make his own decisions. As per psychiatry, he obviously did not seek medical attention and could have died without treatment for blood loss. He is in a dangerous situation at home and should not return there. Psychiatrist agrees with patient's daughter that it would be in his best interest to seek a more supervised placement for him and for her to get medical power of attorney for him. Does not meet criteria for psychiatric inpatient admission. - As per discussion with case management today: Patient does not have skillable needs for admission to SNF. His insurance will not cover any form of placement. Family plans to take patient home and take turns to monitor him closely. They plan to apply for Medicaid. Since patient may not have 24/7 supervision, per case management even home health services cannot be arranged.   Anemia of acute blood loss - severe Fe deficiency with near syncope - Hgb 7.6 on presentation - s/p 2U PRBC, hemoglobin stable.  - Protonix 40 mg BID. - Follow-up with Dr. Juanita Craver 1 week with repeat CBC and BMP.   Severe B12 deficiency - Subcutaneous supplementation provided.  - Vitamin B 12 500 g daily f/u level in 6-8 weeks with PCP to determine if ongoing SQ/IM  dosing required.   Acute kidney injury - Resolved w/ volume resuscitation & holding ACE-I.  HTN - Does not appear to be an active problem at the present time. - PCP to determine if/when patient needs to start HTN medication.ACE inhibitors and Cardizem were discontinued. Continue low-dose metoprolol controlled.   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 Treated in  the hospital with SSI. Resume oral hypoglycemics at DC.  HLD - Fenofibrate 160 mg QHS. - Zocor 40 mg daily.  OSA - Non-compliant with CPAP.  PAF / s/p Pacemaker - Cardizem D/C'd. Now on lopressor. Aspirin currently discontinued secondary to upper GI bleed. This can be reassessed during outpatient follow-up with PCP and GI as to timing of resumption of aspirin. Probably not an anticoagulation candidate why he was not on anticoagulation before.   Tobacco Abuse - Continue nicotine patch. Counseled.      Consultants  Gastroenterology: Dr. Barney Drain   Psychiatry   Procedures 05/22/15: TTE - ejection fraction 55-60% - no wall motion abnormalities - grade 1 diastolic dysfunction.  05/22/15: EGD - large duodenal ulcer - ulcer in gastric antrum - erosive gastritis; directed submucosal injection: EPINEPHRINE   Discharge Exam:  Complaints:  patient denies complaints and is anxious to go home. No acute issues reported by nursing.   Filed Vitals:   05/31/15 2130 06/01/15 0407 06/01/15 1000 06/01/15 1306  BP: 113/63 118/64 101/57 114/84  Pulse: 80 77 79 75  Temp: 98.1 F (36.7 C) 98 F (36.7 C)  98.2 F (36.8 C)  TempSrc: Oral Oral  Oral  Resp: 17 18  18   Height:      Weight:      SpO2: 95% 96%  99%    Gen: Pleasant middle-aged male sitting up comfortably in chair this morning. Cardiovascular: RRR, No M/R/G. Not on telemetry. Respiratory: Lungs CTAB. No increased work of breathing. Gastrointestinal: Abdomen soft, NT/ND, + BS Extremities: No C/E/C Psychiatric: Pleasant and answers questions appropriately. CNS: Alert and oriented 2. No focal deficits  Discharge Instructions      Discharge Instructions    Call MD for:  difficulty breathing, headache or visual disturbances    Complete by:  As directed      Call MD for:  extreme fatigue    Complete by:  As directed      Call MD for:  persistant dizziness or light-headedness    Complete by:  As directed       Call MD for:  persistant nausea and vomiting    Complete by:  As directed      Call MD for:  severe uncontrolled pain    Complete by:  As directed      Call MD for:  temperature >100.4    Complete by:  As directed      Call MD for:    Complete by:  As directed   Recurrent black stools or blood in stools or vomiting coffee-colored material or blood.     Diet - low sodium heart healthy    Complete by:  As directed      Diet Carb Modified    Complete by:  As directed      Increase activity slowly    Complete by:  As directed             Medication List    STOP taking these medications        aspirin EC 325 MG tablet     diltiazem 360  MG 24 hr capsule  Commonly known as:  CARDIZEM CD     quinapril 20 MG tablet  Commonly known as:  ACCUPRIL      TAKE these medications        cyanocobalamin 500 MCG tablet  Take 1 tablet (500 mcg total) by mouth daily.     fenofibrate 160 MG tablet  Take 160 mg by mouth at bedtime.     Flaxseed (Linseed) 1000 MG Caps  Take 1 capsule by mouth 2 (two) times daily.     glipiZIDE 10 MG tablet  Commonly known as:  GLUCOTROL  Take 10 mg by mouth every morning.     metFORMIN 1000 MG tablet  Commonly known as:  GLUCOPHAGE  Take 1,000 mg by mouth 2 (two) times daily with a meal.     metoprolol tartrate 25 MG tablet  Commonly known as:  LOPRESSOR  Take 0.5 tablets (12.5 mg total) by mouth 2 (two) times daily.     multivitamin with minerals Tabs tablet  Take 1 tablet by mouth at bedtime. Centrum Silver     niacin 500 MG tablet  Take 500 mg by mouth 2 (two) times daily with a meal.     nicotine 21 mg/24hr patch  Commonly known as:  NICODERM CQ - dosed in mg/24 hours  Place 1 patch (21 mg total) onto the skin daily.     pantoprazole 40 MG tablet  Commonly known as:  PROTONIX  Take 1 tablet (40 mg total) by mouth 2 (two) times daily.     simvastatin 40 MG tablet  Commonly known as:  ZOCOR  Take 40 mg by mouth at bedtime.        Follow-up Information    Follow up with Barney Drain, MD. Schedule an appointment as soon as possible for a visit in 1 month.   Specialty:  Gastroenterology   Why:  Regarding follow-up of upper GI bleeding.   Contact information:   [place] [address] [phone]       Follow up with BURNETT,BRENT A, MD. Schedule an appointment as soon as possible for a visit in 1 week.   Specialty:  Family Medicine   Why:  To be seen with repeat labs (CBC & BMP).   Contact information:   [address] [place] [address] [phone]       Get Medicines reviewed and adjusted: Please take all your medications with you for your next visit with your Primary MD  Please request your Primary MD to go over all hospital tests and procedure/radiological results at the follow up. Please ask your Primary MD to get all Hospital records sent to his/her office.  If you experience worsening of your admission symptoms, develop shortness of breath, life threatening emergency, suicidal or homicidal thoughts you must seek medical attention immediately by calling 911 or calling your MD immediately if symptoms less severe.  You must read complete instructions/literature along with all the possible adverse reactions/side effects for all the Medicines you take and that have been prescribed to you. Take any new Medicines after you have completely understood and accept all the possible adverse reactions/side effects.   Do not drive when taking pain medications.   Do not take more than prescribed Pain, Sleep and Anxiety Medications  Special Instructions: If you have smoked or chewed Tobacco in the last 2 yrs please stop smoking, stop any regular Alcohol and or any  Recreational drug use.  Wear Seat belts while driving.  Please note  You were cared for by a hospitalist during your hospital stay. Once you are discharged, your primary care  physician will handle any further medical issues. Please note that NO REFILLS for any discharge medications will be authorized once you are discharged, as it is imperative that you return to your primary care physician (or establish a relationship with a primary care physician if you do not have one) for your aftercare needs so that they can reassess your need for medications and monitor your lab values.    The results of significant diagnostics from this hospitalization (including imaging, microbiology, ancillary and laboratory) are listed below for reference.    Significant Diagnostic Studies: Ct Head Wo Contrast  05/21/2015  CLINICAL DATA:  EMS reports pt drank 9 raw eggs this morning for unknown reason. Pt's family reported to EMS that pt is usually able to ambulate but hasn't been able to lately and fell to the floor today and was unable to get up. Pt's family also reported to EMS that pt hasn't ate or drank for several weeks. EXAM: CT HEAD WITHOUT CONTRAST TECHNIQUE: Contiguous axial images were obtained from the base of the skull through the vertex without intravenous contrast. COMPARISON:  03/24/2010 FINDINGS: Ventricles are normal configuration. There is ventricular and sulcal enlargement reflecting mild atrophy. No hydrocephalus. There are no parenchymal masses or mass effect. Encephalomalacia along the lateral left frontal lobe is from a previous hemorrhage. There is an overlying craniotomy flap. There is hypoattenuation consistent with additional encephalomalacia along the anterior, inferior frontal lobes bilaterally, likely from old trauma. There is no evidence of a recent cortical infarct. There are no extra-axial masses or abnormal fluid collections. There is no intracranial hemorrhage. Visualized sinuses and mastoid air cells are clear.  The IMPRESSION: 1. No acute intracranial abnormalities. 2. Status post left sided craniotomy. There is a focal area of left lateral frontal lobe  encephalomalacia from previous hemorrhage and subsequent surgery. 3. Anterior inferior frontal lobe encephalomalacia likely from remote trauma. 4. Mild atrophy. Electronically Signed   By: Lajean Manes M.D.   On: 05/21/2015 19:01   Dg Chest Port 1 View  05/21/2015  CLINICAL DATA:  Altered mental status.  Fell today. EXAM: PORTABLE CHEST 1 VIEW COMPARISON:  04/30/2011 FINDINGS: Dual lead left-sided pacemaker in place. Leads project over the right atrium and ventricle. The cardiomediastinal contours are normal. Pulmonary vasculature is normal. No consolidation, pleural effusion, or pneumothorax. No acute osseous abnormalities are seen. IMPRESSION: No acute pulmonary process. Electronically Signed   By: Jeb Levering M.D.   On: 05/21/2015 19:42    Microbiology: Recent Results (from the past 240 hour(s))  MRSA PCR Screening     Status: None   Collection Time: 05/22/15  9:11 PM  Result Value Ref Range Status   MRSA by PCR NEGATIVE NEGATIVE Final    Comment:        The GeneXpert MRSA Assay (FDA approved for NASAL specimens only), is one component of a comprehensive MRSA colonization surveillance program. It is not intended to diagnose MRSA infection nor to guide or monitor treatment for MRSA infections.      Labs: Basic Metabolic Panel:  Recent Labs Lab 05/30/15 0730  NA 137  K 4.2  CL 101  CO2 27  GLUCOSE 119*  BUN 21*  CREATININE 1.00  CALCIUM 9.0   Liver Function Tests: No results for input(s): AST, ALT, ALKPHOS, BILITOT, PROT, ALBUMIN in the  last 168 hours. No results for input(s): LIPASE, AMYLASE in the last 168 hours. No results for input(s): AMMONIA in the last 168 hours. CBC:  Recent Labs Lab 05/25/15 1816 05/26/15 0430 05/27/15 0829 05/30/15 0730  WBC 6.7 6.3 6.8 8.4  HGB 8.8* 8.3* 8.8* 9.8*  HCT 26.8* 25.2* 27.4* 30.6*  MCV 91.8 91.0 92.3 94.4  PLT 132* 127* 174 244   Cardiac Enzymes: No results for input(s): CKTOTAL, CKMB, CKMBINDEX, TROPONINI in the  last 168 hours. BNP: BNP (last 3 results) No results for input(s): BNP in the last 8760 hours.  ProBNP (last 3 results) No results for input(s): PROBNP in the last 8760 hours.  CBG:  Recent Labs Lab 05/31/15 1145 05/31/15 1654 05/31/15 2214 06/01/15 0740 06/01/15 1110  GLUCAP 152* 191* 161* 165* 107*       Signed:  Vernell Leep, MD, FACP, FHM. [facility] Pager [phone]  If 7PM-7AM, please contact night-coverage [email or web address] Password [facility] 06/01/2015, 3:06 PM

## 2015-06-01 NOTE — Care Management Note (Addendum)
Case Management Note  Patient Details  Name: Dustin Estes. MRN: [id number] Date of Birth: [birth year]  Subjective/Objective:                    Action/Plan:  See Blanch Media SW note . Daughter Anderson Malta aware PT recommending 24 hour supervision . Jennifer plans on taking patient to his home ( address in [facility] ) and having family and friends check on him frequently . She will apply for Medicaid and look at long term placement after Medicaid received.   PT recommending HHPT . Referral given to [facility] with [place] . Manuela Schwartz reviewed chart and is unable to take referral unless family provides 24 hour supervision .  Manuela Schwartz will call [facility] directly .   Manuela Schwartz with [facility] , will see patient . Asking for orders for PT, aide , Advanced will make home visit and assess home environment.  Expected Discharge Date:  05/25/15               Expected Discharge Plan:     In-House Referral:     Discharge planning Services  CM Consult  Post Acute Care Choice:    Choice offered to:     DME Arranged:    DME Agency:     HH Arranged:    HH Agency:     Status of Service:  Completed, signed off  Medicare Important Message Given:  Yes Date Medicare IM Given:    Medicare IM give by:    Date Additional Medicare IM Given:    Additional Medicare Important Message give by:     If discussed at [place] of Stay Meetings, dates discussed:    Additional Comments:  Marilu Favre, RN 06/01/2015, 2:50 PM

## 2015-06-04 ENCOUNTER — Telehealth: Payer: Self-pay | Admitting: Gastroenterology

## 2015-06-04 NOTE — Telephone Encounter (Signed)
Please call pt's daughter. His stomach Bx shows gastritis/ulcers from ASA.   NO MRI UNTIL AFTER MAR 4.  PROTONIX BID FOR 3 mos THEN DAILY.  REPEAT EGD IN MAY 2017.

## 2015-06-05 NOTE — Telephone Encounter (Signed)
Pt's daughter, Guerry Minors, is aware of the results.

## 2015-06-05 NOTE — Telephone Encounter (Signed)
Reminder in epic °

## 2015-06-10 ENCOUNTER — Telehealth: Payer: Self-pay | Admitting: Cardiovascular Disease

## 2015-06-10 NOTE — Telephone Encounter (Signed)
Spoke w/ pt and informed him that he needs the wirex adapter in order for the home monitor to work. Pt is going to find the wirex adapter and call back.

## 2015-06-10 NOTE — Telephone Encounter (Signed)
Attempted to return pt call. No answer and unable to leave a message.  

## 2015-06-10 NOTE — Telephone Encounter (Signed)
Pt says his pacemaker monitor at home is not working.

## 2015-06-12 ENCOUNTER — Encounter: Payer: Self-pay | Admitting: *Deleted

## 2015-06-12 ENCOUNTER — Ambulatory Visit (INDEPENDENT_AMBULATORY_CARE_PROVIDER_SITE_OTHER): Payer: Medicare Other | Admitting: Diagnostic Neuroimaging

## 2015-06-12 VITALS — BP 111/69 | HR 102 | Ht 67.0 in | Wt 196.4 lb

## 2015-06-12 DIAGNOSIS — R404 Transient alteration of awareness: Secondary | ICD-10-CM

## 2015-06-12 NOTE — Progress Notes (Signed)
[facility]  PATIENT: Dustin Estes. DOB: Jan 02, 1951  REFERRING CLINICIAN: Tollie Pizza HISTORY FROM: patient  REASON FOR VISIT: new consult    HISTORICAL  CHIEF COMPLAINT:  Chief Complaint  Patient presents with  . Eval mental status    rm 7, New pt    HISTORY OF PRESENT ILLNESS:   65 year old male here for evaluation of medical competency and mental state. Hospital discharge notes an referring M.D. notes were reviewed and summarized as below.  Patient was admitted to the hospital from February 2 until 06/01/2015. Patient has history of hypertension, tobacco abuse diabetes hypertension, and was brought to the hospital after he had progressive malaise and decline at home. At one point patient was so weak at home, unable to respond to his family members or get up, patient's daughter called paramedics to have him admitted to the hospital. Initially patient declined and felt that he was fine initially had some rest. Ultimately police had to be called to take patient against his wishes to the hospital for evaluation. Once he was evaluated in the emergency room he was diagnosed with acute GI bleeding due to large duodenal ulcer. He required prolonged hospital stay, blood transfusion and other workup. Patient was also diagnosed with severe B12 deficiency, acute kidney injury, and poor functional status at home. Patient was medically stabilized and discharge planning was pursued. Patient's daughter felt that patient was not capable of taking care of himself at home. Therefore psychiatry consultation was obtained to determine medical decision-making capability. Patient did not meet criteria for psychiatry inpatient admission. However he was deemed not competent to make his own decisions. It was determined that he should not live alone and would need more supervision from his family or other parties.  Since that time patient has moved back home. He is doing better from a mental  and cognitive and physical standpoint that before. He demonstrates good insight into his current situation and knows that if in the future he has some medical problems and his family requested to go to the doctor, he will comply.  However on conversation patient is at times tangential and long-winded in conversation. He is quite disheveled, unkempt, smells of severe cigarette smoke and is quite malodorous. He talks excitedly about returning home from clinic to prepare a large pan of chocolate and pineapple pudding which he plans to eat over the next few days.  Patient does have good memory of his prior working life. Apparently he retired in 2012. He has been living on his own since that time. Apparently he had multiple falls in 2011 and 12 with subsequent subdural hematoma, traumatic brain injury and needed neurosurgical drainage. After these traumas he moved in to a home alone by his brother [place]. Patient currently living alone, driving, taking care of his day-to-day activities. Review of hospital notes indicates that his home is quite a mess with garbage and rats everywhere. Unfortunately patient is here for this visit alone and no collateral history available.    REVIEW OF SYSTEMS: Full 14 system review of systems performed and negative with exception of: Weight loss cough snoring blood in stool blood and urine passing out headache anemia.  ALLERGIES: No Known Allergies  HOME MEDICATIONS: Outpatient Prescriptions Prior to Visit  Medication Sig Dispense Refill  . cyanocobalamin 500 MCG tablet Take 1 tablet (500 mcg total) by mouth daily. 30 tablet 2  . fenofibrate 160 MG tablet Take 160 mg by mouth at bedtime.      Marland Kitchen  Flaxseed, Linseed, 1000 MG CAPS Take 1 capsule by mouth 2 (two) times daily.     Marland Kitchen glipiZIDE (GLUCOTROL) 10 MG tablet Take 10 mg by mouth every morning.      . metFORMIN (GLUCOPHAGE) 1000 MG tablet Take 1,000 mg by mouth 2 (two) times daily with a meal.     .  metoprolol tartrate (LOPRESSOR) 25 MG tablet Take 0.5 tablets (12.5 mg total) by mouth 2 (two) times daily. 60 tablet 2  . Multiple Vitamin (MULITIVITAMIN WITH MINERALS) TABS Take 1 tablet by mouth at bedtime. Centrum Silver    . niacin 500 MG tablet Take 500 mg by mouth 2 (two) times daily with a meal.      . nicotine (NICODERM CQ - DOSED IN MG/24 HOURS) 21 mg/24hr patch Place 1 patch (21 mg total) onto the skin daily. 28 patch 1  . pantoprazole (PROTONIX) 40 MG tablet Take 1 tablet (40 mg total) by mouth 2 (two) times daily. 60 tablet 2  . simvastatin (ZOCOR) 40 MG tablet Take 40 mg by mouth at bedtime.      No facility-administered medications prior to visit.    PAST MEDICAL HISTORY: Past Medical History  Diagnosis Date  . Dysrhythmia   . Diabetes mellitus   . 2Nd degree AV block 04/29/2011    Medtronic Adapta  . PAD (peripheral artery disease) ([place])   . Abdominal aortic aneurysm (HCC)     4.1 x 4.3 cm  . Hypertension   . Obesity   . Tobacco abuse   . OSA (obstructive sleep apnea)   . Encephalomalacia on imaging study   . AAA (abdominal aortic aneurysm) ([place]) 12/10/2012  . SVT (supraventricular tachycardia) ([place]) 04/30/2011  . Second degree AV block, Mobitz type II 03/19/2014  . Paroxysmal atrial fibrillation ([place]) 03/19/2014    Asymptomatic, pacemaker detected     PAST SURGICAL HISTORY: Past Surgical History  Procedure Laterality Date  . Permanent pacemaker insertion  04/29/2011    Medtronic Adapta  . Brain surgery    . Appendectomy  12/69  . Bladder surgery  9/04,11/05,9/07    tumor removal  . US echocardiography  04/13/2010    mild asymmetric LVH, EF >55%,mild mitral ca+,AOV mildly sclerotic  . Nm myocar perf wall motion  04/13/2010    moderate, mostly fixed  inferoseptal defect, suspicious for artifact.  . Permanent pacemaker insertion N/A 04/29/2011    Procedure: PERMANENT PACEMAKER INSERTION;  Surgeon: Sanda Klein, MD;  Location: [facility];  Service:  Cardiovascular;  Laterality: N/A;  . Loop recorder explant  04/29/2011    Procedure: LOOP RECORDER EXPLANT;  Surgeon: Sanda Klein, MD;  Location: [facility];  Service: Cardiovascular;;  . Esophagogastroduodenoscopy N/A 05/22/2015    Procedure: ESOPHAGOGASTRODUODENOSCOPY (EGD);  Surgeon: Danie Binder, MD;  [facility];  Service: Endoscopy;  Laterality: N/A;    FAMILY HISTORY: Family History  Problem Relation Age of Onset  . Diabetes Mother   . Diabetes Father   . Stroke Father     SOCIAL HISTORY:  Social History   Social History  . Marital Status: Divorced    Spouse Name: N/A  . Number of Children: 2  . Years of Education: GED   Occupational History  .      retired, owned Sales promotion account executive, [employer]  . Smoking status: Current Every Day Smoker -- 2.00 packs/day for 47 years    Types: Cigarettes  . Smokeless tobacco: Never Used  Comment: 4 (10packs per caroton) cartons/month (01/13/14)  . Alcohol Use: Yes     Comment: "hardly ever"  . Drug Use: No  . Sexual Activity: No   Other Topics Concern  . Not on file   Social History Narrative   Single, lives alone   Caffeine use- coffee 2 cups/week     PHYSICAL EXAM   GENERAL EXAM/CONSTITUTIONAL: Vitals:  Filed Vitals:   06/12/15 0943  BP: 111/69  Pulse: 102  Height: 5\' 7"  (1.702 m)  Weight: 196 lb 6.4 oz (89.086 kg)     Body mass index is 30.75 kg/(m^2).  Visual Acuity Screening   Right eye Left eye Both eyes  Without correction: 20/50 20/50   With correction:        Patient is in no distress; DISHEVELED, SMELLS OF SMOKE, well developed, nourished and groomed; neck is supple  CRANIOTOMY SCAR ON LEFT SIDE  CARDIOVASCULAR:  Examination of carotid arteries is normal; no carotid bruits  Regular rate and rhythm, no murmurs  Examination of peripheral vascular system by observation and palpation is normal  EYES:  Ophthalmoscopic exam of optic discs and  posterior segments is normal; no papilledema or hemorrhages  MUSCULOSKELETAL:  Gait, strength, tone, movements noted in Neurologic exam below  NEUROLOGIC: MENTAL STATUS:  No flowsheet data found.  awake, alert, oriented to person, place and time  recent and remote memory intact  normal attention and concentration  language fluent, comprehension intact, naming intact,   fund of knowledge appropriate  SOME WHAT TANGENTIAL AT TIMES  CRANIAL NERVE:   2nd - no papilledema on fundoscopic exam  2nd, 3rd, 4th, 6th - pupils equal and reactive to light, visual fields full to confrontation, extraocular muscles intact, no nystagmus  5th - facial sensation symmetric  7th - facial strength symmetric  8th - hearing intact  9th - palate elevates symmetrically, uvula midline  11th - shoulder shrug symmetric  12th - tongue protrusion midline  MOTOR:   normal bulk and tone, full strength in the BUE, BLE  SENSORY:   normal and symmetric to light touch, temperature, vibration  COORDINATION:   finger-nose-finger, fine finger movements normal  REFLEXES:   deep tendon reflexes TRACE and symmetric  GAIT/STATION:   narrow based gait    DIAGNOSTIC DATA (LABS, IMAGING, TESTING) - I reviewed patient records, labs, notes, testing and imaging myself where available.  Lab Results  Component Value Date   WBC 8.4 05/30/2015   HGB 9.8* 05/30/2015   HCT 30.6* 05/30/2015   MCV 94.4 05/30/2015   PLT 244 05/30/2015      Component Value Date/Time   NA 137 05/30/2015 0730   K 4.2 05/30/2015 0730   CL 101 05/30/2015 0730   CO2 27 05/30/2015 0730   GLUCOSE 119* 05/30/2015 0730   BUN 21* 05/30/2015 0730   CREATININE 1.00 05/30/2015 0730   CALCIUM 9.0 05/30/2015 0730   PROT 4.5* 05/25/2015 0525   ALBUMIN 2.1* 05/25/2015 0525   AST 23 05/25/2015 0525   ALT 21 05/25/2015 0525   ALKPHOS 47 05/25/2015 0525   BILITOT 0.7 05/25/2015 0525   GFRNONAA >60 05/30/2015 0730   GFRAA  >60 05/30/2015 0730   No results found for: CHOL, HDL, LDLCALC, LDLDIRECT, TRIG, CHOLHDL Lab Results  Component Value Date   HGBA1C 5.7* 05/21/2015   Lab Results  Component Value Date   VITAMINB12 186 05/24/2015   No results found for: TSH   05/21/15 CT head [I reviewed images myself and agree with  interpretation. -VRP]  1. No acute intracranial abnormalities. 2. Status post left sided craniotomy. There is a focal area of left lateral frontal lobe encephalomalacia from previous hemorrhage and subsequent surgery. 3. Anterior inferior frontal lobe encephalomalacia likely from remote trauma. 4. Mild atrophy.  05/22/15 TTE  - Upper normal LV wall thickness with LVEF 55-60% and grade 1diastolic dysfunction. Normal estimated filling pressure. Trivialmitral regurgitation. Mildly sclerotic aortic valve. Mildlydilated RV with device wire noted. Trivial tricuspid regurgitation.      ASSESSMENT AND PLAN  65 y.o. year old male here with history of subdural hematoma, traumatic brain injury, status post craniotomy, uncontrolled tobacco abuse, history of hypertension, diabetes, sleep apnea, with questionable medical compliance. Today patient has good insight into his condition but prefers to live alone. He does appear to have some medical decision making capacity at this time. However I agree that he should not be living alone, driving, and would benefit from a more supervised and safe environment. He does have some family support nearby but clearly he is not taking care of himself adequately.   Dx:   1. Transient alteration of awareness      PLAN: - today patient has good insight, memory and decision making capacity; however, he would likely benefit from more safety, supervision and home health agency services - return in 3 months with daughter for further evaluation  Return in about 3 months (around 09/09/2015).    Penni Bombard, MD [id number], [id number] AM Certified in Neurology,  Neurophysiology and Neuroimaging  [facility] [address], [place], [place] [address] [phone]

## 2015-06-12 NOTE — Patient Instructions (Signed)
Thank you for coming to see [place] at [facility]. I hope we have been able to provide you high quality care today.  You may receive a patient satisfaction survey over the next few weeks. We would appreciate your feedback and comments so that we may continue to improve ourselves and the health of our patients.  - return in 3 months with your daughter Anderson [place])   ~~~~~~~~~~~~~~~~~~~~~~~~~~~~~~~~~~~~~~~~~~~~~~~~~~~~~~~~~~~~~~~~~  DR. Kori Goins'S GUIDE TO HAPPY AND HEALTHY LIVING These are some of my general health and wellness recommendations. Some of them may apply to you better than others. Please use common sense as you try these suggestions and feel free to ask me any questions.   ACTIVITY/FITNESS Mental, social, emotional and physical stimulation are very important for brain and body health. Try learning a new activity (arts, music, language, sports, games).  Keep moving your body to the best of your abilities. You can do this at home, inside or outside, the park, community center, gym or anywhere you like. Consider a physical therapist or personal trainer to get started. Consider the app Sworkit. Fitness trackers such as smart-watches, smart-phones or Fitbits can help as well.   NUTRITION Eat more plants: colorful vegetables, nuts, seeds and berries.  Eat less sugar, salt, preservatives and processed foods.  Avoid toxins such as cigarettes and alcohol.  Drink water when you are thirsty. Warm water with a slice of lemon is an excellent morning drink to start the day.  Consider these websites for more information The Nutrition Source (https://www.henry-hernandez.biz/) Precision Nutrition (WindowBlog.ch)   RELAXATION Consider practicing mindfulness meditation or other relaxation techniques such as deep breathing, prayer, yoga, tai chi, massage. See website mindful.org or the apps Headspace or Calm to help get  started.   SLEEP Try to get at least 7-8+ hours sleep per day. Regular exercise and reduced caffeine will help you sleep better. Practice good sleep hygeine techniques. See website sleep.org for more information.   PLANNING Prepare estate planning, living will, healthcare POA documents. Sometimes this is best planned with the help of an attorney. Theconversationproject.org and agingwithdignity.org are excellent resources.

## 2015-07-01 ENCOUNTER — Ambulatory Visit: Payer: Self-pay | Admitting: Nurse Practitioner

## 2015-07-15 ENCOUNTER — Ambulatory Visit (INDEPENDENT_AMBULATORY_CARE_PROVIDER_SITE_OTHER): Payer: Medicare Other | Admitting: Nurse Practitioner

## 2015-07-15 ENCOUNTER — Other Ambulatory Visit: Payer: Self-pay | Admitting: Internal Medicine

## 2015-07-15 ENCOUNTER — Encounter: Payer: Self-pay | Admitting: Nurse Practitioner

## 2015-07-15 VITALS — BP 107/61 | HR 87 | Temp 99.5°F | Ht 68.0 in | Wt 210.4 lb

## 2015-07-15 DIAGNOSIS — K922 Gastrointestinal hemorrhage, unspecified: Secondary | ICD-10-CM

## 2015-07-15 DIAGNOSIS — K253 Acute gastric ulcer without hemorrhage or perforation: Secondary | ICD-10-CM | POA: Diagnosis not present

## 2015-07-15 DIAGNOSIS — I739 Peripheral vascular disease, unspecified: Secondary | ICD-10-CM

## 2015-07-15 DIAGNOSIS — K259 Gastric ulcer, unspecified as acute or chronic, without hemorrhage or perforation: Secondary | ICD-10-CM | POA: Insufficient documentation

## 2015-07-15 DIAGNOSIS — K264 Chronic or unspecified duodenal ulcer with hemorrhage: Secondary | ICD-10-CM | POA: Diagnosis not present

## 2015-07-15 NOTE — Assessment & Plan Note (Signed)
Duodenal ulcer with hemorrhage acutely in the hospital, has resolved since. No signs of anemia or recurrent bleeding. We'll need surveillance endoscopy in the near future.

## 2015-07-15 NOTE — Assessment & Plan Note (Signed)
Noted nonbleeding gastric ulcer in the antrum, will need surveillance endoscopy to check for healing. Has been on an inadvertent NSAIDs including Alka-Seltzer and Excedrin. Advised him these have aspirin and Namenda stopped taking, he is agreed. Can only take Tylenol at this point. Return for follow-up in 6 weeks.

## 2015-07-15 NOTE — Progress Notes (Signed)
Referring Provider: [name], MD Primary Care Physician:  [name], MD Primary GI:  Dr. Oneida Alar  Chief Complaint  Patient presents with  . Follow-up    hosptial follow up. doing well    HPI:   Dustin Estes. is a 65 y.o. male who presents for follow-up on hospitalization for upper GI bleed. Patient initially presented to the emergency department on 05/22/2015 with family for encephalopathy and large melena stools. He initially received protonic strip and 2 units of red blood cells which brought his admission hemoglobin of 7.6 up to 9.8. He underwent endoscopy on 05/22/2015 which found a large nonbleeding gastric ulcer and a large actively bleeding duodenal ulcer with arterial bleeding in which bleeding was controlled with epinephrine injection, direct pressure, cautery, and 2 hemostasis clips. He was subsequently transferred to [facility] hospital stepdown as per caution for hypotension and consideration of possible IR embolization or surgery for any repeated bleeding. He received a third unit of packed red blood cells in transit. He continued to progress well with downward trend in BUN, improving encephalopathy, hemodynamic stability. Noted some mild downward drift in his hemoglobin without noted active ongoing gross bleeding and on discharge his hemoglobin was 8.3. All of CBC on 05/30/15 showed improvement in hemoglobin to 9.8.  Patient is a difficult historian. Today he states he's feeling pretty good. Has been taking Protonix daily. Denies hematochezia, melena, N/V, abdominal pain. Denies chest pain, dyspnea, dizziness, lightheadedness, syncope, near syncope. Denies any other upper or lower GI symptoms.   Does not take ASA powder, NSAIDs. Still takes Excedrine, was unaware of it having ASA. Advised to stop and change to Tylenol. Also still takes Alka-Seltzer once every 2-3 weeks. Advised ceasing Alka-Seltzer as well.  Past Medical History  Diagnosis Date  . Dysrhythmia   .  Diabetes mellitus   . 2Nd degree AV block 04/29/2011    Medtronic Adapta  . PAD (peripheral artery disease) ([place])   . Abdominal aortic aneurysm (HCC)     4.1 x 4.3 cm  . Hypertension   . Obesity   . Tobacco abuse   . OSA (obstructive sleep apnea)   . Encephalomalacia on imaging study   . AAA (abdominal aortic aneurysm) ([place]) 12/10/2012  . SVT (supraventricular tachycardia) ([place]) 04/30/2011  . Second degree AV block, Mobitz type II 03/19/2014  . Paroxysmal atrial fibrillation ([place]) 03/19/2014    Asymptomatic, pacemaker detected     Past Surgical History  Procedure Laterality Date  . Permanent pacemaker insertion  04/29/2011    Medtronic Adapta  . Brain surgery    . Appendectomy  12/69  . Bladder surgery  9/04,11/05,9/07    tumor removal  . US echocardiography  04/13/2010    mild asymmetric LVH, EF >55%,mild mitral ca+,AOV mildly sclerotic  . Nm myocar perf wall motion  04/13/2010    moderate, mostly fixed  inferoseptal defect, suspicious for artifact.  . Permanent pacemaker insertion N/A 04/29/2011    Procedure: PERMANENT PACEMAKER INSERTION;  Surgeon: Sanda Klein, MD;  Location: [facility] CATH LAB;  Service: Cardiovascular;  Laterality: N/A;  . Loop recorder explant  04/29/2011    Procedure: LOOP RECORDER EXPLANT;  Surgeon: Sanda Klein, MD;  Location: [facility] CATH LAB;  Service: Cardiovascular;;  . Esophagogastroduodenoscopy N/A 05/22/2015    [id number] duodenal ulcer/single ulcer in the gastris antrum/erosive gastritis    Current Outpatient Prescriptions  Medication Sig Dispense Refill  . cyanocobalamin 500 MCG tablet Take 1 tablet (500 mcg total) by mouth daily.  30 tablet 2  . fenofibrate 160 MG tablet Take 160 mg by mouth at bedtime.      . Flaxseed, Linseed, 1000 MG CAPS Take 1 capsule by mouth 2 (two) times daily.     Marland Kitchen glipiZIDE (GLUCOTROL) 10 MG tablet Take 10 mg by mouth every morning.      . metFORMIN (GLUCOPHAGE) 1000 MG tablet Take 1,000 mg by mouth 2 (two) times daily with a  meal.     . metoprolol tartrate (LOPRESSOR) 25 MG tablet Take 0.5 tablets (12.5 mg total) by mouth 2 (two) times daily. 60 tablet 2  . Multiple Vitamin (MULITIVITAMIN WITH MINERALS) TABS Take 1 tablet by mouth at bedtime. Centrum Silver    . niacin 500 MG tablet Take 500 mg by mouth 2 (two) times daily with a meal.      . pantoprazole (PROTONIX) 40 MG tablet Take 1 tablet (40 mg total) by mouth 2 (two) times daily. 60 tablet 2  . simvastatin (ZOCOR) 40 MG tablet Take 40 mg by mouth at bedtime.      No current facility-administered medications for this visit.    Allergies as of 07/15/2015  . (No Known Allergies)    Family History  Problem Relation Age of Onset  . Diabetes Mother   . Diabetes Father   . Stroke Father     Social History   Social History  . Marital Status: Divorced    Spouse Name: N/A  . Number of Children: 2  . Years of Education: GED   Occupational History  .      retired, owned Sales promotion account executive, [employer]  . Smoking status: Current Every Day Smoker -- 2.00 packs/day for 47 years    Types: Cigarettes  . Smokeless tobacco: Never Used     Comment: 4 (10packs per caroton) cartons/month (01/13/14)  . Alcohol Use: Yes     Comment: "hardly ever"  . Drug Use: No  . Sexual Activity: No   Other Topics Concern  . None   Social History Narrative   Single, lives alone   Caffeine use- coffee 2 cups/week    Review of Systems: 10-point ROS negative except as per HPI.   Physical Exam: BP 107/61 mmHg  Pulse 87  Temp(Src) 99.5 F (37.5 C) (Oral)  Ht 5\' 8"  (1.727 m)  Wt 210 lb 6.4 oz (95.437 kg)  BMI 32.00 kg/m2 General:   Alert and oriented. Pleasant and cooperative. Well-nourished and well-developed.  Eyes:  Without icterus, sclera clear and conjunctiva pink.  Cardiovascular:  S1, S2 present without murmurs appreciated. Extremities without clubbing or edema. Respiratory:  Clear to auscultation bilaterally. No wheezes, rales, or  rhonchi. No distress.  Gastrointestinal:  +BS, soft, non-tender and non-distended. No HSM noted. No guarding or rebound. No masses appreciated.  Rectal:  Deferred  Musculoskalatal:  Symmetrical without gross deformities. Skin:  Intact without significant lesions or rashes. Neurologic:  Alert and oriented x4;  grossly normal neurologically. Psych:  Alert and cooperative. Normal mood and affect. Heme/Lymph/Immune: No excessive bruising noted.    07/15/2015 2:48 PM   Disclaimer: This note was dictated with voice recognition software. Similar sounding words can inadvertently be transcribed and may not be corrected upon review.

## 2015-07-15 NOTE — Assessment & Plan Note (Signed)
Patient with recent hospital admission for significant GI bleed of a large arterial bleeding duodenal ulcer. Also with a nonbleeding gastric ulcer. Hemostasis achieved with epinephrine, hemostasis clips, cautery. Transferred to [facility] as a precaution for possible need of IR embolization. No further bleeding and Dustin Estes and was discharged successfully on 06/01/2015. Presents today for further evaluation. No noted bleeding, no symptoms of anemia. Last CBC showed significant improvement in hemoglobin. Today I will recheck CBC and BMP. Advised him to stop taking all NSAIDs including Excedrin and Alka-Seltzer. Can only take Tylenol without checking first. He will need surveillance EGD likely in the next 1-2 months, we'll discuss with Dr. Oneida Alar for preferred timing given that he has remained on NSAIDs. Return for follow-up in 6 weeks.

## 2015-07-15 NOTE — Patient Instructions (Signed)
1. Have your blood work drawn as soon as you can. 2. Stop taking all medications that have "NSAID" on the bottle, including ibuprofen, Advil, Motrin, naproxen, Naprosyn, Excedrin, Alka-Seltzer, and others. At this point you can only take Tylenol for headaches. Before trying another over-the-counter medication, call us for approval. 3. We will need to repeat your endoscopy to check for healing of your ulcers. I will check with Dr. Oneida Alar on when she would like this done and we will call you back. 4. In the meantime, return for follow-up in 6 weeks.

## 2015-07-16 NOTE — Progress Notes (Signed)
cc'ed to pcp °

## 2015-07-28 ENCOUNTER — Ambulatory Visit (HOSPITAL_COMMUNITY)
Admission: RE | Admit: 2015-07-28 | Discharge: 2015-07-28 | Disposition: A | Discharge status: Home / Self Care | Payer: Medicare Other | Source: Ambulatory Visit | Attending: Internal Medicine | Admitting: Internal Medicine

## 2015-07-28 DIAGNOSIS — I739 Peripheral vascular disease, unspecified: Secondary | ICD-10-CM | POA: Diagnosis not present

## 2015-07-28 DIAGNOSIS — I700 Atherosclerosis of aorta: Secondary | ICD-10-CM | POA: Insufficient documentation

## 2015-07-28 DIAGNOSIS — I714 Abdominal aortic aneurysm, without rupture, unspecified: Secondary | ICD-10-CM

## 2015-07-28 DIAGNOSIS — R938 Abnormal findings on diagnostic imaging of other specified body structures: Secondary | ICD-10-CM | POA: Diagnosis not present

## 2015-07-28 DIAGNOSIS — I10 Essential (primary) hypertension: Secondary | ICD-10-CM | POA: Insufficient documentation

## 2015-07-28 DIAGNOSIS — Z720 Tobacco use: Secondary | ICD-10-CM | POA: Diagnosis not present

## 2015-07-28 DIAGNOSIS — E119 Type 2 diabetes mellitus without complications: Secondary | ICD-10-CM | POA: Diagnosis not present

## 2015-07-28 DIAGNOSIS — G4733 Obstructive sleep apnea (adult) (pediatric): Secondary | ICD-10-CM | POA: Insufficient documentation

## 2015-07-30 NOTE — Progress Notes (Signed)
Noted  

## 2015-07-30 NOTE — Progress Notes (Signed)
REVIEWED. TRIAGE FOR EGD LATE MAY 2017. NO NEED FOR OPV PRIOR TO EGD.

## 2015-07-31 ENCOUNTER — Other Ambulatory Visit: Payer: Self-pay

## 2015-07-31 DIAGNOSIS — I714 Abdominal aortic aneurysm, without rupture, unspecified: Secondary | ICD-10-CM

## 2015-07-31 DIAGNOSIS — I739 Peripheral vascular disease, unspecified: Secondary | ICD-10-CM

## 2015-08-26 ENCOUNTER — Encounter: Payer: Self-pay | Admitting: Nurse Practitioner

## 2015-08-26 ENCOUNTER — Ambulatory Visit (INDEPENDENT_AMBULATORY_CARE_PROVIDER_SITE_OTHER): Payer: Medicare Other | Admitting: Nurse Practitioner

## 2015-08-26 ENCOUNTER — Other Ambulatory Visit: Payer: Self-pay

## 2015-08-26 VITALS — BP 107/63 | HR 85 | Temp 99.1°F | Ht 68.0 in | Wt 232.2 lb

## 2015-08-26 DIAGNOSIS — K922 Gastrointestinal hemorrhage, unspecified: Secondary | ICD-10-CM | POA: Diagnosis not present

## 2015-08-26 DIAGNOSIS — K264 Chronic or unspecified duodenal ulcer with hemorrhage: Secondary | ICD-10-CM | POA: Diagnosis not present

## 2015-08-26 DIAGNOSIS — Z1211 Encounter for screening for malignant neoplasm of colon: Secondary | ICD-10-CM

## 2015-08-26 DIAGNOSIS — K253 Acute gastric ulcer without hemorrhage or perforation: Secondary | ICD-10-CM

## 2015-08-26 NOTE — Assessment & Plan Note (Signed)
UGI bleed due to ulcers as noted below. H/H improved with recent labs. Asymptomatic at this time. Will proceed with surveillance EGD as per below.

## 2015-08-26 NOTE — Assessment & Plan Note (Signed)
The patient has not had a colonoscopy in approximately 14 years. He is currently due. We will add this on to his needed endoscopy for ulcer surveillance in order to complete colorectal cancer screening.

## 2015-08-26 NOTE — Patient Instructions (Signed)
1. We will schedule your procedures for you. 2. Keep taking Protonix (pantoprazole) twice a day. 3. Return for follow-up based on postprocedure recommendations.

## 2015-08-26 NOTE — Assessment & Plan Note (Signed)
Inpatient EGD with gastric ulcer. Has been on bid Protonix and continues. No NSAIDs or ASA powders. Is due for surveillance EGD and we will arrange this.  Proceed with EGD with Dr. Oneida Alar in near future: the risks, benefits, and alternatives have been discussed with the patient in detail. The patient states understanding and desires to proceed.  The patient is not on any anticoagulants, anxiolytics, chronic pain medications, or antidepressants. Conscious sedation should be adequate for his procedure as it was for his last procedure.

## 2015-08-26 NOTE — Progress Notes (Signed)
Referring Provider: [name], MD Primary Care Physician:  [name], MD Primary GI:  Dr. Oneida Alar  Chief Complaint  Patient presents with  . Follow-up  . set up EGD    HPI:   Dustin Estes. is a 65 y.o. male who presents for follow-up on upper GI bleed, duodenal ulcer, gastric ulcer status post recent endoscopy. Also needs repeat endoscopy scheduled. He was last seen in our office 07/15/2015 at which point he stated he was feeling well, although he is a poor historian. Denies NSAIDs and aspirin use although did admit to taking Excedrin which has aspirin in it. He was advised to stop this and changed to Tylenol. Also taking Alka-Seltzer every 2-3 weeks, also advised stopping this as well. Recommended repeat endoscopy late May 2017.  Today he states he's doing well overall. Denies abdominal pain, N/V, hematemesis, melena, fever, chills, unintentional weight loss. Denies chest pain, dyspnea, dizziness, lightheadedness, syncope, near syncope. Denies any other upper or lower GI symptoms.  Has stopped Excedrin and Alka Seltzer, now only takes Tylenol if needed. He brought in labs today that were drawn on 08/20/15 with H/H notable at 10.4/32.7 (compared to 9.8/30.6 on 05/30/15).   Today he also notes he hasn't had a colonoscopy in about 14 years.   Past Medical History  Diagnosis Date  . Dysrhythmia   . Diabetes mellitus   . 2Nd degree AV block 04/29/2011    Medtronic Adapta  . PAD (peripheral artery disease) ([place])   . Abdominal aortic aneurysm (HCC)     4.1 x 4.3 cm  . Hypertension   . Obesity   . Tobacco abuse   . OSA (obstructive sleep apnea)   . Encephalomalacia on imaging study   . AAA (abdominal aortic aneurysm) ([place]) 12/10/2012  . SVT (supraventricular tachycardia) ([place]) 04/30/2011  . Second degree AV block, Mobitz type II 03/19/2014  . Paroxysmal atrial fibrillation ([place]) 03/19/2014    Asymptomatic, pacemaker detected     Past Surgical History  Procedure  Laterality Date  . Permanent pacemaker insertion  04/29/2011    Medtronic Adapta  . Brain surgery    . Appendectomy  12/69  . Bladder surgery  9/04,11/05,9/07    tumor removal  . US echocardiography  04/13/2010    mild asymmetric LVH, EF >55%,mild mitral ca+,AOV mildly sclerotic  . Nm myocar perf wall motion  04/13/2010    moderate, mostly fixed  inferoseptal defect, suspicious for artifact.  . Permanent pacemaker insertion N/A 04/29/2011    Procedure: PERMANENT PACEMAKER INSERTION;  Surgeon: Sanda Klein, MD;  Location: [facility];  Service: Cardiovascular;  Laterality: N/A;  . Loop recorder explant  04/29/2011    Procedure: LOOP RECORDER EXPLANT;  Surgeon: Sanda Klein, MD;  Location: [facility];  Service: Cardiovascular;;  . Esophagogastroduodenoscopy N/A 05/22/2015    [id number] duodenal ulcer/single ulcer in the gastris antrum/erosive gastritis    Current Outpatient Prescriptions  Medication Sig Dispense Refill  . cyanocobalamin 500 MCG tablet Take 1 tablet (500 mcg total) by mouth daily. 30 tablet 2  . fenofibrate 160 MG tablet Take 160 mg by mouth at bedtime.      . Flaxseed, Linseed, 1000 MG CAPS Take 1 capsule by mouth 2 (two) times daily.     Marland Kitchen glipiZIDE (GLUCOTROL) 10 MG tablet Take 10 mg by mouth every morning.      . metFORMIN (GLUCOPHAGE) 1000 MG tablet Take 1,000 mg by mouth 2 (two) times daily with a meal.     .  metoprolol tartrate (LOPRESSOR) 25 MG tablet Take 0.5 tablets (12.5 mg total) by mouth 2 (two) times daily. 60 tablet 2  . Multiple Vitamin (MULITIVITAMIN WITH MINERALS) TABS Take 1 tablet by mouth at bedtime. Centrum Silver    . niacin 500 MG tablet Take 500 mg by mouth 2 (two) times daily with a meal.      . pantoprazole (PROTONIX) 40 MG tablet Take 1 tablet (40 mg total) by mouth 2 (two) times daily. 60 tablet 2  . simvastatin (ZOCOR) 40 MG tablet Take 40 mg by mouth at bedtime.      No current facility-administered medications for this visit.     Allergies as of 08/26/2015  . (No Known Allergies)    Family History  Problem Relation Age of Onset  . Diabetes Mother   . Diabetes Father   . Stroke Father     Social History   Social History  . Marital Status: Divorced    Spouse Name: N/A  . Number of Children: 2  . Years of Education: GED   Occupational History  .      retired, owned Sales promotion account executive, [employer]  . Smoking status: Current Every Day Smoker -- 2.00 packs/day for 47 years    Types: Cigarettes  . Smokeless tobacco: Never Used     Comment: 4 (10packs per caroton) cartons/month (01/13/14)  . Alcohol Use: Yes     Comment: "hardly ever"  . Drug Use: No  . Sexual Activity: No   Other Topics Concern  . None   Social History Narrative   Single, lives alone   Caffeine use- coffee 2 cups/week    Review of Systems: 10-point ROS negative except as per HPI.    Physical Exam: BP 107/63 mmHg  Pulse 85  Temp(Src) 99.1 F (37.3 C)  Ht 5\' 8"  (1.727 m)  Wt 232 lb 3.2 oz (105.325 kg)  BMI 35.31 kg/m2 General:   Alert and oriented. Pleasant and cooperative. Well-nourished and well-developed.  Head:  Normocephalic and atraumatic. Eyes:  Without icterus, sclera clear and conjunctiva pink.  Ears:  Normal auditory acuity. Cardiovascular:  S1, S2 present without murmurs appreciated. Extremities without clubbing or edema. Respiratory:  Clear to auscultation bilaterally. No rales, or rhonchi. No distress. Minimal wheezing (states he gets it in the spring and fall related to seasonal allergies). Gastrointestinal:  +BS, soft, non-tender and non-distended. No HSM noted. No guarding or rebound. No masses appreciated.  Rectal:  Deferred  Neurologic:  Alert and oriented x4;  grossly normal neurologically. Psych:  Alert and cooperative. Normal mood and affect. Heme/Lymph/Immune: No significant cervical adenopathy. No excessive bruising noted.    08/26/2015 2:21 PM   Disclaimer: This  note was dictated with voice recognition software. Similar sounding words can inadvertently be transcribed and may not be corrected upon review.

## 2015-08-26 NOTE — Assessment & Plan Note (Signed)
Significant duodenal ulcer as inpatient with active bleeding requiring epinephrine and coagulation. Recent CBC shows improvement in H/H as per HPI. Is now due for surveillance EGD. No NSAIDs/ASA powders, no symptoms. Will proceed with EGD as noted below.

## 2015-08-26 NOTE — Progress Notes (Signed)
cc'ed to pcp °

## 2015-08-28 ENCOUNTER — Ambulatory Visit (INDEPENDENT_AMBULATORY_CARE_PROVIDER_SITE_OTHER): Payer: Medicare Other | Admitting: Internal Medicine

## 2015-08-28 ENCOUNTER — Encounter: Payer: Self-pay | Admitting: Internal Medicine

## 2015-08-28 VITALS — BP 130/60 | HR 87 | Ht 68.0 in | Wt 230.0 lb

## 2015-08-28 DIAGNOSIS — I471 Supraventricular tachycardia: Secondary | ICD-10-CM | POA: Diagnosis not present

## 2015-08-28 DIAGNOSIS — I739 Peripheral vascular disease, unspecified: Secondary | ICD-10-CM

## 2015-08-28 DIAGNOSIS — G4733 Obstructive sleep apnea (adult) (pediatric): Secondary | ICD-10-CM

## 2015-08-28 DIAGNOSIS — I714 Abdominal aortic aneurysm, without rupture, unspecified: Secondary | ICD-10-CM | POA: Insufficient documentation

## 2015-08-28 DIAGNOSIS — I480 Paroxysmal atrial fibrillation: Secondary | ICD-10-CM | POA: Diagnosis not present

## 2015-08-28 DIAGNOSIS — Z950 Presence of cardiac pacemaker: Secondary | ICD-10-CM

## 2015-08-28 NOTE — Progress Notes (Signed)
OFFICE NOTE  Chief Complaint:  Routine office visit  Primary Care Physician: [name], MD  HPI:  Dustin Estes. is a 65 year old gentleman with a history of unexplained syncope, ultimately found to have high-degree AV block with a loop recorder. He underwent explantation and permanent pacemaker placement in January of 2013 and has done fairly well since then and has had no further syncopal events. He also has an abdominal aortic aneurysm which was recently reassessed by ultrasound and is fairly stable, measuring 4.1 x 4.3 cm. Unfortunately he has bilateral lower extremity arterial disease with SFA narrowing of 50-69%. His right ABI is 0.84, his left ABI is 0.76. At this point he is not having any symptoms of claudication, however, we recommended we repeat the studies in 1 year. Unfortunately he also has had continued tobacco dependence and smoking now 3 packs a day. We discussed that this probably amounts to about $100 a week, or $400 a month, which is approximately one-third of his monthly Social Security check. Despite that he continues to smoke. Finally his blood pressure was noted to be low today, measuring 80/56 and a third recheck indicated it was about 85/56.  At his last visit I decreased his blood pressure medications and he reported his blood pressure is now better. He also saw Dr. Sallyanne Kuster recently who felt like his pacemaker was working appropriately.  Dustin Estes returns today for followup. He had recent abdominal Dopplers which show a small increase in his abdominal aortic aneurysm to 4.5 by 4.6 cm. he's had slight worsening in his peripheral Dopplers however denies any claudication. He's had no further syncopal episodes since he had a pacemaker placed. Unfortunately continues to smoke. Blood pressure is well controlled.  08/28/2015  Dustin Estes returns today for follow-up. He reports no new symptoms. He last had his pacemaker checked last summer. He was enrolled in a remote  device clinic but unfortunately he got a new device and says he does not have a landline, therefore he is not on any remote transmissions over the past year. He's not scheduled to see Dr. Loletha Grayer back in the pacemaker clinic until August. He denies any palpitations or racing heart. He said no further syncopal episodes. We recently did repeat ultrasounds of his abdominal aortic aneurysm which measures less than 4.5 cm. Lower extremity arterial Dopplers are stable with a ABI of 0.8 bilaterally. Blood pressure is well-controlled today 130/60. He's managed to lose about 6 pounds of weight.  PMHx:  Past Medical History  Diagnosis Date  . Dysrhythmia   . Diabetes mellitus   . 2Nd degree AV block 04/29/2011    Medtronic Adapta  . PAD (peripheral artery disease) ([place])   . Abdominal aortic aneurysm (HCC)     4.1 x 4.3 cm  . Hypertension   . Obesity   . Tobacco abuse   . OSA (obstructive sleep apnea)   . Encephalomalacia on imaging study   . AAA (abdominal aortic aneurysm) ([place]) 12/10/2012  . SVT (supraventricular tachycardia) ([place]) 04/30/2011  . Second degree AV block, Mobitz type II 03/19/2014  . Paroxysmal atrial fibrillation ([place]) 03/19/2014    Asymptomatic, pacemaker detected     Past Surgical History  Procedure Laterality Date  . Permanent pacemaker insertion  04/29/2011    Medtronic Adapta  . Brain surgery    . Appendectomy  12/69  . Bladder surgery  9/04,11/05,9/07    tumor removal  . US echocardiography  04/13/2010    mild asymmetric LVH,  EF >55%,mild mitral ca+,AOV mildly sclerotic  . Nm myocar perf wall motion  04/13/2010    moderate, mostly fixed  inferoseptal defect, suspicious for artifact.  . Permanent pacemaker insertion N/A 04/29/2011    Procedure: PERMANENT PACEMAKER INSERTION;  Surgeon: Sanda Klein, MD;  Location: [facility];  Service: Cardiovascular;  Laterality: N/A;  . Loop recorder explant  04/29/2011    Procedure: LOOP RECORDER EXPLANT;  Surgeon: Sanda Klein, MD;   Location: [facility];  Service: Cardiovascular;;  . Esophagogastroduodenoscopy N/A 05/22/2015    [id number] duodenal ulcer/single ulcer in the gastris antrum/erosive gastritis    FAMHx:  Family History  Problem Relation Age of Onset  . Diabetes Mother   . Diabetes Father   . Stroke Father     SOCHx:   reports that he has been smoking Cigarettes.  He has a 94 pack-year smoking history. He has never used smokeless tobacco. He reports that he drinks alcohol. He reports that he does not use illicit drugs.  ALLERGIES:  No Known Allergies  ROS: Pertinent items noted in HPI and remainder of comprehensive ROS otherwise negative.  HOME MEDS: Current Outpatient Prescriptions  Medication Sig Dispense Refill  . cyanocobalamin 500 MCG tablet Take 1 tablet (500 mcg total) by mouth daily. 30 tablet 2  . fenofibrate 160 MG tablet Take 160 mg by mouth at bedtime.      . Flaxseed, Linseed, 1000 MG CAPS Take 1 capsule by mouth 2 (two) times daily.     Marland Kitchen glipiZIDE (GLUCOTROL) 10 MG tablet Take 10 mg by mouth every morning.      . metFORMIN (GLUCOPHAGE) 1000 MG tablet Take 1,000 mg by mouth 2 (two) times daily with a meal.     . metoprolol tartrate (LOPRESSOR) 25 MG tablet Take 0.5 tablets (12.5 mg total) by mouth 2 (two) times daily. 60 tablet 2  . Multiple Vitamin (MULITIVITAMIN WITH MINERALS) TABS Take 1 tablet by mouth at bedtime. Centrum Silver    . niacin 500 MG tablet Take 500 mg by mouth 2 (two) times daily with a meal.      . pantoprazole (PROTONIX) 40 MG tablet Take 1 tablet (40 mg total) by mouth 2 (two) times daily. 60 tablet 2  . quinapril (ACCUPRIL) 20 MG tablet Take 20 mg by mouth at bedtime.    . simvastatin (ZOCOR) 40 MG tablet Take 40 mg by mouth at bedtime.      No current facility-administered medications for this visit.    LABS/IMAGING: No results found for this or any previous visit (from the past 48 hour(s)). No results found.  VITALS: BP 130/60 mmHg  Pulse 87  Ht 5\' 8"   (1.727 m)  Wt 230 lb (104.327 kg)  BMI 34.98 kg/m2  EXAM: Gen: Awake, NAD HEENT: PERRLA Lungs: clear CV: RRR Abd: Obese, soft, non-tender EXT: no edema Neuro: Non-focal Psych: Normal  EKG: Sinus rhythm at 87 with RBBB and occasional PVC's  ASSESSMENT: 1. Syncope status post dual-chamber pacemaker 2. Obstructive sleep apnea 3. Obesity 4. Diabetes type 2 5. Dyslipidemia 6. Hypertension 7. PAD - Bilateral ABI's at 0.8 8. Stable aortic aneurysm measuring <4.5 cm 9. Tobacco dependence  PLAN: 1.   Dustin Estes is to be doing fairly well without any further chest pain or worsening shortness of breath. He's had no further syncopal episodes since the placement of his pacemaker. He does have an infrarenal aneurysm which is slightly enlarged to 4.5 x 4.6 cm and will need monitoring. He also has  bilateral PAD without lifestyle limiting claudication. I again encouraged him to stop smoking and spent more than 5 minutes talking to him about smoking cessation. He again reassures me that he would be working on it. We'll plan to see him back annually and he'll be followed by Dr. Loletha Grayer for his pacemaker. He will need to be enrolled again in remote pacemaker checks.  Pixie Casino, MD, [facility] Attending Cardiologist [place] Hilty 08/28/2015, 5:57 PM

## 2015-08-28 NOTE — Patient Instructions (Signed)
Your physician recommends that you schedule a follow-up appointment with [facility] @ [address] -- please take your home device kit to this appointment   Your physician wants you to follow-up in: 12 months with Dr. Debara Pickett. You will receive a reminder letter in the mail two months in advance. If you don't receive a letter, please call our office to schedule the follow-up appointment.

## 2015-08-31 ENCOUNTER — Telehealth: Payer: Self-pay | Admitting: Internal Medicine

## 2015-08-31 DIAGNOSIS — Z79899 Other long term (current) drug therapy: Secondary | ICD-10-CM

## 2015-08-31 NOTE — Telephone Encounter (Signed)
Patient called and explained need for BMET. Patient voiced understanding. He states his PCP put him back on his quinapril recently.  BMET ordered. Patient states he will have the test done next Wednesday when he is in [facility] for another MD appt.  Explained location of lab in Dr. Lysbeth Penner building.

## 2015-08-31 NOTE — Telephone Encounter (Signed)
-----   Message from Pixie Casino, MD sent at 08/31/2015 10:58 AM EDT ----- Dustin Estes .Dustin Estes I think his PCP changed the dose or something and wanted [place] to check his renal function.  Dr. Lemmie Evens  ----- Message -----    From: Fidel Levy, RN    Sent: 08/31/2015   8:45 AM      To: Pixie Casino, MD  He is on quinapril per our list, and has been on this for a while.Dustin Estes   ----- Message -----    From: Pixie Casino, MD    Sent: 08/28/2015   6:28 PM      To: Fidel Levy, RN  PCP wanted [place] to check a BMET - recently started on lisinopril. Can you order one and have him get it next week?  Dr. Lemmie Evens

## 2015-09-07 ENCOUNTER — Encounter (HOSPITAL_COMMUNITY)
Admission: RE | Disposition: A | Discharge status: Home / Self Care | Payer: Self-pay | Source: Ambulatory Visit | Attending: Gastroenterology

## 2015-09-07 ENCOUNTER — Ambulatory Visit (HOSPITAL_COMMUNITY)
Admission: RE | Admit: 2015-09-07 | Discharge: 2015-09-07 | Disposition: A | Discharge status: Home / Self Care | Payer: Medicare Other | Source: Ambulatory Visit | Attending: Gastroenterology | Admitting: Gastroenterology

## 2015-09-07 ENCOUNTER — Encounter (HOSPITAL_COMMUNITY): Payer: Self-pay | Admitting: *Deleted

## 2015-09-07 DIAGNOSIS — Z79899 Other long term (current) drug therapy: Secondary | ICD-10-CM | POA: Insufficient documentation

## 2015-09-07 DIAGNOSIS — K635 Polyp of colon: Secondary | ICD-10-CM | POA: Diagnosis not present

## 2015-09-07 DIAGNOSIS — F17210 Nicotine dependence, cigarettes, uncomplicated: Secondary | ICD-10-CM | POA: Insufficient documentation

## 2015-09-07 DIAGNOSIS — K3189 Other diseases of stomach and duodenum: Secondary | ICD-10-CM | POA: Diagnosis not present

## 2015-09-07 DIAGNOSIS — I714 Abdominal aortic aneurysm, without rupture: Secondary | ICD-10-CM | POA: Diagnosis not present

## 2015-09-07 DIAGNOSIS — Z950 Presence of cardiac pacemaker: Secondary | ICD-10-CM | POA: Insufficient documentation

## 2015-09-07 DIAGNOSIS — Z09 Encounter for follow-up examination after completed treatment for conditions other than malignant neoplasm: Secondary | ICD-10-CM | POA: Insufficient documentation

## 2015-09-07 DIAGNOSIS — Z1211 Encounter for screening for malignant neoplasm of colon: Secondary | ICD-10-CM

## 2015-09-07 DIAGNOSIS — E669 Obesity, unspecified: Secondary | ICD-10-CM | POA: Insufficient documentation

## 2015-09-07 DIAGNOSIS — D124 Benign neoplasm of descending colon: Secondary | ICD-10-CM

## 2015-09-07 DIAGNOSIS — D120 Benign neoplasm of cecum: Secondary | ICD-10-CM

## 2015-09-07 DIAGNOSIS — D125 Benign neoplasm of sigmoid colon: Secondary | ICD-10-CM

## 2015-09-07 DIAGNOSIS — Z6834 Body mass index (BMI) 34.0-34.9, adult: Secondary | ICD-10-CM | POA: Diagnosis not present

## 2015-09-07 DIAGNOSIS — I480 Paroxysmal atrial fibrillation: Secondary | ICD-10-CM | POA: Insufficient documentation

## 2015-09-07 DIAGNOSIS — Z8711 Personal history of peptic ulcer disease: Secondary | ICD-10-CM | POA: Insufficient documentation

## 2015-09-07 DIAGNOSIS — I10 Essential (primary) hypertension: Secondary | ICD-10-CM | POA: Insufficient documentation

## 2015-09-07 DIAGNOSIS — K2970 Gastritis, unspecified, without bleeding: Secondary | ICD-10-CM

## 2015-09-07 DIAGNOSIS — D122 Benign neoplasm of ascending colon: Secondary | ICD-10-CM | POA: Diagnosis not present

## 2015-09-07 DIAGNOSIS — Q439 Congenital malformation of intestine, unspecified: Secondary | ICD-10-CM | POA: Insufficient documentation

## 2015-09-07 DIAGNOSIS — Z7984 Long term (current) use of oral hypoglycemic drugs: Secondary | ICD-10-CM | POA: Diagnosis not present

## 2015-09-07 DIAGNOSIS — D123 Benign neoplasm of transverse colon: Secondary | ICD-10-CM | POA: Insufficient documentation

## 2015-09-07 DIAGNOSIS — K922 Gastrointestinal hemorrhage, unspecified: Secondary | ICD-10-CM

## 2015-09-07 DIAGNOSIS — G4733 Obstructive sleep apnea (adult) (pediatric): Secondary | ICD-10-CM | POA: Insufficient documentation

## 2015-09-07 DIAGNOSIS — E119 Type 2 diabetes mellitus without complications: Secondary | ICD-10-CM | POA: Insufficient documentation

## 2015-09-07 HISTORY — PX: ESOPHAGOGASTRODUODENOSCOPY: SHX5428

## 2015-09-07 HISTORY — PX: COLONOSCOPY: SHX5424

## 2015-09-07 LAB — GLUCOSE, CAPILLARY: Glucose-Capillary: 139 mg/dL — ABNORMAL HIGH (ref 65–99)

## 2015-09-07 SURGERY — COLONOSCOPY
Anesthesia: Moderate Sedation

## 2015-09-07 MED ORDER — MIDAZOLAM HCL 5 MG/5ML IJ SOLN
INTRAMUSCULAR | Status: DC | PRN
  Administered 2015-09-07: 2 mg via INTRAVENOUS
  Administered 2015-09-07: 1 mg via INTRAVENOUS
  Administered 2015-09-07 (×2): 2 mg via INTRAVENOUS

## 2015-09-07 MED ORDER — LIDOCAINE VISCOUS 2 % MT SOLN
OROMUCOSAL | Status: DC | PRN
  Administered 2015-09-07: 1 via OROMUCOSAL

## 2015-09-07 MED ORDER — SODIUM CHLORIDE 0.9 % IV SOLN
INTRAVENOUS | Status: DC
  Administered 2015-09-07: 14:00:00 via INTRAVENOUS

## 2015-09-07 MED ORDER — MEPERIDINE HCL 100 MG/ML IJ SOLN
INTRAMUSCULAR | Status: AC
  Filled 2015-09-07: qty 2

## 2015-09-07 MED ORDER — MEPERIDINE HCL 100 MG/ML IJ SOLN
INTRAMUSCULAR | Status: DC | PRN
  Administered 2015-09-07 (×3): 25 mg via INTRAVENOUS

## 2015-09-07 MED ORDER — LIDOCAINE VISCOUS 2 % MT SOLN
OROMUCOSAL | Status: AC
  Filled 2015-09-07: qty 15

## 2015-09-07 MED ORDER — MIDAZOLAM HCL 5 MG/5ML IJ SOLN
INTRAMUSCULAR | Status: AC
  Filled 2015-09-07: qty 10

## 2015-09-07 NOTE — Op Note (Signed)
[facility] Patient Name: Dustin Estes Procedure Date: 09/07/2015 3:19 PM MRN: [id number] Date of Birth: 05/25/1950 Attending MD: Barney Drain , MD CSN: [id number] Age: 65 Admit Type: Outpatient Procedure:                Upper GI endoscopy Indications:              Surveillance procedure Providers:                Barney Drain, MD, Janeece Riggers, RN, Randa Spike,                            Technician Referring MD:             [name], MD Medicines:                TCS+ Midazolam 1 mg IV Complications:            No immediate complications. Estimated Blood Loss:     Estimated blood loss was minimal. Procedure:                Pre-Anesthesia Assessment:                           - Prior to the procedure, a History and Physical                            was performed, and patient medications and                            allergies were reviewed. The patient's tolerance of                            previous anesthesia was also reviewed. The risks                            and benefits of the procedure and the sedation                            options and risks were discussed with the patient.                            All questions were answered, and informed consent                            was obtained. Prior Anticoagulants: The patient has                            taken no previous anticoagulant or antiplatelet                            agents. ASA Grade Assessment: II - A patient with                            mild systemic disease. After reviewing the risks  and benefits, the patient was deemed in                            satisfactory condition to undergo the procedure.                           After obtaining informed consent, the endoscope was                            passed under direct vision. Throughout the                            procedure, the patient's blood pressure, pulse, and                            oxygen  saturations were monitored continuously. The                            [id number] scope was introduced through the                            mouth, and advanced to the second part of duodenum.                            The upper GI endoscopy was accomplished with ease.                            The patient tolerated the procedure well. Scope In: 3:21:24 PM Scope Out: 3:24:22 PM Total Procedure Duration: 0 hours 2 minutes 58 seconds  Findings:      The examined esophagus was normal.      Diffuse mild inflammation characterized by congestion (edema) and       erythema was found in the stomach.      A moderate post-ulcer deformity was found in the duodenal bulb. Impression:               - Normal esophagus.                           - Gastritis.                           - Duodenal deformity.                           - No specimens collected. Moderate Sedation:      Moderate (conscious) sedation was administered by the endoscopy nurse       and supervised by the endoscopist. The following parameters were       monitored: oxygen saturation, heart rate, blood pressure, and response       to care. Total physician intraservice time was 45 minutes. Recommendation:           - High fiber diet.                           - Continue present medications.                           -  Return to my office in 6 months.                           CONTINUE YOUR WEIGHT LOSS EFFORTS. LOSE 10 MORE LBS.                           DRINK WATER TO KEEP YOUR URINE LIGHT YELLOW.                           AVOID ITEMS THAT TRIGGER GASTRITIS. SEE INFO BELOW.                           FOLLOW A HIGH FIBER/LOW FAT DIET. AVOID ITEMS THAT                            CAUSE BLOATING.                           CONTINUE PROTONIX. TAKE 30 MINUTES PRIOR TO MEALS                            TWICE DAILY.                           Next colonoscopy in 3-5 years.                           - Patient has a contact number  available for                            emergencies. The signs and symptoms of potential                            delayed complications were discussed with the                            patient. Return to normal activities tomorrow.                            Written discharge instructions were provided to the                            patient. Procedure Code(s):        --- Professional ---                           [phone], Esophagogastroduodenoscopy, flexible,                            transoral; diagnostic, including collection of                            specimen(s) by brushing or washing, when performed                            (separate  procedure)                           [id number], Moderate sedation services; each additional                            15 minutes intraservice time                           [id number], Moderate sedation services; each additional                            15 minutes intraservice time                           [id number], Moderate sedation services provided by the                            same physician or other qualified health care                            professional performing a gastrointestinal                            endoscopic service that sedation supports,                            requiring the presence of an independent trained                            observer to assist in the monitoring of the                            patient's level of consciousness and physiological                            status; initial 15 minutes of intra-service time;                            patient age 57 years or older (additional time may                            be reported with [id number], as appropriate) Diagnosis Code(s):        --- Professional ---                           K29.70, Gastritis, unspecified, without bleeding                           K31.89, Other diseases of stomach and duodenum CPT copyright 2016 [employer]. All  rights reserved. The codes documented in this report are preliminary and upon coder review may  be revised to meet current compliance requirements. Barney Drain, MD Barney Drain, MD 09/07/2015 8:55:56 PM This report has been signed electronically. Number of Addenda: 0

## 2015-09-07 NOTE — Discharge Instructions (Signed)
You had six polyps removed. You have internal hemorrhoids & DIVERTICULOSIS IN YOUR LEFT COLON. You have MODERATE gastritis. YOUR ULCERS ARE HEALED.    CONTINUE YOUR WEIGHT LOSS EFFORTS. LOSE 10 MORE LBS.  DRINK WATER TO KEEP YOUR URINE LIGHT YELLOW.  AVOID ITEMS THAT TRIGGER GASTRITIS. SEE INFO BELOW.  FOLLOW A HIGH FIBER/LOW FAT DIET. AVOID ITEMS THAT CAUSE BLOATING.   CONTINUE PROTONIX. TAKE 30 MINUTES PRIOR TO MEALS TWICE DAILY.  YOUR BIOPSY RESULTS WILL BE AVAILABLE IN MY CHART  MAY 25 AND MY OFFICE WILL CONTACT YOU IN 10-14 DAYS WITH YOUR RESULTS.   Next colonoscopy in 3-5 years.    ENDOSCOPY Care After Read the instructions outlined below and refer to this sheet in the next week. These discharge instructions provide you with general information on caring for yourself after you leave the hospital. While your treatment has been planned according to the most current medical practices available, unavoidable complications occasionally occur. If you have any problems or questions after discharge, call DR. Pragya Lofaso, [phone].  ACTIVITY  You may resume your regular activity, but move at a slower pace for the next 24 hours.   Take frequent rest periods for the next 24 hours.   Walking will help get rid of the air and reduce the bloated feeling in your belly (abdomen).   No driving for 24 hours (because of the medicine (anesthesia) used during the test).   You may shower.   Do not sign any important legal documents or operate any machinery for 24 hours (because of the anesthesia used during the test).    NUTRITION  Drink plenty of fluids.   You may resume your normal diet as instructed by your doctor.   Begin with a light meal and progress to your normal diet. Heavy or fried foods are harder to digest and may make you feel sick to your stomach (nauseated).   Avoid alcoholic beverages for 24 hours or as instructed.    MEDICATIONS  You may resume your normal  medications.   WHAT YOU CAN EXPECT TODAY  Some feelings of bloating in the abdomen.   Passage of more gas than usual.   Spotting of blood in your stool or on the toilet paper  .  IF YOU HAD POLYPS REMOVED DURING THE ENDOSCOPY:  Eat a soft diet IF YOU HAVE NAUSEA, BLOATING, ABDOMINAL PAIN, OR VOMITING.    FINDING OUT THE RESULTS OF YOUR TEST Not all test results are available during your visit. DR. Oneida Alar WILL CALL YOU WITHIN 14 DAYS OF YOUR PROCEDUE WITH YOUR RESULTS. Do not assume everything is normal if you have not heard from DR. Donny Heffern,CALL HER OFFICE AT [phone].  SEEK IMMEDIATE MEDICAL ATTENTION AND CALL THE OFFICE: [phone] IF:  You have more than a spotting of blood in your stool.   Your belly is swollen (abdominal distention).   You are nauseated or vomiting.   You have a temperature over 101F.   You have abdominal pain or discomfort that is severe or gets worse throughout the day.   Gastritis  Gastritis is inflammation (the body's way of reacting to injury and/or infection) of the stomach/SMALLBOWEL. It is often caused by viral or bacterial (germ) infections. It can also be caused BY ASPIRIN, BC/GOODY POWDER'S, (IBUPROFEN) MOTRIN, OR ALEVE (NAPROXEN), chemicals (including alcohol), SPICY FOODS, and medications. This illness may be associated with generalized malaise (feeling tired, not well), UPPER ABDOMINAL STOMACH cramps, and fever. One common bacterial cause of gastritis is an organism  known as H. Pylori. This can be treated with antibiotics.    High-Fiber Diet A high-fiber diet changes your normal diet to include more whole grains, legumes, fruits, and vegetables. Changes in the diet involve replacing refined carbohydrates with unrefined foods. The calorie level of the diet is essentially unchanged. The Dietary Reference Intake (recommended amount) for adult males is 38 grams per day. For adult females, it is 25 grams per day. Pregnant and lactating women  should consume 28 grams of fiber per day. Fiber is the intact part of a plant that is not broken down during digestion. Functional fiber is fiber that has been isolated from the plant to provide a beneficial effect in the body. PURPOSE  Increase stool bulk.   Ease and regulate bowel movements.   Lower cholesterol.  INDICATIONS THAT YOU NEED MORE FIBER  Constipation and hemorrhoids.   Uncomplicated diverticulosis (intestine condition) and irritable bowel syndrome.   Weight management.   As a protective measure against hardening of the arteries (atherosclerosis), diabetes, and cancer.   GUIDELINES FOR INCREASING FIBER IN THE DIET  Start adding fiber to the diet slowly. A gradual increase of about 5 more grams (2 slices of whole-wheat bread, 2 servings of most fruits or vegetables, or 1 bowl of high-fiber cereal) per day is best. Too rapid an increase in fiber may result in constipation, flatulence, and bloating.   Drink enough water and fluids to keep your urine clear or pale yellow. Water, juice, or caffeine-free drinks are recommended. Not drinking enough fluid may cause constipation.   Eat a variety of high-fiber foods rather than one type of fiber.   Try to increase your intake of fiber through using high-fiber foods rather than fiber pills or supplements that contain small amounts of fiber.   The goal is to change the types of food eaten. Do not supplement your present diet with high-fiber foods, but replace foods in your present diet.   INCLUDE A VARIETY OF FIBER SOURCES  Replace refined and processed grains with whole grains, canned fruits with fresh fruits, and incorporate other fiber sources. White rice, white breads, and most bakery goods contain little or no fiber.   Brown whole-grain rice, buckwheat oats, and many fruits and vegetables are all good sources of fiber. These include: broccoli, Brussels sprouts, cabbage, cauliflower, beets, sweet potatoes, white potatoes  (skin on), carrots, tomatoes, eggplant, squash, berries, fresh fruits, and dried fruits.   Cereals appear to be the richest source of fiber. Cereal fiber is found in whole grains and bran. Bran is the fiber-rich outer coat of cereal grain, which is largely removed in refining. In whole-grain cereals, the bran remains. In breakfast cereals, the largest amount of fiber is found in those with "bran" in their names. The fiber content is sometimes indicated on the label.   You may need to include additional fruits and vegetables each day.   In baking, for 1 cup white flour, you may use the following substitutions:   1 cup whole-wheat flour minus 2 tablespoons.   1/2 cup white flour plus 1/2 cup whole-wheat flour.  Diverticulosis Diverticulosis is a common condition that develops when small pouches (diverticula) form in the wall of the colon. The risk of diverticulosis increases with age. It happens more often in people who eat a low-fiber diet. Most individuals with diverticulosis have no symptoms. Those individuals with symptoms usually experience belly (abdominal) pain, constipation, or loose stools (diarrhea).  HOME CARE INSTRUCTIONS  Increase the amount of fiber  in your diet as directed by your caregiver or dietician. This may reduce symptoms of diverticulosis.   Drink at least 6 to 8 glasses of water each day to prevent constipation.   Try not to strain when you have a bowel movement.   Avoiding nuts and seeds to prevent complications is NOT NECESSARY.        FOODS HAVING HIGH FIBER CONTENT INCLUDE:  Fruits. Apple, peach, pear, tangerine, raisins, prunes.   Vegetables. Brussels sprouts, asparagus, broccoli, cabbage, carrot, cauliflower, romaine lettuce, spinach, summer squash, tomato, winter squash, zucchini.   Starchy Vegetables. Baked beans, kidney beans, lima beans, split peas, lentils, potatoes (with skin).   Grains. Whole wheat bread, brown rice, bran flake cereal, plain  oatmeal, white rice, shredded wheat, bran muffins.   Polyps, Colon  A polyp is extra tissue that grows inside your body. Colon polyps grow in the large intestine. The large intestine, also called the colon, is part of your digestive system. It is a long, hollow tube at the end of your digestive tract where your body makes and stores stool. Most polyps are not dangerous. They are benign. This means they are not cancerous. But over time, some types of polyps can turn into cancer. Polyps that are smaller than a pea are usually not harmful. But larger polyps could someday become or may already be cancerous. To be safe, doctors remove all polyps and test them.   PREVENTION There is not one sure way to prevent polyps. You might be able to lower your risk of getting them if you:  Eat more fruits and vegetables and less fatty food.   Do not smoke.   Avoid alcohol.   Exercise every day.   Lose weight if you are overweight.   Eating more calcium and folate can also lower your risk of getting polyps. Some foods that are rich in calcium are milk, cheese, and broccoli. Some foods that are rich in folate are chickpeas, kidney beans, and spinach.

## 2015-09-07 NOTE — Progress Notes (Signed)
REVIEWED-NO ADDITIONAL RECOMMENDATIONS. 

## 2015-09-07 NOTE — Op Note (Signed)
[facility] Patient Name: Dustin Estes Procedure Date: 09/07/2015 2:36 PM MRN: [id number] Date of Birth: 1950-09-20 Attending MD: Barney Drain , MD CSN: [id number] Age: 65 Admit Type: Outpatient Procedure:                Colonoscopy WITH COLD FORCEPS AND SNARE POLYPECTOMY Indications:              Screening for colorectal malignant neoplasm Providers:                Barney Drain, MD, Janeece Riggers, RN, Randa Spike,                            Technician Referring MD:             [name], MD Medicines:                Meperidine 75 mg IV, Midazolam 6 mg IV Complications:            No immediate complications. Estimated Blood Loss:     Estimated blood loss was minimal. Procedure:                Pre-Anesthesia Assessment:                           - Prior to the procedure, a History and Physical                            was performed, and patient medications and                            allergies were reviewed. The patient's tolerance of                            previous anesthesia was also reviewed. The risks                            and benefits of the procedure and the sedation                            options and risks were discussed with the patient.                            All questions were answered, and informed consent                            was obtained. Prior Anticoagulants: The patient has                            taken no previous anticoagulant or antiplatelet                            agents. ASA Grade Assessment: II - A patient with                            mild systemic disease. After reviewing the risks  and benefits, the patient was deemed in                            satisfactory condition to undergo the procedure.                           After obtaining informed consent, the colonoscope                            was passed under direct vision. Throughout the                            procedure, the  patient's blood pressure, pulse, and                            oxygen saturations were monitored continuously. The                            [id number] scope was introduced through                            the anus and advanced to the the cecum, identified                            by appendiceal orifice and ileocecal valve. The                            colonoscopy was somewhat difficult due to a                            tortuous colon. Successful completion of the                            procedure was aided by straightening and shortening                            the scope to obtain bowel loop reduction. The                            patient tolerated the procedure fairly well. The                            ileocecal valve, appendiceal orifice, and rectum                            were photographed. The quality of the bowel                            preparation was good. Scope In: 2:53:29 PM Scope Out: 3:15:01 PM Scope Withdrawal Time: 0 hours 16 minutes 10 seconds  Total Procedure Duration: 0 hours 21 minutes 32 seconds  Findings:      The digital rectal exam was normal.      Three sessile polyps were found in the sigmoid colon, ascending colon  and ileocecal valve. The polyps were 2 to 4 mm in size. These polyps       were removed with a cold biopsy forceps. Resection and retrieval were       complete.      Three sessile polyps were found in the descending colon, splenic flexure       and hepatic flexure. The polyps were 5 to 6 mm in size. These polyps       were removed with a hot snare. Resection and retrieval were complete.      Many small and large-mouthed diverticula were found in the sigmoid colon       and descending colon. ANGULATED RECTOSIGMOID JUNCTION. REDUNDANT LEFT       COLON.      Non-bleeding internal hemorrhoids were found. The hemorrhoids were small. Impression:               - SIX POLYPS REMOVED in the sigmoid colon, in the                             ascending colon and at the ileocecal valve(1), in                            the descending colon, at the splenic flexure and at                            the hepatic flexure.                           - Diverticulosis in the sigmoid colon and in the                            descending colon.                           - Non-bleeding internal hemorrhoids. Moderate Sedation:      Moderate (conscious) sedation was administered by the endoscopy nurse       and supervised by the endoscopist. The following parameters were       monitored: oxygen saturation, heart rate, blood pressure, and response       to care. Total physician intraservice time was 45 minutes. Recommendation:           - High fiber diet.                           - Continue present medications.                           - Await pathology results.                           - Repeat colonoscopy in 3 - 5 years for                            surveillance.                           - Return to GI office in 6 months.                           -  Patient has a contact number available for                            emergencies. The signs and symptoms of potential                            delayed complications were discussed with the                            patient. Return to normal activities tomorrow.                            Written discharge instructions were provided to the                            patient.                           CONTINUE YOUR WEIGHT LOSS EFFORTS. LOSE 10 MORE LBS.                           DRINK WATER TO KEEP YOUR URINE LIGHT YELLOW.                           AVOID ITEMS THAT TRIGGER GASTRITIS. SEE INFO BELOW.                           FOLLOW A HIGH FIBER/LOW FAT DIET. AVOID ITEMS THAT                            CAUSE BLOATING.                           CONTINUE PROTONIX. TAKE 30 MINUTES PRIOR TO MEALS                            TWICE DAILY. Procedure Code(s):        ---  Professional ---                           [phone], Colonoscopy, flexible; with removal of                            tumor(s), polyp(s), or other lesion(s) by snare                            technique                           45380, 59, Colonoscopy, flexible; with biopsy,                            single or multiple                           99153, Moderate sedation services; each additional  15 minutes intraservice time                           99153, Moderate sedation services; each additional                            15 minutes intraservice time                           G0500, Moderate sedation services provided by the                            same physician or other qualified health care                            professional performing a gastrointestinal                            endoscopic service that sedation supports,                            requiring the presence of an independent trained                            observer to assist in the monitoring of the                            patient's level of consciousness and physiological                            status; initial 15 minutes of intra-service time;                            patient age 28 years or older (additional time may                            be reported with [phone], as appropriate) Diagnosis Code(s):        --- Professional ---                           Z12.11, Encounter for screening for malignant                            neoplasm of colon                           D12.5, Benign neoplasm of sigmoid colon                           D12.2, Benign neoplasm of ascending colon                           D12.0, Benign neoplasm of cecum                           D12.4, Benign neoplasm  of descending colon                           D12.3, Benign neoplasm of transverse colon (hepatic                            flexure or splenic flexure)                           K64.8, Other  hemorrhoids                           K57.30, Diverticulosis of large intestine without                            perforation or abscess without bleeding CPT copyright 2016 [employer]. All rights reserved. The codes documented in this report are preliminary and upon coder review may  be revised to meet current compliance requirements. Barney Drain, MD Barney Drain, MD 09/07/2015 9:09:00 PM This report has been signed electronically. Number of Addenda: 0

## 2015-09-07 NOTE — H&P (Signed)
Primary Care Physician:  [name], MD Primary Gastroenterologist:  Dr. Oneida Alar  Pre-Procedure History & Physical: HPI:  Dustin Estes. is a 65 y.o. male here for SCREENING FOR COLON CA AND ULCERS.  Past Medical History  Diagnosis Date  . Dysrhythmia   . Diabetes mellitus   . 2Nd degree AV block 04/29/2011    Medtronic Adapta  . PAD (peripheral artery disease) ([place])   . Abdominal aortic aneurysm (HCC)     4.1 x 4.3 cm  . Hypertension   . Obesity   . Tobacco abuse   . OSA (obstructive sleep apnea)   . Encephalomalacia on imaging study   . AAA (abdominal aortic aneurysm) ([place]) 12/10/2012  . SVT (supraventricular tachycardia) ([place]) 04/30/2011  . Second degree AV block, Mobitz type II 03/19/2014  . Paroxysmal atrial fibrillation ([place]) 03/19/2014    Asymptomatic, pacemaker detected     Past Surgical History  Procedure Laterality Date  . Permanent pacemaker insertion  04/29/2011    Medtronic Adapta  . Brain surgery    . Appendectomy  12/69  . Bladder surgery  9/04,11/05,9/07    tumor removal  . US echocardiography  04/13/2010    mild asymmetric LVH, EF >55%,mild mitral ca+,AOV mildly sclerotic  . Nm myocar perf wall motion  04/13/2010    moderate, mostly fixed  inferoseptal defect, suspicious for artifact.  . Permanent pacemaker insertion N/A 04/29/2011    Procedure: PERMANENT PACEMAKER INSERTION;  Surgeon: Sanda Klein, MD;  Location: [place] CATH LAB;  Service: Cardiovascular;  Laterality: N/A;  . Loop recorder explant  04/29/2011    Procedure: LOOP RECORDER EXPLANT;  Surgeon: Sanda Klein, MD;  Location: [place] [facility];  Service: Cardiovascular;;  . Esophagogastroduodenoscopy N/A 05/22/2015    [id number] duodenal ulcer/single ulcer in the gastris antrum/erosive gastritis    Prior to Admission medications   Medication Sig Start Date End Date Taking? Authorizing Provider  cyanocobalamin 500 MCG tablet Take 1 tablet (500 mcg total) by mouth daily. 05/30/15  Yes Ripudeep Krystal Eaton, MD   fenofibrate 160 MG tablet Take 160 mg by mouth at bedtime.     Yes Historical Provider, MD  Flaxseed, Linseed, 1000 MG CAPS Take 1 capsule by mouth 2 (two) times daily.    Yes Historical Provider, MD  glipiZIDE (GLUCOTROL) 10 MG tablet Take 10 mg by mouth every morning.     Yes Historical Provider, MD  metFORMIN (GLUCOPHAGE) 1000 MG tablet Take 1,000 mg by mouth 2 (two) times daily with a meal.  02/25/14  Yes Historical Provider, MD  metoprolol tartrate (LOPRESSOR) 25 MG tablet Take 0.5 tablets (12.5 mg total) by mouth 2 (two) times daily. 05/30/15  Yes Ripudeep Krystal Eaton, MD  Multiple Vitamin (MULITIVITAMIN WITH MINERALS) TABS Take 1 tablet by mouth at bedtime. Centrum Silver   Yes Historical Provider, MD  niacin 500 MG tablet Take 500 mg by mouth 2 (two) times daily with a meal.     Yes Historical Provider, MD  pantoprazole (PROTONIX) 40 MG tablet Take 1 tablet (40 mg total) by mouth 2 (two) times daily. 05/30/15  Yes Ripudeep Krystal Eaton, MD  quinapril (ACCUPRIL) 20 MG tablet Take 20 mg by mouth at bedtime.   Yes Historical Provider, MD  simvastatin (ZOCOR) 40 MG tablet Take 40 mg by mouth at bedtime.    Yes Historical Provider, MD    Allergies as of 08/26/2015  . (No Known Allergies)    Family History  Problem Relation Age of Onset  . Diabetes  Mother   . Diabetes Father   . Stroke Father     Social History   Social History  . Marital Status: Divorced    Spouse Name: N/A  . Number of Children: 2  . Years of Education: GED   Occupational History  .      retired, owned Sales promotion account executive, [employer]  . Smoking status: Current Every Day Smoker -- 2.00 packs/day for 47 years    Types: Cigarettes  . Smokeless tobacco: Never Used     Comment: 4 (10packs per caroton) cartons/month (01/13/14)  . Alcohol Use: Yes     Comment: "hardly ever"  . Drug Use: No  . Sexual Activity: No   Other Topics Concern  . Not on file   Social History Narrative   Single, lives alone    Caffeine use- coffee 2 cups/week    Review of Systems: See HPI, otherwise negative ROS   Physical Exam: BP 106/67 mmHg  Pulse 79  Temp(Src) 98.2 F (36.8 C) (Oral)  Resp 16  Ht 5\' 8"  (1.727 m)  Wt 230 lb (104.327 kg)  BMI 34.98 kg/m2  SpO2 98% General:   Alert,  pleasant and cooperative in NAD Head:  Normocephalic and atraumatic. Neck:  Supple; Lungs:  Clear throughout to auscultation.    Heart:  Regular rate and rhythm. Abdomen:  Soft, nontender and nondistended. Normal bowel sounds, without guarding, and without rebound.   Neurologic:  Alert and  oriented x4;  grossly normal neurologically.  Impression/Plan:     SCREENING FOR COLON CA AND ULCERS  PLAN:  1.EGD/TCS TODAY

## 2015-09-09 ENCOUNTER — Ambulatory Visit (INDEPENDENT_AMBULATORY_CARE_PROVIDER_SITE_OTHER): Payer: Medicare Other | Admitting: Diagnostic Neuroimaging

## 2015-09-09 ENCOUNTER — Encounter: Payer: Self-pay | Admitting: Diagnostic Neuroimaging

## 2015-09-09 VITALS — BP 138/79 | HR 93 | Ht 68.0 in | Wt 229.2 lb

## 2015-09-09 DIAGNOSIS — R404 Transient alteration of awareness: Secondary | ICD-10-CM | POA: Diagnosis not present

## 2015-09-09 NOTE — Progress Notes (Signed)
[facility]  PATIENT: Dustin Estes. DOB: 12/10/1950  REFERRING CLINICIAN: Tollie Pizza HISTORY FROM: patient  REASON FOR VISIT: follow up   HISTORICAL  CHIEF COMPLAINT:  Chief Complaint  Patient presents with  . Transient alteration of awareness    rm 6  . Follow-up    3 month    HISTORY OF PRESENT ILLNESS:   UPDATE 09/09/15: Since last visit, patient is stable. He is planning to get some labs with Dr. Debara Pickett. Still living on his own. Patient has not heard from his daughter in last few months apparently.   PRIOR HPI (06/12/15): 65 year old male here for evaluation of medical competency and mental state. Hospital discharge notes and referring M.D. notes were reviewed and summarized as below. Patient was admitted to the hospital from February 2 until 06/01/2015. Patient has history of hypertension, tobacco abuse diabetes hypertension, and was brought to the hospital after he had progressive malaise and decline at home. At one point patient was so weak at home, unable to respond to his family members or get up, patient's daughter called paramedics to have him admitted to the hospital. Initially patient declined and felt that he was fine initially had some rest. Ultimately police had to be called to take patient against his wishes to the hospital for evaluation. Once he was evaluated in the emergency room he was diagnosed with acute GI bleeding due to large duodenal ulcer. He required prolonged hospital stay, blood transfusion and other workup. Patient was also diagnosed with severe B12 deficiency, acute kidney injury, and poor functional status at home. Patient was medically stabilized and discharge planning was pursued. Patient's daughter felt that patient was not capable of taking care of himself at home. Therefore psychiatry consultation was obtained to determine medical decision-making capability. Patient did not meet criteria for psychiatry inpatient admission. However he  was deemed not competent to make his own decisions. It was determined that he should not live alone and would need more supervision from his family or other parties. Since that time patient has moved back home. He is doing better from a mental and cognitive and physical standpoint that before. He demonstrates good insight into his current situation and knows that if in the future he has some medical problems and his family requested to go to the doctor, he will comply. However on conversation patient is at times tangential and long-winded in conversation. He is quite disheveled, unkempt, smells of severe cigarette smoke and is quite malodorous. He talks excitedly about returning home from clinic to prepare a large pan of chocolate and pineapple pudding which he plans to eat over the next few days. Patient does have good memory of his prior working life. Apparently he retired in 2012. He has been living on his own since that time. Apparently he had multiple falls in 2011 and 12 with subsequent subdural hematoma, traumatic brain injury and needed neurosurgical drainage. After these traumas he moved in to a home alone by his brother [place] property. Patient currently living alone, driving, taking care of his day-to-day activities. Review of hospital notes indicates that his home is quite a mess with garbage and rats everywhere. Unfortunately patient is here for this visit alone and no collateral history available.    REVIEW OF SYSTEMS: Full 14 system review of systems performed and negative exception as per HPI.  ALLERGIES: No Known Allergies  HOME MEDICATIONS: Outpatient Prescriptions Prior to Visit  Medication Sig Dispense Refill  . cyanocobalamin 500 MCG  tablet Take 1 tablet (500 mcg total) by mouth daily. 30 tablet 2  . fenofibrate 160 MG tablet Take 160 mg by mouth at bedtime.      . Flaxseed, Linseed, 1000 MG CAPS Take 1 capsule by mouth 2 (two) times daily.     Marland Kitchen glipiZIDE (GLUCOTROL) 10  MG tablet Take 10 mg by mouth every morning.      . metFORMIN (GLUCOPHAGE) 1000 MG tablet Take 1,000 mg by mouth 2 (two) times daily with a meal.     . metoprolol tartrate (LOPRESSOR) 25 MG tablet Take 0.5 tablets (12.5 mg total) by mouth 2 (two) times daily. 60 tablet 2  . Multiple Vitamin (MULITIVITAMIN WITH MINERALS) TABS Take 1 tablet by mouth at bedtime. Centrum Silver    . niacin 500 MG tablet Take 500 mg by mouth 2 (two) times daily with a meal.      . pantoprazole (PROTONIX) 40 MG tablet Take 1 tablet (40 mg total) by mouth 2 (two) times daily. 60 tablet 2  . quinapril (ACCUPRIL) 20 MG tablet Take 20 mg by mouth at bedtime.    . simvastatin (ZOCOR) 40 MG tablet Take 40 mg by mouth at bedtime.      No facility-administered medications prior to visit.    PAST MEDICAL HISTORY: Past Medical History  Diagnosis Date  . Dysrhythmia   . Diabetes mellitus   . 2Nd degree AV block 04/29/2011    Medtronic Adapta  . PAD (peripheral artery disease) ([place])   . Abdominal aortic aneurysm (HCC)     4.1 x 4.3 cm  . Hypertension   . Obesity   . Tobacco abuse   . OSA (obstructive sleep apnea)   . Encephalomalacia on imaging study   . AAA (abdominal aortic aneurysm) ([place]) 12/10/2012  . SVT (supraventricular tachycardia) ([place]) 04/30/2011  . Second degree AV block, Mobitz type II 03/19/2014  . Paroxysmal atrial fibrillation ([place]) 03/19/2014    Asymptomatic, pacemaker detected     PAST SURGICAL HISTORY: Past Surgical History  Procedure Laterality Date  . Permanent pacemaker insertion  04/29/2011    Medtronic Adapta  . Brain surgery    . Appendectomy  12/69  . Bladder surgery  9/04,11/05,9/07    tumor removal  . US echocardiography  04/13/2010    mild asymmetric LVH, EF >55%,mild mitral ca+,AOV mildly sclerotic  . Nm myocar perf wall motion  04/13/2010    moderate, mostly fixed  inferoseptal defect, suspicious for artifact.  . Permanent pacemaker insertion N/A 04/29/2011    Procedure: PERMANENT  PACEMAKER INSERTION;  Surgeon: Sanda Klein, MD;  Location: [facility];  Service: Cardiovascular;  Laterality: N/A;  . Loop recorder explant  04/29/2011    Procedure: LOOP RECORDER EXPLANT;  Surgeon: Sanda Klein, MD;  Location: [facility];  Service: Cardiovascular;;  . Esophagogastroduodenoscopy N/A 05/22/2015    [id number] duodenal ulcer/single ulcer in the gastris antrum/erosive gastritis    FAMILY HISTORY: Family History  Problem Relation Age of Onset  . Diabetes Mother   . Diabetes Father   . Stroke Father     SOCIAL HISTORY:  Social History   Social History  . Marital Status: Divorced    Spouse Name: N/A  . Number of Children: 2  . Years of Education: GED   Occupational History  .      retired, owned Sales promotion account executive, [place]  . Smoking status: Current Every Day Smoker -- 2.00 packs/day for 47 years  Types: Cigarettes  . Smokeless tobacco: Never Used     Comment: 4 (10packs per caroton) cartons/month (01/13/14), 09/09/15 smoking 3 cartons every 2 weeks  . Alcohol Use: Yes     Comment: "hardly ever"  . Drug Use: No  . Sexual Activity: No   Other Topics Concern  . Not on file   Social History Narrative   Single, lives alone   Caffeine use- coffee 2 cups/week     PHYSICAL EXAM   GENERAL EXAM/CONSTITUTIONAL: Vitals:  Filed Vitals:   09/09/15 1326  BP: 138/79  Pulse: 93  Height: 5\' 8"  (1.727 m)  Weight: 229 lb 3.2 oz (103.964 kg)   Body mass index is 34.86 kg/(m^2). No exam data present  Patient is in no distress; DISHEVELED, SMELLS OF SMOKE, well developed, nourished and groomed; neck is supple  CRANIOTOMY SCAR ON LEFT SIDE  CARDIOVASCULAR:  Examination of carotid arteries is normal; no carotid bruits  Regular rate and rhythm, no murmurs  Examination of peripheral vascular system by observation and palpation is normal  EYES:  Ophthalmoscopic exam of optic discs and posterior segments is normal; no papilledema  or hemorrhages  MUSCULOSKELETAL:  Gait, strength, tone, movements noted in Neurologic exam below  NEUROLOGIC: MENTAL STATUS:  No flowsheet data found.  awake, alert, oriented to person, place and time  recent and remote memory intact  normal attention and concentration  language fluent, comprehension intact, naming intact,   fund of knowledge appropriate  SOME WHAT TANGENTIAL AT TIMES  CRANIAL NERVE:   2nd - no papilledema on fundoscopic exam  2nd, 3rd, 4th, 6th - pupils equal and reactive to light, visual fields full to confrontation, extraocular muscles intact, no nystagmus  5th - facial sensation symmetric  7th - facial strength symmetric  8th - hearing intact  9th - palate elevates symmetrically, uvula midline  11th - shoulder shrug symmetric  12th - tongue protrusion midline  MOTOR:   normal bulk and tone, full strength in the BUE, BLE  SENSORY:   normal and symmetric to light touch, temperature, vibration  COORDINATION:   finger-nose-finger, fine finger movements normal  REFLEXES:   deep tendon reflexes TRACE and symmetric  GAIT/STATION:   narrow based gait    DIAGNOSTIC DATA (LABS, IMAGING, TESTING) - I reviewed patient records, labs, notes, testing and imaging myself where available.  Lab Results  Component Value Date   WBC 8.4 05/30/2015   HGB 9.8* 05/30/2015   HCT 30.6* 05/30/2015   MCV 94.4 05/30/2015   PLT 244 05/30/2015      Component Value Date/Time   NA 137 05/30/2015 0730   K 4.2 05/30/2015 0730   CL 101 05/30/2015 0730   CO2 27 05/30/2015 0730   GLUCOSE 119* 05/30/2015 0730   BUN 21* 05/30/2015 0730   CREATININE 1.00 05/30/2015 0730   CALCIUM 9.0 05/30/2015 0730   PROT 4.5* 05/25/2015 0525   ALBUMIN 2.1* 05/25/2015 0525   AST 23 05/25/2015 0525   ALT 21 05/25/2015 0525   ALKPHOS 47 05/25/2015 0525   BILITOT 0.7 05/25/2015 0525   GFRNONAA >60 05/30/2015 0730   GFRAA >60 05/30/2015 0730   No results found for:  CHOL, HDL, LDLCALC, LDLDIRECT, TRIG, CHOLHDL Lab Results  Component Value Date   HGBA1C 5.7* 05/21/2015   Lab Results  Component Value Date   VITAMINB12 186 05/24/2015   No results found for: TSH   05/21/15 CT head [I reviewed images myself and agree with interpretation. -VRP]  1. No acute  intracranial abnormalities. 2. Status post left sided craniotomy. There is a focal area of left lateral frontal lobe encephalomalacia from previous hemorrhage and subsequent surgery. 3. Anterior inferior frontal lobe encephalomalacia likely from remote trauma. 4. Mild atrophy.  05/22/15 TTE  - Upper normal LV wall thickness with LVEF 55-60% and grade 1diastolic dysfunction. Normal estimated filling pressure. Trivialmitral regurgitation. Mildly sclerotic aortic valve. Mildlydilated RV with device wire noted. Trivial tricuspid regurgitation.      ASSESSMENT AND PLAN  65 y.o. year old male here with history of subdural hematoma, traumatic brain injury, status post craniotomy, uncontrolled tobacco abuse, history of hypertension, diabetes, sleep apnea, with questionable medical compliance. Today patient has good insight into his condition but prefers to live alone. He does appear to have some medical decision making capacity at this time. However I agree that he should not be living alone, driving, and would benefit from a more supervised and safe environment. He does have some family support nearby but clearly he is not taking care of himself adequately.   Dx:   1. Transient alteration of awareness      PLAN: I spent 15 minutes of face to face time with patient. Greater than 50% of time was spent in counseling and coordination of care with patient. In summary we discussed:  - today patient has reasonable insight, memory and decision making capacity; however, he would likely benefit from more safety, supervision and home health agency services; will defer to PCP to help facilitate this.  Return if  symptoms worsen or fail to improve, for return to PCP.    Penni Bombard, MD [id number], [id number] PM Certified in Neurology, Neurophysiology and Neuroimaging  [facility] [address] [place], [place] [address] [phone]

## 2015-09-10 ENCOUNTER — Encounter (HOSPITAL_COMMUNITY): Payer: Self-pay | Admitting: Gastroenterology

## 2015-09-10 LAB — BASIC METABOLIC PANEL
BUN: 24 mg/dL (ref 7–25)
CO2: 27 mmol/L (ref 20–31)
CREATININE: 1.21 mg/dL (ref 0.70–1.25)
Calcium: 9.7 mg/dL (ref 8.6–10.3)
Chloride: 101 mmol/L (ref 98–110)
Glucose, Bld: 139 mg/dL — ABNORMAL HIGH (ref 65–99)
Potassium: 4.9 mmol/L (ref 3.5–5.3)
Sodium: 139 mmol/L (ref 135–146)

## 2015-09-17 ENCOUNTER — Encounter: Payer: Self-pay | Admitting: Gastroenterology

## 2015-09-17 ENCOUNTER — Telehealth: Payer: Self-pay | Admitting: Gastroenterology

## 2015-09-17 NOTE — Telephone Encounter (Signed)
PT's daughter is aware.  

## 2015-09-17 NOTE — Telephone Encounter (Signed)
Please call pt'S FAMILY. HE had FOUR simple adenomas, ONE SERRATED ADENOMA, AND ONE HYPERPLASTIC POLYP REMOVED.    CONTINUE YOUR WEIGHT LOSS EFFORTS. LOSE 10 MORE LBS.  DRINK WATER TO KEEP YOUR URINE LIGHT YELLOW.  AVOID ITEMS THAT TRIGGER GASTRITIS.   FOLLOW A HIGH FIBER/LOW FAT DIET. AVOID ITEMS THAT CAUSE BLOATING.   CONTINUE PROTONIX. TAKE 30 MINUTES PRIOR TO MEALS TWICE DAILY.  OPV IN 6 MOS DX: PUD E30.  Next colonoscopy in 3 years.

## 2015-09-17 NOTE — Telephone Encounter (Signed)
APPT MADE AND LETTER SENT ON RECALL  °

## 2015-10-01 ENCOUNTER — Encounter: Payer: Self-pay | Admitting: Internal Medicine

## 2015-12-01 ENCOUNTER — Encounter: Payer: Self-pay | Admitting: Cardiovascular Disease

## 2015-12-01 ENCOUNTER — Ambulatory Visit (INDEPENDENT_AMBULATORY_CARE_PROVIDER_SITE_OTHER): Payer: Medicare Other | Admitting: Cardiovascular Disease

## 2015-12-01 VITALS — BP 118/64 | HR 82 | Ht 69.0 in | Wt 245.5 lb

## 2015-12-01 DIAGNOSIS — I441 Atrioventricular block, second degree: Secondary | ICD-10-CM | POA: Diagnosis not present

## 2015-12-01 DIAGNOSIS — E785 Hyperlipidemia, unspecified: Secondary | ICD-10-CM | POA: Diagnosis not present

## 2015-12-01 DIAGNOSIS — I714 Abdominal aortic aneurysm, without rupture, unspecified: Secondary | ICD-10-CM

## 2015-12-01 DIAGNOSIS — I480 Paroxysmal atrial fibrillation: Secondary | ICD-10-CM | POA: Diagnosis not present

## 2015-12-01 DIAGNOSIS — Z950 Presence of cardiac pacemaker: Secondary | ICD-10-CM | POA: Diagnosis not present

## 2015-12-01 DIAGNOSIS — Z720 Tobacco use: Secondary | ICD-10-CM

## 2015-12-01 LAB — CUP PACEART INCLINIC DEVICE CHECK
Battery Impedance: 186 Ohm
Battery Remaining Longevity: 152 mo
Battery Voltage: 2.79 V
Brady Statistic AP VP Percent: 1 %
Date Time Interrogation Session: 20170815112542
Implantable Lead Location: 753859
Implantable Lead Model: 5076
Lead Channel Impedance Value: 833 Ohm
Lead Channel Pacing Threshold Amplitude: 0.75 V
Lead Channel Pacing Threshold Amplitude: 0.75 V
Lead Channel Pacing Threshold Pulse Width: 0.4 ms
Lead Channel Setting Sensing Sensitivity: 2.8 mV
MDC IDC LEAD IMPLANT DT: 20130111
MDC IDC LEAD IMPLANT DT: 20130111
MDC IDC LEAD LOCATION: 753860
MDC IDC MSMT LEADCHNL RA IMPEDANCE VALUE: 413 Ohm
MDC IDC MSMT LEADCHNL RA PACING THRESHOLD PULSEWIDTH: 0.4 ms
MDC IDC MSMT LEADCHNL RA SENSING INTR AMPL: 1.4 mV
MDC IDC MSMT LEADCHNL RV PACING THRESHOLD AMPLITUDE: 0.75 V
MDC IDC MSMT LEADCHNL RV PACING THRESHOLD AMPLITUDE: 0.75 V
MDC IDC MSMT LEADCHNL RV PACING THRESHOLD PULSEWIDTH: 0.4 ms
MDC IDC MSMT LEADCHNL RV PACING THRESHOLD PULSEWIDTH: 0.4 ms
MDC IDC MSMT LEADCHNL RV SENSING INTR AMPL: 8 mV
MDC IDC SET LEADCHNL RA PACING AMPLITUDE: 1.5 V
MDC IDC SET LEADCHNL RV PACING AMPLITUDE: 2 V
MDC IDC SET LEADCHNL RV PACING PULSEWIDTH: 0.4 ms
MDC IDC STAT BRADY AP VS PERCENT: 5 %
MDC IDC STAT BRADY AS VP PERCENT: 1 %
MDC IDC STAT BRADY AS VS PERCENT: 93 %

## 2015-12-01 NOTE — Patient Instructions (Signed)
Dr Sallyanne Kuster recommends that you continue on your current medications as directed. Please refer to the Current Medication list given to you today.  Remote monitoring is used to monitor your Pacemaker of ICD from home. This monitoring reduces the number of office visits required to check your device to one time per year. It allows [place] to keep an eye on the functioning of your device to ensure it is working properly. You are scheduled for a device check from home on Tuesday, November 14th, 2017. You may send your transmission at any time that day. If you have a wireless device, the transmission will be sent automatically. After your physician reviews your transmission, you will receive a postcard with your next transmission date.  Dr Sallyanne Kuster recommends that you schedule a follow-up appointment in 12 months with a pacemaker check. You will receive a reminder letter in the mail two months in advance. If you don't receive a letter, please call our office to schedule the follow-up appointment.  If you need a refill on your cardiac medications before your next appointment, please call your pharmacy.

## 2015-12-01 NOTE — Progress Notes (Signed)
Cardiology Office Note    Date:  12/01/2015   ID:  Dustin Morgans., DOB 1951-03-02, MRN [id number]  PCP:  [name], MD  Cardiologist:  K. Mali Hilty, MD; Sanda Klein, MD   Chief Complaint  Patient presents with  . Follow-up    pacer check, no chest pain, discuss meds    History of Present Illness:  Dustin Monsees. is a 65 y.o. male with a history of transient high-grade second-degree AV block and syncope, moderate size abdominal aortic aneurysm, peripheral arterial disease with bilateral superficial femoral artery stenoses, ongoing tobacco use.  He was hospitalized in early February with gastrointestinal bleeding. He received 2 units of blood transfusion. EGD showed a large duodenal ulcer and an ulcer in the gastric antrum. He was also discovered have severe vitamin B12 deficiency. Aspirin was discontinued. Echo during that hospitalization showed a left ventricular ejection fraction of 55-60 percent and grade 1 diastolic dysfunction. He remains noncompliant with CPAP for obstructive sleep apnea.  Pacemaker interrogation shows normal device function with infrequent need for pacing. His Medtronic Adapta dual-chamber pacemaker was implanted in 2013 and still has an anticipated 12.5 years of generator longevity. There is 5.7% atrial pacing and 1.7% ventricular pacing.   He has had a handful of atrial arrhythmias including atrial fibrillation. 6 episodes have been recorded and one of them was 8 minutes and 52 seconds in duration. All the episodes occurred in late January and very early February when he was hospitalized with GI bleeding. None has been recorded since February 7.  We provided him a cellular service pacemaker transmitter, but he has yet to connect it. We went over the use of this device in detail today.  Past Medical History:  Diagnosis Date  . 2Nd degree AV block 04/29/2011   Medtronic Adapta  . AAA (abdominal aortic aneurysm) ([place]) 12/10/2012  . Abdominal aortic  aneurysm (HCC)    4.1 x 4.3 cm  . Diabetes mellitus   . Dysrhythmia   . Encephalomalacia on imaging study   . Hypertension   . Obesity   . OSA (obstructive sleep apnea)   . PAD (peripheral artery disease) ([place])   . Paroxysmal atrial fibrillation ([place]) 03/19/2014   Asymptomatic, pacemaker detected   . Second degree AV block, Mobitz type II 03/19/2014  . SVT (supraventricular tachycardia) ([place]) 04/30/2011  . Tobacco abuse     Past Surgical History:  Procedure Laterality Date  . APPENDECTOMY  12/69  . BLADDER SURGERY  9/04,11/05,9/07   tumor removal  . BRAIN SURGERY    . COLONOSCOPY N/A 09/07/2015   Procedure: COLONOSCOPY;  Surgeon: Danie Binder, MD;  [facility];  Service: Endoscopy;  Laterality: N/A;  2:00 - moved to 1:45 - office to notify  . ESOPHAGOGASTRODUODENOSCOPY N/A 05/22/2015   [id number] duodenal ulcer/single ulcer in the gastris antrum/erosive gastritis  . ESOPHAGOGASTRODUODENOSCOPY N/A 09/07/2015   Procedure: ESOPHAGOGASTRODUODENOSCOPY (EGD);  Surgeon: Danie Binder, MD;  [facility];  Service: Endoscopy;  Laterality: N/A;  . LOOP RECORDER EXPLANT  04/29/2011   Procedure: LOOP RECORDER EXPLANT;  Surgeon: Sanda Klein, MD;  Location: [facility];  Service: Cardiovascular;;  . NM MYOCAR PERF WALL MOTION  04/13/2010   moderate, mostly fixed  inferoseptal defect, suspicious for artifact.  Marland Kitchen PERMANENT PACEMAKER INSERTION  04/29/2011   Medtronic Adapta  . PERMANENT PACEMAKER INSERTION N/A 04/29/2011   Procedure: PERMANENT PACEMAKER INSERTION;  Surgeon: Sanda Klein, MD;  Location: [facility];  Service: Cardiovascular;  Laterality: N/A;  . US ECHOCARDIOGRAPHY  04/13/2010   mild asymmetric LVH, EF >55%,mild mitral ca+,AOV mildly sclerotic    Current Medications: Outpatient Medications Prior to Visit  Medication Sig Dispense Refill  . cyanocobalamin 500 MCG tablet Take 1 tablet (500 mcg total) by mouth daily. 30 tablet 2  . fenofibrate 160 MG tablet  Take 160 mg by mouth at bedtime.      . Flaxseed, Linseed, 1000 MG CAPS Take 1 capsule by mouth 2 (two) times daily.     Marland Kitchen glipiZIDE (GLUCOTROL) 10 MG tablet Take 10 mg by mouth every morning.      . metFORMIN (GLUCOPHAGE) 1000 MG tablet Take 1,000 mg by mouth 2 (two) times daily with a meal.     . metoprolol tartrate (LOPRESSOR) 25 MG tablet Take 0.5 tablets (12.5 mg total) by mouth 2 (two) times daily. 60 tablet 2  . Multiple Vitamin (MULITIVITAMIN WITH MINERALS) TABS Take 1 tablet by mouth at bedtime. Centrum Silver    . niacin 500 MG tablet Take 500 mg by mouth 2 (two) times daily with a meal.      . pantoprazole (PROTONIX) 40 MG tablet Take 1 tablet (40 mg total) by mouth 2 (two) times daily. 60 tablet 2  . quinapril (ACCUPRIL) 20 MG tablet Take 20 mg by mouth at bedtime.    . simvastatin (ZOCOR) 40 MG tablet Take 40 mg by mouth at bedtime.      No facility-administered medications prior to visit.      Allergies:   Review of patient's allergies indicates no known allergies.   Social History   Social History  . Marital status: Divorced    Spouse name: N/A  . Number of children: 2  . Years of education: GED   Occupational History  .      retired, owned Sales promotion account executive, [place]  . Smoking status: Current Every Day Smoker    Packs/day: 2.00    Years: 47.00    Types: Cigarettes  . Smokeless tobacco: Never Used     Comment: 4 (10packs per caroton) cartons/month (01/13/14), 09/09/15 smoking 3 cartons every 2 weeks  . Alcohol use Yes     Comment: "hardly ever"  . Drug use: No  . Sexual activity: No   Other Topics Concern  . None   Social History Narrative   Single, lives alone   Caffeine use- coffee 2 cups/week     Family History:  The patient's family history includes Diabetes in his father and mother; Stroke in his father.   ROS:   Please see the history of present illness.    ROS All other systems reviewed and are negative.   PHYSICAL  EXAM:   VS:  BP 118/64 (BP Location: Left Arm, Patient Position: Sitting, Cuff Size: Large)   Pulse 82   Ht 5\' 9"  (1.753 m)   Wt 245 lb 8 oz (111.4 kg)   BMI 36.25 kg/m    GEN: Moderate to severely obese, well developed, in no acute distress  HEENT: normal  Neck: no JVD, carotid bruits, or masses Cardiac: RRR; no murmurs, rubs, or gallops,no edema of left subclavian pacemaker site Respiratory:  clear to auscultation bilaterally, normal work of breathing GI: soft, nontender, nondistended, + BS MS: no deformity or atrophy  Skin: warm and dry, no rash Neuro:  Alert and Oriented x 3, Strength and sensation are intact Psych: euthymic mood, full affect  Wt Readings from  Last 3 Encounters:  12/01/15 245 lb 8 oz (111.4 kg)  09/09/15 229 lb 3.2 oz (104 kg)  09/07/15 230 lb (104.3 kg)      Studies/Labs Reviewed:   EKG:  EKG is not ordered today.  Recent Labs: 05/25/2015: ALT 21 05/30/2015: Hemoglobin 9.8; Platelets 244 09/09/2015: BUN 24; Creat 1.21; Potassium 4.9; Sodium 139   Lipid Panel No results found for: CHOL, TRIG, HDL, CHOLHDL, VLDL, LDLCALC, LDLDIRECT   ASSESSMENT:    1. Second degree AV block, Mobitz type II   2. Paroxysmal atrial fibrillation (HCC)   3. Pacemaker   4. Dyslipidemia   5. Abdominal aortic aneurysm (AAA) without rupture (HCC)      PLAN:  In order of problems listed above:  1. Second degree AV block: Complicated by syncope in the past, but only occurring infrequently, with less than 2% ventricular paced 2. AFib: The overall burden of arrhythmia is very low, episodes are very brief and they all appear to be concentrated around the time of acute illness. With the recent gastrointestinal bleeding, he is not a good candidate for anticoagulation. We'll continue to monitor via his pacemaker. 3. PPM: Normal pacemaker function. Will resume remote downloads with his new cerebellar transmitted her every 3 months. 4. HLP: He is on a combination of statin and  fenofibrate and niacin. I don't have recent lipid profile values for review 5. AAA: Moderate in size and stable over time. Continue ultrasound monitoring. 6. Smoking cessation strongly advised.    Medication Adjustments/Labs and Tests Ordered: Current medicines are reviewed at length with the patient today.  Concerns regarding medicines are outlined above.  Medication changes, Labs and Tests ordered today are listed in the Patient Instructions below. There are no Patient Instructions on file for this visit.   Signed, Sanda Klein, MD  12/01/2015 11:02 AM    [facility] [place], [place], [place]  [address] Phone: [phone]; Fax: [phone]

## 2015-12-04 ENCOUNTER — Encounter: Payer: Self-pay | Admitting: Cardiovascular Disease

## 2016-03-01 ENCOUNTER — Encounter: Payer: Medicare Other | Admitting: *Deleted

## 2016-03-01 ENCOUNTER — Telehealth: Payer: Self-pay | Admitting: Cardiology

## 2016-03-01 NOTE — Telephone Encounter (Signed)
Spoke with pt and reminded pt of remote transmission that is due today. Pt verbalized understanding.   

## 2016-03-04 ENCOUNTER — Encounter: Payer: Self-pay | Admitting: Cardiology

## 2016-03-22 ENCOUNTER — Ambulatory Visit: Payer: Medicare Other | Admitting: Nurse Practitioner

## 2016-03-22 ENCOUNTER — Encounter: Payer: Self-pay | Admitting: Nurse Practitioner

## 2016-03-22 ENCOUNTER — Telehealth: Payer: Self-pay | Admitting: Nurse Practitioner

## 2016-03-22 NOTE — Telephone Encounter (Signed)
PATIENT WAS A NO SHOW AND LETTER SENT  °

## 2016-03-22 NOTE — Telephone Encounter (Signed)
Noted  

## 2016-05-04 ENCOUNTER — Ambulatory Visit: Payer: Medicare Other | Admitting: Nurse Practitioner

## 2016-05-09 ENCOUNTER — Encounter: Payer: Self-pay | Admitting: Gastroenterology

## 2016-05-24 ENCOUNTER — Ambulatory Visit: Payer: Medicare Other | Admitting: Nurse Practitioner

## 2016-05-24 ENCOUNTER — Encounter: Payer: Self-pay | Admitting: Nurse Practitioner

## 2016-05-24 ENCOUNTER — Telehealth: Payer: Self-pay | Admitting: Nurse Practitioner

## 2016-05-24 NOTE — Telephone Encounter (Signed)
PATIENT WAS A NO SHOW AND LETTER SENT  °

## 2016-05-24 NOTE — Telephone Encounter (Signed)
Noted  

## 2016-07-11 ENCOUNTER — Telehealth: Payer: Self-pay | Admitting: Internal Medicine

## 2016-07-11 NOTE — Telephone Encounter (Signed)
New message   Pt is calling stating he has an AAA duplex every 6 months and it's time to schedule. Pt needs a new order for this to be done.

## 2016-07-11 NOTE — Telephone Encounter (Signed)
Order in system from last visit. msg sent to schedulers.

## 2016-07-27 ENCOUNTER — Ambulatory Visit (INDEPENDENT_AMBULATORY_CARE_PROVIDER_SITE_OTHER): Payer: Medicare Other | Admitting: Nurse Practitioner

## 2016-07-27 ENCOUNTER — Encounter: Payer: Self-pay | Admitting: Nurse Practitioner

## 2016-07-27 VITALS — BP 113/55 | HR 93 | Temp 98.0°F | Ht 68.0 in | Wt 229.0 lb

## 2016-07-27 DIAGNOSIS — K264 Chronic or unspecified duodenal ulcer with hemorrhage: Secondary | ICD-10-CM

## 2016-07-27 DIAGNOSIS — K922 Gastrointestinal hemorrhage, unspecified: Secondary | ICD-10-CM

## 2016-07-27 DIAGNOSIS — K250 Acute gastric ulcer with hemorrhage: Secondary | ICD-10-CM

## 2016-07-27 NOTE — Patient Instructions (Signed)
1. Continue taking Protonix 2. Followup in 1 year 3. If your symptoms worsen before then, call our office.

## 2016-07-27 NOTE — Assessment & Plan Note (Signed)
Gastric ulcer with upper GI bleed last year. Follow-up EGD while avoiding NSAIDs and on Protonix twice a day noted healed ulcer. Recommend continue Protonix, return for follow-up in one year. Trigger avoidance, NSAID avoidance.

## 2016-07-27 NOTE — Assessment & Plan Note (Signed)
No further bleeding noted. Continue current medications, return for follow-up in one year.

## 2016-07-27 NOTE — Assessment & Plan Note (Signed)
Duodenal ulcer on EGD last year with associated with upper GI bleed. Follow-up EGD while avoiding NSAIDs and on Protonix twice a day noted healed ulcer. Recommend continue Protonix, return for follow-up in one year. Trigger avoidance, NSAID avoidance.

## 2016-07-27 NOTE — Progress Notes (Signed)
Referring Provider: [name], MD Primary Care Physician:  [name], MD Primary GI:  Dr. Oneida Alar  Chief Complaint  Patient presents with  . GI Bleeding    f/u, doing ok    HPI:   Dustin Estes. is a 66 y.o. male who presents for follow-up on upper GI bleed. The patient was last seen in our office 08/26/2015 for upper GI bleed, acute gastric ulcer, and to schedule a colonoscopy. He had previously been recommended to have a repeat upper endoscopy in May 2017 for GI bleed, duodenal ulcer, gastric ulcer. At the time of his last visit he was doing well, no GI symptoms. Stop NSAIDs and now only takes Tylenol if needed. Hemoglobin had improved 10.4 from 9.81 hospitalized. No colonoscopy in 14 years. Recommended continue her tonics twice a day, colonoscopy and endoscopy.  Colonoscopy completed 09/07/2015 which found six polyps between the sigmoid colon, ascending colon, ileocecal valve, descending colon, splenic flexure, hepatic flexure. Also diverticulosis in the sigmoid and descending colons, nonbleeding internal hemorrhoids. Surgical pathology found the polyps to be a mix of hyperplastic, sessile serrated/adenoma, tubular adenoma. Recommended repeat colonoscopy in 3-5 years. EGD completed the same day found normal esophagus, gastritis, post-ulcer deformity in the duodenum. Recommended trigger avoidance, continue Protonix twice daily. Recommended follow-up office visit 6 months.  Today he states he's doing well. Still taking Protonix. Denies abdominal pain, N/V, hematochezia, melena, unintentional weight loss, fever, chills, GERD symptoms. Denies chest pain, dyspnea, dizziness, lightheadedness, syncope, near syncope. Denies any other upper or lower GI symptoms.  Past Medical History:  Diagnosis Date  . 2nd degree AV block 04/29/2011   Medtronic Adapta  . AAA (abdominal aortic aneurysm) ([facility]) 12/10/2012  . Abdominal aortic aneurysm ([facility])    4.1 x 4.3 cm  . Diabetes mellitus   .  Dysrhythmia   . Encephalomalacia on imaging study   . Hypertension   . Obesity   . OSA (obstructive sleep apnea)   . PAD (peripheral artery disease) ([place])   . Paroxysmal atrial fibrillation ([place]) 03/19/2014   Asymptomatic, pacemaker detected   . Second degree AV block, Mobitz type II 03/19/2014  . SVT (supraventricular tachycardia) ([facility]) 04/30/2011  . Tobacco abuse     Past Surgical History:  Procedure Laterality Date  . APPENDECTOMY  12/69  . BLADDER SURGERY  9/04,11/05,9/07   tumor removal  . BRAIN SURGERY    . COLONOSCOPY N/A 09/07/2015   Procedure: COLONOSCOPY;  Surgeon: Danie Binder, MD;  [facility];  Service: Endoscopy;  Laterality: N/A;  2:00 - moved to 1:45 - office to notify  . ESOPHAGOGASTRODUODENOSCOPY N/A 05/22/2015   ENI:DPOEU duodenal ulcer/single ulcer in the gastris antrum/erosive gastritis  . ESOPHAGOGASTRODUODENOSCOPY N/A 09/07/2015   Procedure: ESOPHAGOGASTRODUODENOSCOPY (EGD);  Surgeon: Danie Binder, MD;  [facility];  Service: Endoscopy;  Laterality: N/A;  . LOOP RECORDER EXPLANT  04/29/2011   Procedure: LOOP RECORDER EXPLANT;  Surgeon: Sanda Klein, MD;  Location: [facility];  Service: Cardiovascular;;  . NM MYOCAR PERF WALL MOTION  04/13/2010   moderate, mostly fixed  inferoseptal defect, suspicious for artifact.  Marland Kitchen PERMANENT PACEMAKER INSERTION  04/29/2011   Medtronic Adapta  . PERMANENT PACEMAKER INSERTION N/A 04/29/2011   Procedure: PERMANENT PACEMAKER INSERTION;  Surgeon: Sanda Klein, MD;  Location: [facility];  Service: Cardiovascular;  Laterality: N/A;  . US ECHOCARDIOGRAPHY  04/13/2010   mild asymmetric LVH, EF >55%,mild mitral ca+,AOV mildly sclerotic    Current Outpatient  Prescriptions  Medication Sig Dispense Refill  . cyanocobalamin 500 MCG tablet Take 1 tablet (500 mcg total) by mouth daily. 30 tablet 2  . fenofibrate 160 MG tablet Take 160 mg by mouth at bedtime.      . Flaxseed, Linseed, 1000 MG CAPS Take 1  capsule by mouth 2 (two) times daily.     Marland Kitchen glipiZIDE (GLUCOTROL) 10 MG tablet Take 10 mg by mouth every morning.      . metFORMIN (GLUCOPHAGE) 1000 MG tablet Take 1,000 mg by mouth 2 (two) times daily with a meal.     . metoprolol tartrate (LOPRESSOR) 25 MG tablet Take 0.5 tablets (12.5 mg total) by mouth 2 (two) times daily. 60 tablet 2  . Multiple Vitamin (MULITIVITAMIN WITH MINERALS) TABS Take 1 tablet by mouth at bedtime. Centrum Silver    . niacin 500 MG tablet Take 500 mg by mouth 2 (two) times daily with a meal.      . pantoprazole (PROTONIX) 40 MG tablet Take 1 tablet (40 mg total) by mouth 2 (two) times daily. 60 tablet 2  . quinapril (ACCUPRIL) 20 MG tablet Take 20 mg by mouth at bedtime.    . simvastatin (ZOCOR) 40 MG tablet Take 40 mg by mouth at bedtime.      No current facility-administered medications for this visit.     Allergies as of 07/27/2016  . (No Known Allergies)    Family History  Problem Relation Age of Onset  . Diabetes Mother   . Diabetes Father   . Stroke Father   . Colon cancer Neg Hx     Social History   Social History  . Marital status: Divorced    Spouse name: N/A  . Number of children: 2  . Years of education: GED   Occupational History  .      retired, owned Sales promotion account executive, [place]  . Smoking status: Current Every Day Smoker    Packs/day: 2.00    Years: 47.00    Types: Cigarettes  . Smokeless tobacco: Never Used     Comment: 4 (10packs per caroton) cartons/month (01/13/14), 09/09/15 smoking 3 cartons every 2 weeks  . Alcohol use Yes     Comment: "hardly ever"  . Drug use: No  . Sexual activity: No   Other Topics Concern  . None   Social History Narrative   Single, lives alone   Caffeine use- coffee 2 cups/week    Review of Systems: Complete ROS negative except as per HPI.   Physical Exam: BP (!) 113/55   Pulse 93   Temp 98 F (36.7 C) (Oral)   Ht 5\' 8"  (1.727 m)   Wt 229 lb (103.9 kg)    BMI 34.82 kg/m  General:   Obese male. Alert and oriented. Pleasant and cooperative. Well-nourished and well-developed.  Eyes:  Without icterus, sclera clear and conjunctiva pink.  Ears:  Normal auditory acuity. Cardiovascular:  S1, S2 present without murmurs appreciated. Normal pulses noted. Extremities without clubbing or edema. Respiratory:  Clear to auscultation bilaterally. No wheezes, rales, or rhonchi. No distress.  Gastrointestinal:  +BS, rounded but soft, non-tender and non-distended. No HSM noted. No guarding or rebound. No masses appreciated.  Rectal:  Deferred  Musculoskalatal:  Symmetrical without gross deformities. Neurologic:  Alert and oriented x4;  grossly normal neurologically. Psych:  Alert and cooperative. Normal mood and affect. Heme/Lymph/Immune: No excessive bruising noted.    07/27/2016 2:24 PM   Disclaimer: This  note was dictated with voice recognition software. Similar sounding words can inadvertently be transcribed and may not be corrected upon review.

## 2016-07-28 NOTE — Progress Notes (Signed)
CC'D TO PCP °

## 2016-08-01 NOTE — Progress Notes (Signed)
REVIEWED-NO ADDITIONAL RECOMMENDATIONS. 

## 2016-08-22 LAB — HEMOGLOBIN A1C: HEMOGLOBIN A1C: 11.5

## 2016-08-25 ENCOUNTER — Other Ambulatory Visit: Payer: Self-pay | Admitting: Internal Medicine

## 2016-08-25 DIAGNOSIS — I714 Abdominal aortic aneurysm, without rupture, unspecified: Secondary | ICD-10-CM

## 2016-08-25 DIAGNOSIS — I739 Peripheral vascular disease, unspecified: Secondary | ICD-10-CM

## 2016-09-02 ENCOUNTER — Ambulatory Visit (HOSPITAL_COMMUNITY)
Admission: RE | Admit: 2016-09-02 | Discharge: 2016-09-02 | Disposition: A | Discharge status: Home / Self Care | Payer: Medicare Other | Source: Ambulatory Visit | Attending: Internal Medicine | Admitting: Internal Medicine

## 2016-09-02 DIAGNOSIS — I714 Abdominal aortic aneurysm, without rupture, unspecified: Secondary | ICD-10-CM

## 2016-09-02 DIAGNOSIS — I739 Peripheral vascular disease, unspecified: Secondary | ICD-10-CM

## 2016-09-02 DIAGNOSIS — I708 Atherosclerosis of other arteries: Secondary | ICD-10-CM | POA: Insufficient documentation

## 2016-09-02 DIAGNOSIS — E785 Hyperlipidemia, unspecified: Secondary | ICD-10-CM | POA: Insufficient documentation

## 2016-09-02 DIAGNOSIS — E1151 Type 2 diabetes mellitus with diabetic peripheral angiopathy without gangrene: Secondary | ICD-10-CM | POA: Diagnosis not present

## 2016-09-02 DIAGNOSIS — I70201 Unspecified atherosclerosis of native arteries of extremities, right leg: Secondary | ICD-10-CM | POA: Diagnosis not present

## 2016-09-02 DIAGNOSIS — I10 Essential (primary) hypertension: Secondary | ICD-10-CM | POA: Diagnosis not present

## 2016-09-22 ENCOUNTER — Encounter: Payer: Self-pay | Admitting: Internal Medicine

## 2016-09-22 ENCOUNTER — Other Ambulatory Visit: Payer: Self-pay | Admitting: *Deleted

## 2016-09-22 DIAGNOSIS — I714 Abdominal aortic aneurysm, without rupture, unspecified: Secondary | ICD-10-CM

## 2016-10-06 ENCOUNTER — Ambulatory Visit (INDEPENDENT_AMBULATORY_CARE_PROVIDER_SITE_OTHER): Payer: Medicare Other | Admitting: Endocrinology

## 2016-10-06 ENCOUNTER — Encounter: Payer: Self-pay | Admitting: Endocrinology

## 2016-10-06 VITALS — BP 120/72 | HR 108 | Ht 68.0 in | Wt 224.6 lb

## 2016-10-06 DIAGNOSIS — E1165 Type 2 diabetes mellitus with hyperglycemia: Secondary | ICD-10-CM | POA: Diagnosis not present

## 2016-10-06 LAB — BASIC METABOLIC PANEL
BUN: 26 mg/dL — ABNORMAL HIGH (ref 6–23)
CHLORIDE: 102 meq/L (ref 96–112)
CO2: 27 meq/L (ref 19–32)
Calcium: 9 mg/dL (ref 8.4–10.5)
Creatinine, Ser: 1.53 mg/dL — ABNORMAL HIGH (ref 0.40–1.50)
GFR: 48.5 mL/min — ABNORMAL LOW (ref >60.00)
Glucose, Bld: 305 mg/dL — ABNORMAL HIGH (ref 70–99)
Potassium: 4.8 mEq/L (ref 3.5–5.1)
SODIUM: 137 meq/L (ref 135–145)

## 2016-10-06 LAB — GLUCOSE, POCT (MANUAL RESULT ENTRY): POC GLUCOSE: 299 mg/dL — AB (ref 70–99)

## 2016-10-06 MED ORDER — GLIPIZIDE ER 10 MG PO TB24: 10.0000 mg | ORAL_TABLET | Freq: Every day | ORAL | 2 refills | Status: DC

## 2016-10-06 MED ORDER — DULAGLUTIDE 0.75 MG/0.5ML ~~LOC~~ SOAJ: SUBCUTANEOUS | 0 refills | Status: DC

## 2016-10-06 NOTE — Progress Notes (Signed)
Patient ID: Dustin Morgans., male   DOB: Jan 25, 1951, 66 y.o.   MRN: [id number]          Reason for Appointment: Consultation for Type 2 Diabetes  Referring physician: Tollie Pizza   History of Present Illness:          Date of diagnosis of type 2 diabetes mellitus:  1990s      Background history:   Patient has little recall about his medication history and has been on various medications over the years for his diabetes Has mostly taken metformin long-term and no injectable drugs His blood sugars have been more significantly out of control in the last year or so with A1c consistently over 9% since 11/17 Not clear if his medications have been changed last year  Recent history:   Non-insulin hypoglycemic drugs the patient is taking are: 1/2 off the metformin 1000 mg twice a day, glipizide 10 mg daily, Januvia 100 mg daily  Current management, blood sugar patterns and problems identified:  His metformin was reduced to half a tablet twice a day because of his renal dysfunction this year  He has never checked his blood sugar at home  Although he denies excessive fatigue he is drinking a lot of milk and Gatorade probably because of increased thirst  Appears to have lost weight compared to last year           Side effects from medications have been: None  Compliance with the medical regimen: Poor  Glucose monitoring:  not done       Glucometer:  has not had one   Self-care: The diet that the patient has been following is none He likes to drink a lot of milk and will also have Kool-Aid and Gatorade, not clear if he is getting sugar-free kinds  Dietician visit, most recent: 20 years ago               Exercise:  minimal  Weight history:  Wt Readings from Last 3 Encounters:  10/06/16 224 lb 9.6 oz (101.9 kg)  07/27/16 229 lb (103.9 kg)  12/01/15 245 lb 8 oz (111.4 kg)    Glycemic control:   Lab Results  Component Value Date   HGBA1C 11.5 08/22/2016   HGBA1C 5.7 (H)  05/21/2015   Lab Results  Component Value Date   CREATININE 1.21 09/09/2015   No results found for: MICRALBCREAT  No results found for: FRUCTOSAMINE  Microalbumin/creatinine ratio was 76 in 2016   Allergies as of 10/06/2016   No Known Allergies     Medication List       Accurate as of 10/06/16  4:17 PM. Always use your most recent med list.          amLODipine 5 MG tablet Commonly known as:  NORVASC Take 5 mg by mouth daily.   atorvastatin 40 MG tablet Commonly known as:  LIPITOR Take 40 mg by mouth daily.   vitamin B-12 500 MCG tablet Commonly known as:  CYANOCOBALAMIN Take 500 mcg by mouth daily.   cyanocobalamin 500 MCG tablet Take 1 tablet (500 mcg total) by mouth daily.   Dulaglutide 0.75 MG/0.5ML Sopn Commonly known as:  TRULICITY Inject in the abdominal skin as directed once a week   Flaxseed (Linseed) 1000 MG Caps Take 1 capsule by mouth 2 (two) times daily.   gemfibrozil 600 MG tablet Commonly known as:  LOPID Take 600 mg by mouth 2 (two) times daily before a meal.   glipiZIDE 10  MG 24 hr tablet Commonly known as:  GLUCOTROL XL Take 1 tablet (10 mg total) by mouth daily with breakfast.   metFORMIN 1000 MG tablet Commonly known as:  GLUCOPHAGE Take 1,000 mg by mouth 2 (two) times daily with a meal.   metoprolol tartrate 25 MG tablet Commonly known as:  LOPRESSOR Take 0.5 tablets (12.5 mg total) by mouth 2 (two) times daily.   multivitamin with minerals Tabs tablet Take 1 tablet by mouth at bedtime. Centrum Silver   niacin 500 MG tablet Take 500 mg by mouth 2 (two) times daily with a meal.   omega-3 acid ethyl esters 1 g capsule Commonly known as:  LOVAZA Take by mouth 2 (two) times daily.   pantoprazole 40 MG tablet Commonly known as:  PROTONIX Take 1 tablet (40 mg total) by mouth 2 (two) times daily.   quinapril 10 MG tablet Commonly known as:  ACCUPRIL Take 10 mg by mouth daily.   sitaGLIPtin 100 MG tablet Commonly known as:   JANUVIA Take 100 mg by mouth daily.       Allergies: No Known Allergies  Past Medical History:  Diagnosis Date  . 2nd degree AV block 04/29/2011   Medtronic Adapta  . AAA (abdominal aortic aneurysm) ([facility]) 12/10/2012  . Abdominal aortic aneurysm ([facility])    4.1 x 4.3 cm  . Diabetes mellitus   . Dysrhythmia   . Encephalomalacia on imaging study   . Hypertension   . Obesity   . OSA (obstructive sleep apnea)   . PAD (peripheral artery disease) ([place])   . Paroxysmal atrial fibrillation ([facility]) 03/19/2014   Asymptomatic, pacemaker detected   . Second degree AV block, Mobitz type II 03/19/2014  . SVT (supraventricular tachycardia) ([facility]) 04/30/2011  . Tobacco abuse     Past Surgical History:  Procedure Laterality Date  . APPENDECTOMY  12/69  . BLADDER SURGERY  9/04,11/05,9/07   tumor removal  . BRAIN SURGERY    . COLONOSCOPY N/A 09/07/2015   Procedure: COLONOSCOPY;  Surgeon: Danie Binder, MD;  [facility];  Service: Endoscopy;  Laterality: N/A;  2:00 - moved to 1:45 - office to notify  . ESOPHAGOGASTRODUODENOSCOPY N/A 05/22/2015   GQQ:PYPPJ duodenal ulcer/single ulcer in the gastris antrum/erosive gastritis  . ESOPHAGOGASTRODUODENOSCOPY N/A 09/07/2015   Procedure: ESOPHAGOGASTRODUODENOSCOPY (EGD);  Surgeon: Danie Binder, MD;  [facility];  Service: Endoscopy;  Laterality: N/A;  . LOOP RECORDER EXPLANT  04/29/2011   Procedure: LOOP RECORDER EXPLANT;  Surgeon: Sanda Klein, MD;  Location: [facility];  Service: Cardiovascular;;  . NM MYOCAR PERF WALL MOTION  04/13/2010   moderate, mostly fixed  inferoseptal defect, suspicious for artifact.  Marland Kitchen PERMANENT PACEMAKER INSERTION  04/29/2011   Medtronic Adapta  . PERMANENT PACEMAKER INSERTION N/A 04/29/2011   Procedure: PERMANENT PACEMAKER INSERTION;  Surgeon: Sanda Klein, MD;  Location: [facility];  Service: Cardiovascular;  Laterality: N/A;  . US ECHOCARDIOGRAPHY  04/13/2010   mild asymmetric LVH, EF >55%,mild mitral  ca+,AOV mildly sclerotic    Family History  Problem Relation Age of Onset  . Diabetes Mother   . Diabetes Father   . Stroke Father   . Colon cancer Neg Hx     Social History:  reports that he has been smoking Cigarettes.  He has a 94.00 pack-year smoking history. He has never used smokeless tobacco. He reports that he drinks alcohol. He reports that he does not use drugs.   Review of Systems  Constitutional: Positive  for weight loss. Negative for reduced appetite.  HENT: Positive for headaches.   Eyes: Negative for blurred vision.  Respiratory: Negative for shortness of breath.   Cardiovascular: Negative for palpitations and leg swelling.       Has history of cardiac arrhythmia and history of pacemaker implant  Gastrointestinal: Negative for abdominal pain.  Endocrine: Negative for fatigue and polydipsia.  Genitourinary: Positive for frequency and nocturia.       Getting up at night twice  Musculoskeletal: Negative for joint pain.  Skin: Negative for rash.  Neurological: Negative for numbness and tingling.  Psychiatric/Behavioral: Negative for insomnia.     Lipid history: Has high triglycerides as done by PCP, on 3 drugs of present, most recent triglycerides 1600 with HDL 19 and direct LDL 41   No results found for: CHOL, HDL, LDLCALC, LDLDIRECT, TRIG, CHOLHDL         Hypertension: Mild and treated with quinapril for several years  Most recent eye exam was 6/18, reportedly no diabetic retinopathy, has history of cataract surgery  Most recent foot exam: 6/18    LABS:  Office Visit on 10/06/2016  Component Date Value Ref Range Status  . Hemoglobin A1C 08/22/2016 11.5   Final    Physical Examination:  BP 120/72   Pulse (!) 108   Ht 5\' 8"  (1.727 m)   Wt 224 lb 9.6 oz (101.9 kg)   SpO2 91%   BMI 34.15 kg/m   Standing blood pressure 118/70  GENERAL:         Patient has generalized obesity.   HEENT:         Eye exam shows normal external appearance. Fundus  exam shows no retinopathy. Oral exam shows normal mucosa .  NECK:   There is no lymphadenopathy Thyroid is not enlarged and no nodules felt.  Carotids are normal to palpation and no bruit heard LUNGS:         Chest is symmetrical. Lungs are clear to auscultation.Marland Kitchen   HEART:         Heart sounds:  S1 and S2 are normal. No murmur or click heard., no S3 or S4.   ABDOMEN:   There is no distention present. Liver and spleen are not palpable. No other mass or tenderness present.   NEUROLOGICAL:   Ankle jerks are absent bilaterally.    Diabetic Foot Exam - Simple   Simple Foot Form Diabetic Foot exam was performed with the following findings:  Yes 10/06/2016  3:01 PM  Visual Inspection No deformities, no ulcerations, no other skin breakdown bilaterally:  Yes See comments:  Yes Sensation Testing Intact to touch and monofilament testing bilaterally:  Yes Pulse Check See comments:  Yes Comments Diffuse onychomycosis present 1+ left posterior tibialis otherwise pedal pulses not palpable            Vibration sense is  reduced in distal first toes. MUSCULOSKELETAL:  There is no swelling or deformity of the peripheral joints. Spine is normal to inspection.   EXTREMITIES:     There is no edema. No skin lesions present.Marland Kitchen SKIN:       No rash or lesions of concern.        ASSESSMENT:  Diabetes type 2, uncontrolled With recent A1c 11.5 He is taking metformin and glipizide and Januvia with persistently high readings for several months Most likely he is getting insulin deficient because of his long history of diabetes However he is significantly obese Also his diet is poor and he is  drinking large amounts of drinks such as milk and Gatorade which sugar which is perpetuating his hypoglycemia He has never been instructed and glucose monitoring He has very poor knowledge about diabetes  Complications of diabetes: probable neuropathy  HYPERTENSION: Blood pressure is fairly good although he is having a  relatively fast heart rate, taking metoprolol only 1 tablet daily instead of twice a day  RENAL dysfunction: Etiology unclear although may have been related from his taking OTC aspirin regularly  Hyperlipidemia: Followed by PCP, has high triglycerides worsened by poor diabetes control, obesity and poor diet  PLAN:     Although he probably is insulin deficient and needs insulin this will be likely to complicated for him at this time  Will need to establish what his blood sugar patterns are at home and start glucose monitoring with instructions to be done by nurse educator  He will need to significantly improve his diet especially with eliminating excess high glycemic index drinks such as milk and Gatorade and this was discussed  He is a good candidate for GLP-1 drug to help him with his blood sugar control and weight loss Discussed with the patient the nature of GLP-1 drugs, the action on various organ systems, how they benefit blood glucose control, as well as the benefit of weight loss and  increase satiety . Explained possible side effects, particularly nausea and vomiting that usually resolve over time; discussed safety information in package insert. Demonstrated the medication injection device and injection technique to the patient.  Showed patient where to inject the medication. To start with 0.75 mg dosage weekly for the first 4 weeks Patient brochure on Trulicity  given  He will see the diabetes educator next week for comprehensive  education including instructions on how to start using the pen which he will bring with him  Discussed blood sugar targets  He will switch from glipizide to glipizide ER for better 24 control  Follow-up in 4 weeks and if having consistent hyperglycemia consider adding basal insulin  For his hypertension: Since he is having sinus tachycardia he will need to increase his metoprolol to twice a day Because of his renal dysfunction and low normal blood  pressure he will reduce Accupril to half tablet   Patient Instructions  Increase the METOPROLOL to a full tablet twice a day instead of half  Reduce the QUINAPRIL to half tablet daily  GLIPIZIDE: Start taking the new prescription which is extended release Continue metformin half tablet twice a day  When starting the shot next week you will stop the Januvia tablets  Instructions for the Trulicity injections:  Start TRULICITYwith the pen as shown once weekly on the same day of the week.   You may inject in the stomach, thigh or arm as indicated in the brochure given.  You will feel fullness of the stomach with starting the medication and should try to keep the portions at meals small.  You may experience nausea in the first few days which usually gets better over time   If any questions or concerns are present call the office or the  [facility] at [phone].   STOP excessive amounts of milk, juice and Gatorade May have only half a cup of milk twice a day  Take only sugar free drinks  You will be instructed on testing of blood sugar next week by the diabetes educator       Counseling time on subjects discussed above is over 50% of today's 60  minute visit   Consultation note has been sent to the referring physician  [facility] 10/06/2016, 4:17 PM   Note: This office note was prepared with Dragon voice recognition system technology. Any transcriptional errors that result from this process are unintentional.

## 2016-10-06 NOTE — Patient Instructions (Signed)
Increase the METOPROLOL to a full tablet twice a day instead of half  Reduce the QUINAPRIL to half tablet daily  GLIPIZIDE: Start taking the new prescription which is extended release Continue metformin half tablet twice a day  When starting the shot next week you will stop the Januvia tablets  Instructions for the Trulicity injections:  Start TRULICITYwith the pen as shown once weekly on the same day of the week.   You may inject in the stomach, thigh or arm as indicated in the brochure given.  You will feel fullness of the stomach with starting the medication and should try to keep the portions at meals small.  You may experience nausea in the first few days which usually gets better over time   If any questions or concerns are present call the office or the  [facility] at [phone].   STOP excessive amounts of milk, juice and Gatorade May have only half a cup of milk twice a day  Take only sugar free drinks  You will be instructed on testing of blood sugar next week by the diabetes educator

## 2016-10-12 ENCOUNTER — Encounter: Payer: Medicare Other | Attending: Family Medicine | Admitting: Nutrition

## 2016-10-12 ENCOUNTER — Other Ambulatory Visit: Payer: Self-pay

## 2016-10-12 DIAGNOSIS — Z713 Dietary counseling and surveillance: Secondary | ICD-10-CM | POA: Diagnosis not present

## 2016-10-12 DIAGNOSIS — E1165 Type 2 diabetes mellitus with hyperglycemia: Secondary | ICD-10-CM

## 2016-10-12 MED ORDER — QUINAPRIL HCL 5 MG PO TABS: 5.0000 mg | ORAL_TABLET | Freq: Every day | ORAL | 3 refills | Status: DC

## 2016-10-12 NOTE — Progress Notes (Signed)
Pt. Did not start the trulicity due to cost of $320./month.  He called his insurance carrier, and they switched him to another policy, which will cost him only $28. We reviewed how to use the pen, and he reported good understanding of this. He had questions about his new medication doses, and we reviewed these.  He reported that he could not cut the Quinapril in half, because of it's small size, and Megan was messaged to call in the 5 mg. Tablet.  We also reviewed the need to stop the Januvia when he starts the Trulicity, and he reported good understanding of this.   He had no final questions.

## 2016-10-14 ENCOUNTER — Encounter: Payer: Self-pay | Admitting: Internal Medicine

## 2016-10-14 ENCOUNTER — Ambulatory Visit (INDEPENDENT_AMBULATORY_CARE_PROVIDER_SITE_OTHER): Payer: Medicare Other | Admitting: Internal Medicine

## 2016-10-14 VITALS — BP 117/65 | HR 94 | Ht 68.0 in | Wt 225.2 lb

## 2016-10-14 DIAGNOSIS — I739 Peripheral vascular disease, unspecified: Secondary | ICD-10-CM | POA: Diagnosis not present

## 2016-10-14 DIAGNOSIS — I714 Abdominal aortic aneurysm, without rupture, unspecified: Secondary | ICD-10-CM

## 2016-10-14 DIAGNOSIS — Z720 Tobacco use: Secondary | ICD-10-CM | POA: Diagnosis not present

## 2016-10-14 DIAGNOSIS — G4733 Obstructive sleep apnea (adult) (pediatric): Secondary | ICD-10-CM

## 2016-10-14 DIAGNOSIS — Z950 Presence of cardiac pacemaker: Secondary | ICD-10-CM | POA: Diagnosis not present

## 2016-10-14 NOTE — Patient Instructions (Addendum)
Your physician recommends that you schedule a follow-up appointment in: AUGUST 2018 with Dr. Sallyanne Kuster  You are due for repeat abdominal doppler in December 2018  Your physician wants you to follow-up in: ONE YEAR with Dr. Debara Pickett. You will receive a reminder letter in the mail two months in advance. If you don't receive a letter, please call our office to schedule the follow-up appointment.  Happy [birth year] Birthday!

## 2016-10-14 NOTE — Progress Notes (Signed)
OFFICE NOTE  Chief Complaint:  No complaints  Primary Care Physician: [name], MD  HPI:  Dustin Tanzi. 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 He underwent explantation and permanent pacemaker placement in January of 2013 and has done fairly well since then and has had no further syncopal events. He also has an abdominal aortic aneurysm which was recently reassessed by ultrasound and is fairly stable, measuring 4.1 x 4.3 cm. Unfortunately he has bilateral lower extremity arterial disease with SFA narrowing of 50-69%. His right ABI is 0.84, his left ABI is 0.76. At this point he is not having any symptoms of claudication, however, we recommended we repeat the studies in 1 year. Unfortunately he also has had continued tobacco dependence and smoking now 3 packs a day. We discussed that this probably amounts to about $100 a week, or $400 a month, which is approximately one-third of his monthly Social Security check. Despite that he continues to smoke. Finally his blood pressure was noted to be low today, measuring 80/56 and a third recheck indicated it was about 85/56.  At his last visit I decreased his blood pressure medications and he reported his blood pressure is now better. He also saw Dr. Sallyanne Kuster recently who felt like his pacemaker was working appropriately.  Dustin Estes returns today for followup. He had recent abdominal Dopplers which show a small increase in his abdominal aortic aneurysm to 4.5 by 4.6 cm. he's had slight worsening in his peripheral Dopplers however denies any claudication. He's had no further syncopal episodes since he had a pacemaker placed. Unfortunately continues to smoke. Blood pressure is well controlled.  08/28/2015  Dustin Estes returns today for follow-up. He reports no new symptoms. He last had his pacemaker checked last summer. He was enrolled in a remote device  clinic but unfortunately he got a new device and says he does not have a landline, therefore he is not on any remote transmissions over the past year. He's not scheduled to see Dr. Loletha Estes back in the pacemaker clinic until August. He denies any palpitations or racing heart. He said no further syncopal episodes. We recently did repeat ultrasounds of his abdominal aortic aneurysm which measures less than 4.5 cm. Lower extremity arterial Dopplers are stable with a ABI of 0.8 bilaterally. Blood pressure is well-controlled today 130/60. He's managed to lose about 6 pounds of weight.  10/14/2016  I saw Dustin Estes today in follow-up. He denies any recurrent syncope. He is overdue for check with Dr. Loletha Estes for his pacemaker. He says he cannot get his remote checks to work. He has been followed for an abdominal aortic aneurysm which is been stable. He is Dopplers every 6 months. He denies any claudication. Blood pressure is at goal today. Weight is down another 5 pounds. He does appear somewhat disheveled today. He smells like he has not bathed in a while. He says the cares for himself and that his brother checks in on him. 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 He says he sometimes cooks for himself.   PMHx:  Past Medical History:  Diagnosis Date  . 2nd degree AV block 04/29/2011   Medtronic Adapta  . AAA (abdominal aortic aneurysm) ([place]) 12/10/2012  . Abdominal aortic aneurysm ([facility])    4.1 x 4.3 cm  . Diabetes mellitus   . Dysrhythmia   . Encephalomalacia  on imaging study   . Hypertension   . Obesity   . OSA (obstructive sleep apnea)   . PAD (peripheral artery disease) ([place])   . Paroxysmal atrial fibrillation ([place]) 03/19/2014   Asymptomatic, pacemaker detected   . Second degree AV block, Mobitz type II 03/19/2014  . SVT (supraventricular tachycardia) ([place]) 04/30/2011  . Tobacco abuse     Past Surgical History:  Procedure Laterality Date  . APPENDECTOMY  12/69  . BLADDER SURGERY   9/04,11/05,9/07   tumor removal  . BRAIN SURGERY    . COLONOSCOPY N/A 09/07/2015   Procedure: COLONOSCOPY;  Surgeon: Danie Binder, MD;  [facility];  Service: Endoscopy;  Laterality: N/A;  2:00 - moved to 1:45 - office to notify  . ESOPHAGOGASTRODUODENOSCOPY N/A 05/22/2015   QMV:HQION duodenal ulcer/single ulcer in the gastris antrum/erosive gastritis  . ESOPHAGOGASTRODUODENOSCOPY N/A 09/07/2015   Procedure: ESOPHAGOGASTRODUODENOSCOPY (EGD);  Surgeon: Danie Binder, MD;  Location: [facility];  Service: Endoscopy;  Laterality: N/A;  . LOOP RECORDER EXPLANT  04/29/2011   Procedure: LOOP RECORDER EXPLANT;  Surgeon: Sanda Klein, MD;  Location: [facility];  Service: Cardiovascular;;  . NM MYOCAR PERF WALL MOTION  04/13/2010   moderate, mostly fixed  inferoseptal defect, suspicious for artifact.  Marland Kitchen PERMANENT PACEMAKER INSERTION  04/29/2011   Medtronic Adapta  . PERMANENT PACEMAKER INSERTION N/A 04/29/2011   Procedure: PERMANENT PACEMAKER INSERTION;  Surgeon: Sanda Klein, MD;  Location: [facility];  Service: Cardiovascular;  Laterality: N/A;  . US ECHOCARDIOGRAPHY  04/13/2010   mild asymmetric LVH, EF >55%,mild mitral ca+,AOV mildly sclerotic    FAMHx:  Family History  Problem Relation Age of Onset  . Diabetes Mother   . Diabetes Father   . Stroke Father   . Colon cancer Neg Hx     SOCHx:   reports that he has been smoking Cigarettes.  He has a 94.00 pack-year smoking history. He has never used smokeless tobacco. He reports that he drinks alcohol. He reports that he does not use drugs.  ALLERGIES:  No Known Allergies  ROS: Pertinent items noted in HPI and remainder of comprehensive ROS otherwise negative.  HOME MEDS: Current Outpatient Prescriptions  Medication Sig Dispense Refill  . amLODipine (NORVASC) 5 MG tablet Take 5 mg by mouth daily.    Marland Kitchen atorvastatin (LIPITOR) 40 MG tablet Take 40 mg by mouth daily.    . cyanocobalamin 500 MCG tablet Take 1 tablet (500  mcg total) by mouth daily. 30 tablet 2  . Dulaglutide (TRULICITY) 6.29 BM/8.4XL SOPN Inject in the abdominal skin as directed once a week 4 pen 0  . Flaxseed, Linseed, 1000 MG CAPS Take 1 capsule by mouth 2 (two) times daily.     Marland Kitchen gemfibrozil (LOPID) 600 MG tablet Take 600 mg by mouth 2 (two) times daily before a meal.    . glipiZIDE (GLUCOTROL XL) 10 MG 24 hr tablet Take 1 tablet (10 mg total) by mouth daily with breakfast. 30 tablet 2  . metFORMIN (GLUCOPHAGE) 1000 MG tablet Take 1,000 mg by mouth 2 (two) times daily with a meal.     . metoprolol tartrate (LOPRESSOR) 25 MG tablet Take 0.5 tablets (12.5 mg total) by mouth 2 (two) times daily. 60 tablet 2  . Multiple Vitamin (MULITIVITAMIN WITH MINERALS) TABS Take 1 tablet by mouth at bedtime. Centrum Silver    . niacin 500 MG tablet Take 500 mg by mouth 2 (two) times daily with a meal.      .  omega-3 acid ethyl esters (LOVAZA) 1 g capsule Take by mouth 2 (two) times daily.    . pantoprazole (PROTONIX) 40 MG tablet Take 1 tablet (40 mg total) by mouth 2 (two) times daily. 60 tablet 2  . quinapril (ACCUPRIL) 5 MG tablet Take 1 tablet (5 mg total) by mouth at bedtime. 90 tablet 3  . sitaGLIPtin (JANUVIA) 100 MG tablet Take 100 mg by mouth daily.    . vitamin B-12 (CYANOCOBALAMIN) 500 MCG tablet Take 500 mcg by mouth daily.     No current facility-administered medications for this visit.     LABS/IMAGING: No results found for this or any previous visit (from the past 48 hour(s)). No results found.  VITALS: BP 117/65   Pulse 94   Ht 5\' 8"  (1.727 m)   Wt 225 lb 3.2 oz (102.2 kg)   BMI 34.24 kg/m   EXAM: General appearance: alert, no distress and moderately obese Neck: no carotid bruit, no JVD and thyroid not enlarged, symmetric, no tenderness/mass/nodules Lungs: clear to auscultation bilaterally Heart: regular rate and rhythm Abdomen: soft, non-tender; bowel sounds normal; no masses,  no organomegaly Extremities: extremities normal,  atraumatic, no cyanosis or edema and missing 1/2 finger on right hand Pulses: 2+ and symmetric Skin: Skin color, texture, turgor normal. No rashes or lesions Neurologic: Grossly normal Psych: Desheveled appearing, unkempt  EKG: Sinus rhythm at 94, RBBB  ASSESSMENT: 1. Syncope status post dual-chamber pacemaker 2. Obstructive sleep apnea 3. Obesity 4. Diabetes type 2 5. Dyslipidemia 6. Hypertension 7. PAD - Bilateral ABI's at 0.8 8. Stable aortic aneurysm measuring <4.5 cm 9. Tobacco dependence  PLAN: 1.   Dustin Estes denies any further syncopal events. He denies any claudication. He has a stable aortic aneurysm which will reassess by ultrasound in 6 months. He has managed to recently lose some more weight. I'm concerned about his personal hygiene which was fairly poor today although he said he woke up early this morning, fell back asleep for 5-6 hours and then rushed in the last 30 minutes to get here. No changes to his medications today. Surprisingly his blood pressure is at goal. Will arrange for follow-up appointment with Dr. Loletha Estes to interrogate his pacemaker since he's not been able to do remote checks. It's important to reassess his battery life.  Follow-up annually or sooner as necessary.  Pixie Casino, MD, [facility] Attending Cardiologist [place] C Kalik Hoare 10/14/2016, 5:26 PM

## 2016-10-18 NOTE — Patient Instructions (Signed)
Take Trulicity once a week as directed Stop the Januvia when starting Trulicity

## 2016-10-25 NOTE — Addendum Note (Signed)
Addended by: Leland Johns A on: 10/25/2016 11:10 AM   Modules accepted: Orders

## 2016-10-28 ENCOUNTER — Other Ambulatory Visit: Payer: Self-pay | Admitting: Nephrology

## 2016-10-28 DIAGNOSIS — N183 Chronic kidney disease, stage 3 unspecified: Secondary | ICD-10-CM

## 2016-11-03 ENCOUNTER — Telehealth: Payer: Self-pay | Admitting: Endocrinology

## 2016-11-03 NOTE — Telephone Encounter (Signed)
Patient called in reference to  Dulaglutide (TRULICITY) 2.95 JO/8.4ZY SOPN  Patient said he can not afford this medication and he was told that the Aileen Pilot was a lot cheaper and patient wants to know what Dr. Dwyane Dee thinks about switching to that medication instead. Please call and advise.

## 2016-11-04 ENCOUNTER — Ambulatory Visit
Admission: RE | Admit: 2016-11-04 | Discharge: 2016-11-04 | Disposition: A | Discharge status: Home / Self Care | Payer: Medicare Other | Source: Ambulatory Visit | Attending: Nephrology | Admitting: Nephrology

## 2016-11-04 DIAGNOSIS — N183 Chronic kidney disease, stage 3 unspecified: Secondary | ICD-10-CM

## 2016-11-04 NOTE — Telephone Encounter (Signed)
Please advise 

## 2016-11-07 NOTE — Telephone Encounter (Signed)
Routing to you °

## 2016-11-07 NOTE — Telephone Encounter (Signed)
Are you able to schedule this patient to come in and see Dustin Estes for patient education on how to use Victoza pen? (Routing comment)

## 2016-11-07 NOTE — Telephone Encounter (Signed)
He must be referring to Victoza.  He will probably need to be instructed on how to use the pen, start with 0.6 mg and then 1.2 mg after 1 week.  Please coordinate this with Vaughan Basta

## 2016-11-08 ENCOUNTER — Encounter: Payer: Self-pay | Admitting: Endocrinology

## 2016-11-08 NOTE — Telephone Encounter (Signed)
Attempted to call the pt no answer, no vm pick up, call dropped.   Mailed letter

## 2016-11-09 ENCOUNTER — Other Ambulatory Visit: Payer: Self-pay | Admitting: Endocrinology

## 2016-11-09 DIAGNOSIS — E1165 Type 2 diabetes mellitus with hyperglycemia: Secondary | ICD-10-CM

## 2016-11-10 ENCOUNTER — Other Ambulatory Visit: Payer: Self-pay

## 2016-11-10 ENCOUNTER — Telehealth: Payer: Self-pay | Admitting: Endocrinology

## 2016-11-10 ENCOUNTER — Other Ambulatory Visit (INDEPENDENT_AMBULATORY_CARE_PROVIDER_SITE_OTHER): Payer: Medicare Other

## 2016-11-10 DIAGNOSIS — E1165 Type 2 diabetes mellitus with hyperglycemia: Secondary | ICD-10-CM | POA: Diagnosis not present

## 2016-11-10 LAB — LIPID PANEL
CHOLESTEROL: 190 mg/dL (ref 0–200)
HDL: 18.2 mg/dL — ABNORMAL LOW (ref >39.00)
Total CHOL/HDL Ratio: 10
Triglycerides: 1047 mg/dL — ABNORMAL HIGH (ref 0.0–149.0)

## 2016-11-10 LAB — COMPREHENSIVE METABOLIC PANEL
ALBUMIN: 4.2 g/dL (ref 3.5–5.2)
ALT: 16 U/L (ref 0–53)
AST: 19 U/L (ref 0–37)
Alkaline Phosphatase: 81 U/L (ref 39–117)
BUN: 39 mg/dL — AB (ref 6–23)
CHLORIDE: 98 meq/L (ref 96–112)
CO2: 22 mEq/L (ref 19–32)
CREATININE: 2.77 mg/dL — AB (ref 0.40–1.50)
Calcium: 9.2 mg/dL (ref 8.4–10.5)
GFR: 24.44 mL/min — ABNORMAL LOW (ref >60.00)
GLUCOSE: 254 mg/dL — AB (ref 70–99)
Potassium: 5.1 mEq/L (ref 3.5–5.1)
SODIUM: 133 meq/L — AB (ref 135–145)
TOTAL PROTEIN: 8.3 g/dL (ref 6.0–8.3)
Total Bilirubin: 0.3 mg/dL (ref 0.2–1.2)

## 2016-11-10 LAB — LDL CHOLESTEROL, DIRECT: LDL DIRECT: 45 mg/dL

## 2016-11-10 MED ORDER — LIRAGLUTIDE 18 MG/3ML ~~LOC~~ SOPN: PEN_INJECTOR | SUBCUTANEOUS | 1 refills | Status: DC

## 2016-11-10 NOTE — Telephone Encounter (Signed)
Called patient and left a voice message to let him know that we sent a new prescription for Victoza to the [facility] and to call if he has any questions before his visit on Monday.

## 2016-11-10 NOTE — Telephone Encounter (Signed)
Pharmacy calling in reference to  Rx liraglutide 18 MG/3ML [place] wanted to know if the correct instructions were put on the Rx and also stated that it is pretty expensive and patient may not be getting it.   Please call pharmacy and advise.

## 2016-11-10 NOTE — Telephone Encounter (Signed)
Patient called his rx card and was advised that the Dulaglutide (TRULICITY) 7.41 UL/8.4TX SOPN would be $382 a month and that is too expensive. The patient would like to know if he can take the alternative Ictoza for $47 a month. Call patient to advise or he will discuss at his appointment on Monday.

## 2016-11-11 ENCOUNTER — Other Ambulatory Visit: Payer: Self-pay

## 2016-11-11 LAB — FRUCTOSAMINE: FRUCTOSAMINE: 525 umol/L — AB (ref 0–285)

## 2016-11-11 MED ORDER — LIRAGLUTIDE 18 MG/3ML ~~LOC~~ SOPN: PEN_INJECTOR | SUBCUTANEOUS | 1 refills | Status: DC

## 2016-11-11 NOTE — Telephone Encounter (Signed)
Called patient and left a voice message to let him know that he can call his insurance company to see what is more affordable or he can contact his pharmacy and ask them to check to see what would be covered and which may be more affordable for him.

## 2016-11-11 NOTE — Telephone Encounter (Signed)
1.8 mg daily.  He needs to call insurance to find out what is less expensive on his plan

## 2016-11-11 NOTE — Telephone Encounter (Signed)
Please advise if there is another option

## 2016-11-11 NOTE — Telephone Encounter (Signed)
Called pharmacy and spoke with pharmacist and gave him the instructions on his prescription and also pharmacists stated that they would try to contact the patients insurance to see what is covered if this medication is not.

## 2016-11-14 ENCOUNTER — Ambulatory Visit: Payer: Self-pay | Admitting: Endocrinology

## 2016-11-17 ENCOUNTER — Ambulatory Visit (INDEPENDENT_AMBULATORY_CARE_PROVIDER_SITE_OTHER): Payer: Medicare Other | Admitting: Endocrinology

## 2016-11-17 ENCOUNTER — Other Ambulatory Visit: Payer: Self-pay

## 2016-11-17 ENCOUNTER — Encounter: Payer: Self-pay | Admitting: Endocrinology

## 2016-11-17 VITALS — BP 114/64 | HR 88 | Ht 68.0 in | Wt 219.0 lb

## 2016-11-17 DIAGNOSIS — N179 Acute kidney failure, unspecified: Secondary | ICD-10-CM | POA: Diagnosis not present

## 2016-11-17 DIAGNOSIS — E1165 Type 2 diabetes mellitus with hyperglycemia: Secondary | ICD-10-CM | POA: Diagnosis not present

## 2016-11-17 LAB — MICROALBUMIN / CREATININE URINE RATIO
Creatinine,U: 47.8 mg/dL
MICROALB/CREAT RATIO: 4.4 mg/g (ref 0.0–30.0)
Microalb, Ur: 2.1 mg/dL — ABNORMAL HIGH (ref 0.0–1.9)

## 2016-11-17 LAB — POCT GLUCOSE (DEVICE FOR HOME USE): GLUCOSE FASTING, POC: 442 mg/dL — AB (ref 70–99)

## 2016-11-17 MED ORDER — INSULIN LISPRO PROT & LISPRO (75-25 MIX) 100 UNIT/ML KWIKPEN: PEN_INJECTOR | SUBCUTANEOUS | 1 refills | Status: DC

## 2016-11-17 MED ORDER — INSULIN PEN NEEDLE 31G X 5 MM MISC: 4 refills | Status: DC

## 2016-11-17 MED ORDER — GLUCOSE BLOOD VI STRP: ORAL_STRIP | 12 refills | Status: AC

## 2016-11-17 NOTE — Addendum Note (Signed)
Addended by: Kaylyn Lim I on: 11/17/2016 04:31 PM   Modules accepted: Orders

## 2016-11-17 NOTE — Progress Notes (Signed)
Patient ID: Dustin Morgans., male   DOB: [birth year], 66 y.o.   MRN: [id number]          Reason for Appointment: Follow-up for Type 2 Diabetes  Referring physician: Tollie Pizza   History of Present Illness:          Date of diagnosis of type 2 diabetes mellitus:  1990s      Background history:   Patient has little recall about his medication history and has been on various medications over the years for his diabetes Has mostly taken metformin long-term and no injectable drugs His blood sugars have been more significantly out of control in the last year or so with A1c consistently over 9% since 11/17 Not clear if his medications have been changed last year  Recent history:   Non-insulin hypoglycemic drugs the patient is taking are:  metformin 500 mg twice a day, glipizide 10 mg daily, Januvia 100 mg daily  Current management, blood sugar patterns and problems identified:  He was trained by the nurse educator on how to do the Trulicity but he did not pick it up because of the cost and did not start Victoza that was recommended  However he apparently was not trained on doing his home glucose monitoring and does not have meter still  However his blood sugars aren't well-controlled and appear to be getting progressively higher, glucose is 440 and the office and was over 300 in the lab  He states thirsty and is drinking regular Gatorade type drinks, this is all he is drinking today and blood sugars quite high  He has lost more weight  Complaining of increased fatigue also        Side effects from medications have been: None  Compliance with the medical regimen: Poor  Glucose monitoring:  not done       Glucometer:  has not had one   Self-care: The diet that the patient has been following is none He likes to drink a lot of milk and will also have  Gatorade  Dietician visit, most recent: 20 years ago  CDE visit: 7/18               Exercise:  minimal  Weight history:  Wt  Readings from Last 3 Encounters:  11/17/16 219 lb (99.3 kg)  10/14/16 225 lb 3.2 oz (102.2 kg)  10/06/16 224 lb 9.6 oz (101.9 kg)    Glycemic control:   Lab Results  Component Value Date   HGBA1C 11.5 08/22/2016   HGBA1C 5.7 (H) 05/21/2015   Lab Results  Component Value Date   CREATININE 2.77 (H) 11/10/2016   No results found for: MICRALBCREAT  Lab Results  Component Value Date   FRUCTOSAMINE 525 (H) 11/10/2016    Microalbumin/creatinine ratio was 76 in 2016   Allergies as of 11/17/2016   No Known Allergies     Medication List       Accurate as of 11/17/16  3:25 PM. Always use your most recent med list.          amLODipine 5 MG tablet Commonly known as:  NORVASC Take 5 mg by mouth daily.   atorvastatin 40 MG tablet Commonly known as:  LIPITOR Take 40 mg by mouth daily.   vitamin B-12 500 MCG tablet Commonly known as:  CYANOCOBALAMIN Take 500 mcg by mouth daily.   cyanocobalamin 500 MCG tablet Take 1 tablet (500 mcg total) by mouth daily.   Dulaglutide 0.75 MG/0.5ML Sopn Commonly known as:  TRULICITY Inject in the abdominal skin as directed once a week   Flaxseed (Linseed) 1000 MG Caps Take 1 capsule by mouth 2 (two) times daily.   gemfibrozil 600 MG tablet Commonly known as:  LOPID Take 600 mg by mouth 2 (two) times daily before a meal.   glipiZIDE 10 MG 24 hr tablet Commonly known as:  GLUCOTROL XL Take 1 tablet (10 mg total) by mouth daily with breakfast.   Insulin Lispro Prot & Lispro (75-25) 100 UNIT/ML Kwikpen Commonly known as:  HUMALOG MIX 75/25 KWIKPEN 15 units before breakfast and 10 units before supper   liraglutide 18 MG/3ML Sopn Inject 1.8 daily into the abdominal area as directed   metFORMIN 1000 MG tablet Commonly known as:  GLUCOPHAGE Take 1,000 mg by mouth 2 (two) times daily with a meal.   metoprolol tartrate 25 MG tablet Commonly known as:  LOPRESSOR Take 0.5 tablets (12.5 mg total) by mouth 2 (two) times daily.     multivitamin with minerals Tabs tablet Take 1 tablet by mouth at bedtime. Centrum Silver   niacin 500 MG tablet Take 500 mg by mouth 2 (two) times daily with a meal.   omega-3 acid ethyl esters 1 g capsule Commonly known as:  LOVAZA Take by mouth 2 (two) times daily.   pantoprazole 40 MG tablet Commonly known as:  PROTONIX Take 1 tablet (40 mg total) by mouth 2 (two) times daily.   quinapril 5 MG tablet Commonly known as:  ACCUPRIL Take 1 tablet (5 mg total) by mouth at bedtime.   sitaGLIPtin 100 MG tablet Commonly known as:  JANUVIA Take 100 mg by mouth daily.       Allergies: No Known Allergies  Past Medical History:  Diagnosis Date  . 2nd degree AV block 04/29/2011   Medtronic Adapta  . AAA (abdominal aortic aneurysm) ([place]) 12/10/2012  . Abdominal aortic aneurysm ([facility])    4.1 x 4.3 cm  . Diabetes mellitus   . Dysrhythmia   . Encephalomalacia on imaging study   . Hypertension   . Obesity   . OSA (obstructive sleep apnea)   . PAD (peripheral artery disease) ([place])   . Paroxysmal atrial fibrillation ([place]) 03/19/2014   Asymptomatic, pacemaker detected   . Second degree AV block, Mobitz type II 03/19/2014  . SVT (supraventricular tachycardia) ([place]) 04/30/2011  . Tobacco abuse     Past Surgical History:  Procedure Laterality Date  . APPENDECTOMY  12/69  . BLADDER SURGERY  9/04,11/05,9/07   tumor removal  . BRAIN SURGERY    . COLONOSCOPY N/A 09/07/2015   Procedure: COLONOSCOPY;  Surgeon: Danie Binder, MD;  [facility];  Service: Endoscopy;  Laterality: N/A;  2:00 - moved to 1:45 - office to notify  . ESOPHAGOGASTRODUODENOSCOPY N/A 05/22/2015   TIR:WERXV duodenal ulcer/single ulcer in the gastris antrum/erosive gastritis  . ESOPHAGOGASTRODUODENOSCOPY N/A 09/07/2015   Procedure: ESOPHAGOGASTRODUODENOSCOPY (EGD);  Surgeon: Danie Binder, MD;  Location: [facility];  Service: Endoscopy;  Laterality: N/A;  . LOOP RECORDER EXPLANT  04/29/2011   Procedure:  LOOP RECORDER EXPLANT;  Surgeon: Sanda Klein, MD;  Location: [facility] CATH LAB;  Service: Cardiovascular;;  . NM MYOCAR PERF WALL MOTION  04/13/2010   moderate, mostly fixed  inferoseptal defect, suspicious for artifact.  Marland Kitchen PERMANENT PACEMAKER INSERTION  04/29/2011   Medtronic Adapta  . PERMANENT PACEMAKER INSERTION N/A 04/29/2011   Procedure: PERMANENT PACEMAKER INSERTION;  Surgeon: Sanda Klein, MD;  Location: [facility] CATH LAB;  Service: Cardiovascular;  Laterality: N/A;  . US ECHOCARDIOGRAPHY  04/13/2010   mild asymmetric LVH, EF >55%,mild mitral ca+,AOV mildly sclerotic    Family History  Problem Relation Age of Onset  . Diabetes Mother   . Diabetes Father   . Stroke Father   . Colon cancer Neg Hx     Social History:  reports that he has been smoking Cigarettes.  He has a 94.00 pack-year smoking history. He has never used smokeless tobacco. He reports that he drinks alcohol. He reports that he does not use drugs.   Review of Systems   Lipid history: Has high triglycerides as done by PCP, he is currently getting gemfibrozil  And Lipitor    Lab Results  Component Value Date   CHOL 190 11/10/2016   HDL 18.20 (L) 11/10/2016   LDLDIRECT 45.0 11/10/2016   TRIG (H) 11/10/2016    1047.0 Triglyceride is over 400; calculations on Lipids are invalid.   CHOLHDL 10 11/10/2016           Hypertension: Mild and treated with quinapril and amlodipine for several years  RENAL dysfunction: He thinks he was seen by nephrologist but he does not think any recommendations were made  Most recent eye exam was 6/18, reportedly no diabetic retinopathy, has history of cataract surgery  Most recent foot exam: 6/18    LABS:  No visits with results within 1 Week(s) from this visit.  Latest known visit with results is:  Lab on 11/10/2016  Component Date Value Ref Range Status  . Sodium 11/10/2016 133* 135 - 145 mEq/L Final  . Potassium 11/10/2016 5.1  3.5 - 5.1 mEq/L Final  . Chloride 11/10/2016  98  96 - 112 mEq/L Final  . CO2 11/10/2016 22  19 - 32 mEq/L Final  . Glucose, Bld 11/10/2016 254* 70 - 99 mg/dL Final  . BUN 11/10/2016 39* 6 - 23 mg/dL Final  . Creatinine, Ser 11/10/2016 2.77* 0.40 - 1.50 mg/dL Final  . Total Bilirubin 11/10/2016 0.3  0.2 - 1.2 mg/dL Final  . Alkaline Phosphatase 11/10/2016 81  39 - 117 U/L Final  . AST 11/10/2016 19  0 - 37 U/L Final  . ALT 11/10/2016 16  0 - 53 U/L Final  . Total Protein 11/10/2016 8.3  6.0 - 8.3 g/dL Final  . Albumin 11/10/2016 4.2  3.5 - 5.2 g/dL Final  . Calcium 11/10/2016 9.2  8.4 - 10.5 mg/dL Final  . GFR 11/10/2016 24.44* >60.00 mL/min Final  . Fructosamine 11/10/2016 525* 0 - 285 umol/L Final   Comment: Published reference interval for apparently healthy subjects between age 85 and 29 is 64 - 285 umol/L and in a poorly controlled diabetic population is 228 - 563 umol/L with a mean of 396 umol/L.   Marland Kitchen Cholesterol 11/10/2016 190  0 - 200 mg/dL Final   ATP III Classification       Desirable:  < 200 mg/dL               Borderline High:  200 - 239 mg/dL          High:  > = 240 mg/dL  . Triglycerides 11/10/2016 1047.0 Triglyceride is over 400; calculations on Lipids are invalid.* 0.0 - 149.0 mg/dL Final   Normal:  <150 mg/dLBorderline High:  150 - 199 mg/dL  . HDL 11/10/2016 18.20* >39.00 mg/dL Final  . Total CHOL/HDL Ratio 11/10/2016 10   Final  Men          Women1/2 Average Risk     3.4          3.3Average Risk          5.0          4.42X Average Risk          9.6          7.13X Average Risk          15.0          11.0                      . Direct LDL 11/10/2016 45.0  mg/dL Final   Optimal:  <100 mg/dLNear or Above Optimal:  100-129 mg/dLBorderline High:  130-159 mg/dLHigh:  160-189 mg/dLVery High:  >190 mg/dL    Physical Examination:  BP 114/64   Pulse 88   Ht 5\' 8"  (1.727 m)   Wt 219 lb (99.3 kg)   SpO2 93%   BMI 33.30 kg/m         ASSESSMENT:  Diabetes type 2, uncontrolled  See history of  present illness for detailed discussion of current diabetes management, blood sugar patterns and problems identified  His blood sugars are poorly controlled and worsening with glucose readings of 300-400 + now He has not started checking his blood sugars, still does not have glucose monitor With his fructosamine 525 and glucose now 440 today he appears to have significant glucose toxicity and will only benefit from taking insulin He is not able to purchase a GLP-1 drug because of high out-of-pocket expense Currently not watching his diet and drinking drinks with sugar  HYPERTENSION: Blood pressure is Low normal  RENAL dysfunction: Etiology unclear and not clear if he has seen the nephrologist, no information available currently His kidney function is significantly worse now   PLAN:     Demonstrated to the patient how to check his sugar with a One Touch Verio monitor  Also demonstrated in detail how to do an insulin injection with a pen  He was given a brochure on Humalog mix insulin with instructions on how to do the injection  He will start taking 15 units before breakfast and 10 before supper time  He will need to follow-up with nurse educator next week to reassess his progress  Discussed blood sugar targets  He will Stop metformin, Januvia as these aren't effective and metformin currently contraindicated  Since his blood pressure is low normal he will stop amlodipine for now also  He may be getting some renal toxicity with gemfibrozil and he can stop this temporarily   Patient Instructions  STOP Januvia and metformin.    Amlodipine to be stopped as blood pressure is low  Check blood sugars on waking up  daily  Also check blood sugars about 2 hours after a meal and do this after different meals by rotation  Recommended blood sugar levels on waking up is 90-130 and about 2 hours after meal is 130-160  Please bring your blood sugar monitor to each visit, thank  you  Start drinking all drinks with sugar including Gatorade  INSULIN: This needs to be taken with food twice a day  Take the first injection before your first meal and inject 15 units into the side of the abdomen.  Hold the pen down for 5 seconds after finishing the injection  Take the second injection daily before your last meal If not  eating at home take the insulin pen with you in your pocket    Counseling time on subjects discussed above is over 50% of today's 25 minute visit    Dustin Estes 11/17/2016, 3:25 PM   Note: This office note was prepared with Estate agent. Any transcriptional errors that result from this process are unintentional.

## 2016-11-17 NOTE — Patient Instructions (Signed)
STOP Januvia and metformin.    Amlodipine to be stopped as blood pressure is low  Check blood sugars on waking up  daily  Also check blood sugars about 2 hours after a meal and do this after different meals by rotation  Recommended blood sugar levels on waking up is 90-130 and about 2 hours after meal is 130-160  Please bring your blood sugar monitor to each visit, thank you  Start drinking all drinks with sugar including Gatorade  INSULIN: This needs to be taken with food twice a day  Take the first injection before your first meal and inject 15 units into the side of the abdomen.  Hold the pen down for 5 seconds after finishing the injection  Take the second injection daily before your last meal If not eating at home take the insulin pen with you in your pocket

## 2016-11-22 ENCOUNTER — Encounter: Payer: Medicare Other | Attending: Family Medicine | Admitting: Nutrition

## 2016-11-22 ENCOUNTER — Other Ambulatory Visit: Payer: Self-pay

## 2016-11-22 DIAGNOSIS — E1165 Type 2 diabetes mellitus with hyperglycemia: Secondary | ICD-10-CM | POA: Diagnosis present

## 2016-11-22 DIAGNOSIS — Z713 Dietary counseling and surveillance: Secondary | ICD-10-CM | POA: Insufficient documentation

## 2016-11-22 MED ORDER — INSULIN ASPART PROT & ASPART (70-30 MIX) 100 UNIT/ML ~~LOC~~ SUSP: SUBCUTANEOUS | 11 refills | Status: DC

## 2016-11-22 NOTE — Progress Notes (Signed)
Pt. Did not bring meter and did not test this AM. Says did not start insulin because drug store did not have it in stock.  Will deliver it to his house this afternoon.  He was shown how to mix the insulin in the pen, how to attach the needle, and how to inject.  Written instructions were given to him to take 15u 10 min. Before breakfast, and 10 units before supper.  He brought his meter with him, and said he has not used it as yet.  Reviewed how to use this, but did not want to plug it in to change it.  Gave him a Verio IQ with battery.  We tested his blood sugar, and it read HI.  Per doctor Kumar's order, 6u of Humalog was given to the patient at 8:30AM.  Pt. Attached the needle, dial up 6u, and gave himself the dose of insulin into the abdominal area without hesitation.  We reviewed the dose he is to start at home of 10u acS tonight, and 15u tomorrow before breakfast.  He reported good understanding of this. He came in drinking a large bottle of gatorade.  He was told to stop this and drink the "diet gatorade.  Breakfast is usually sweetened cereal and milk with banana.  He was told to stop this and switch to toast with peanut butter or toast with melted cheese.  He agreed to do this.   Lunch and supper appear adequate with no snacking between meals or after supper.  Written instructions given to stop drinking Gatorade, stop cold cereal and milk, and test blood sugar after taking insulin before breakfast and supper.  He reported good understanding of this with no final questions.

## 2016-11-22 NOTE — Patient Instructions (Signed)
1. stop drinking Gatorade,  2. stop cold cereal and milk,  3. test blood sugar before breakfast and supper. 4.  Take 15u of insulin before breakfast, and 10u before supper.

## 2016-11-24 ENCOUNTER — Other Ambulatory Visit: Payer: Self-pay

## 2016-11-24 MED ORDER — INSULIN PEN NEEDLE 31G X 5 MM MISC: 4 refills | Status: AC

## 2016-11-25 ENCOUNTER — Telehealth: Payer: Self-pay | Admitting: Endocrinology

## 2016-11-25 NOTE — Telephone Encounter (Signed)
Cover My Meds calling in reference to PA for Humalog.   Ref # [id number]  Please call Cover My Meds and advise.

## 2016-11-25 NOTE — Telephone Encounter (Signed)
This was switched to Novolog

## 2016-12-07 ENCOUNTER — Encounter: Payer: Self-pay | Admitting: Nutrition

## 2016-12-07 ENCOUNTER — Encounter: Payer: Self-pay | Admitting: Cardiovascular Disease

## 2016-12-07 ENCOUNTER — Ambulatory Visit (INDEPENDENT_AMBULATORY_CARE_PROVIDER_SITE_OTHER): Payer: Medicare Other | Admitting: Cardiovascular Disease

## 2016-12-07 VITALS — BP 100/60 | HR 88 | Ht 68.0 in | Wt 217.0 lb

## 2016-12-07 DIAGNOSIS — N189 Chronic kidney disease, unspecified: Secondary | ICD-10-CM

## 2016-12-07 DIAGNOSIS — E782 Mixed hyperlipidemia: Secondary | ICD-10-CM

## 2016-12-07 DIAGNOSIS — Z720 Tobacco use: Secondary | ICD-10-CM | POA: Diagnosis not present

## 2016-12-07 DIAGNOSIS — N289 Disorder of kidney and ureter, unspecified: Secondary | ICD-10-CM | POA: Diagnosis not present

## 2016-12-07 DIAGNOSIS — I739 Peripheral vascular disease, unspecified: Secondary | ICD-10-CM | POA: Diagnosis not present

## 2016-12-07 DIAGNOSIS — I714 Abdominal aortic aneurysm, without rupture, unspecified: Secondary | ICD-10-CM

## 2016-12-07 DIAGNOSIS — I441 Atrioventricular block, second degree: Secondary | ICD-10-CM | POA: Diagnosis not present

## 2016-12-07 DIAGNOSIS — E108 Type 1 diabetes mellitus with unspecified complications: Secondary | ICD-10-CM | POA: Diagnosis not present

## 2016-12-07 DIAGNOSIS — I480 Paroxysmal atrial fibrillation: Secondary | ICD-10-CM

## 2016-12-07 DIAGNOSIS — Z950 Presence of cardiac pacemaker: Secondary | ICD-10-CM | POA: Diagnosis not present

## 2016-12-07 NOTE — Patient Instructions (Addendum)
Dr Sallyanne Kuster recommends that you continue on your current medications as directed. Please refer to the Current Medication list given to you today.  Your physician recommends that you return for lab work TODAY.  Remote monitoring is used to monitor your Pacemaker or ICD from home. This monitoring reduces the number of office visits required to check your device to one time per year. It allows [place] to keep an eye on the functioning of your device to ensure it is working properly. You are scheduled for a device check from home on Wednesday, November 21st, 2018. You may send your transmission at any time that day. If you have a wireless device, the transmission will be sent automatically. After your physician reviews your transmission, you will receive a notification with your next transmission date.  Dr Sallyanne Kuster recommends that you schedule a follow-up appointment in 12 months with a pacemaker check. You will receive a reminder letter in the mail two months in advance. If you don't receive a letter, please call our office to schedule the follow-up appointment.  If you need a refill on your cardiac medications before your next appointment, please call your pharmacy.

## 2016-12-07 NOTE — Progress Notes (Signed)
Cardiology Office Note    Date:  12/07/2016   ID:  Dustin Hustead., DOB 1951-04-08, MRN [id number]  PCP:  [name], MD  Cardiologist:  K. Mali Hilty, MD; Sanda Klein, MD   Chief complaint : Pacemaker check.   History of Present Illness:  Dustin Parkerson. is a 67 y.o. male with a history of transient high-grade second-degree AV block and syncope, moderate size abdominal aortic aneurysm, peripheral arterial disease with bilateral superficial femoral artery stenoses,Obstructive sleep apnea noncompliant with CPAP, left ventricular diastolic dysfunction by echo without overt congestive heart failure, ongoing tobacco use, history of GI bleeding requiring transfusion related to gastric and water no ulcers, anemia related to bleeding and severe vitamin B12 deficiency. He presents today for pacemaker check.  He has not had problems with palpitations, syncope, dyspnea or angina. He denies leg edema and is not claudication (but is very sedentary). His endocrinologist, Dr. Dwyane Dee took him off "four medications". This appears to been related to worsening renal function with a creatinine of 2.2. He was referred to a nephrologist, but does not remember who he saw. He was prescribed trulicity but this was too expensive, a phone note states that he was then prescribed cheaper Ictoza, but he has not yet picked this up from the pharmacy.  His Medtronic Adapta dual-chamber pacemaker was implanted in 2013. Although he has very infrequent ventricular pacing, implantation of his pacemaker led to complete abolition of syncope. On pacemaker interrogation today he only has 0.4% ventricular pacing. Lead parameters remain excellent. Estimated generates longevity is 12 years. His device has recorded several episodes of brief paroxysmal atrial tachycardia and nonsustained ventricular tachycardia only a few seconds long. He has not had any new episodes of atrial fibrillation other than the brief spells recorded  during acute illness more than a year ago.    Past Medical History:  Diagnosis Date  . 2nd degree AV block 04/29/2011   Medtronic Adapta  . AAA (abdominal aortic aneurysm) ([place]) 12/10/2012  . Abdominal aortic aneurysm (HCC)    4.1 x 4.3 cm  . Diabetes mellitus   . Dysrhythmia   . Encephalomalacia on imaging study   . Hypertension   . Obesity   . OSA (obstructive sleep apnea)   . PAD (peripheral artery disease) ([place])   . Paroxysmal atrial fibrillation ([place]) 03/19/2014   Asymptomatic, pacemaker detected   . Second degree AV block, Mobitz type II 03/19/2014  . SVT (supraventricular tachycardia) ([place]) 04/30/2011  . Tobacco abuse     Past Surgical History:  Procedure Laterality Date  . APPENDECTOMY  12/69  . BLADDER SURGERY  9/04,11/05,9/07   tumor removal  . BRAIN SURGERY    . COLONOSCOPY N/A 09/07/2015   Procedure: COLONOSCOPY;  Surgeon: Danie Binder, MD;  [facility];  Service: Endoscopy;  Laterality: N/A;  2:00 - moved to 1:45 - office to notify  . ESOPHAGOGASTRODUODENOSCOPY N/A 05/22/2015   WUJ:WJXBJ duodenal ulcer/single ulcer in the gastris antrum/erosive gastritis  . ESOPHAGOGASTRODUODENOSCOPY N/A 09/07/2015   Procedure: ESOPHAGOGASTRODUODENOSCOPY (EGD);  Surgeon: Danie Binder, MD;  [facility];  Service: Endoscopy;  Laterality: N/A;  . LOOP RECORDER EXPLANT  04/29/2011   Procedure: LOOP RECORDER EXPLANT;  Surgeon: Sanda Klein, MD;  Location: [facility];  Service: Cardiovascular;;  . NM MYOCAR PERF WALL MOTION  04/13/2010   moderate, mostly fixed  inferoseptal defect, suspicious for artifact.  Marland Kitchen PERMANENT PACEMAKER INSERTION  04/29/2011   Medtronic Adapta  .  PERMANENT PACEMAKER INSERTION N/A 04/29/2011   Procedure: PERMANENT PACEMAKER INSERTION;  Surgeon: Sanda Klein, MD;  Location: [facility];  Service: Cardiovascular;  Laterality: N/A;  . US ECHOCARDIOGRAPHY  04/13/2010   mild asymmetric LVH, EF >55%,mild mitral ca+,AOV mildly sclerotic     Current Medications: Outpatient Medications Prior to Visit  Medication Sig Dispense Refill  . amLODipine (NORVASC) 5 MG tablet Take 5 mg by mouth daily.    Marland Kitchen atorvastatin (LIPITOR) 40 MG tablet Take 40 mg by mouth daily.    . cyanocobalamin 500 MCG tablet Take 1 tablet (500 mcg total) by mouth daily. 30 tablet 2  . Flaxseed, Linseed, 1000 MG CAPS Take 1 capsule by mouth 2 (two) times daily.     Marland Kitchen gemfibrozil (LOPID) 600 MG tablet Take 600 mg by mouth 2 (two) times daily before a meal.    . glipiZIDE (GLUCOTROL XL) 10 MG 24 hr tablet Take 1 tablet (10 mg total) by mouth daily with breakfast. 30 tablet 2  . glucose blood (ACCU-CHEK GUIDE) test strip Use to check blood sugar 3 times daily 100 each 12  . insulin aspart protamine- aspart (NOVOLOG MIX 70/30) (70-30) 100 UNIT/ML injection 15 units at breakfast and 10 units before supper 10 mL 11  . Insulin Pen Needle 31G X 5 MM MISC Use to inject insulin 2 times daily 90 each 4  . metoprolol tartrate (LOPRESSOR) 25 MG tablet Take 0.5 tablets (12.5 mg total) by mouth 2 (two) times daily. 60 tablet 2  . Multiple Vitamin (MULITIVITAMIN WITH MINERALS) TABS Take 1 tablet by mouth at bedtime. Centrum Silver    . omega-3 acid ethyl esters (LOVAZA) 1 g capsule Take by mouth 2 (two) times daily.    . pantoprazole (PROTONIX) 40 MG tablet Take 1 tablet (40 mg total) by mouth 2 (two) times daily. 60 tablet 2  . quinapril (ACCUPRIL) 5 MG tablet Take 1 tablet (5 mg total) by mouth at bedtime. 90 tablet 3  . vitamin B-12 (CYANOCOBALAMIN) 500 MCG tablet Take 500 mcg by mouth daily.    . Insulin Lispro Prot & Lispro (HUMALOG MIX 75/25 KWIKPEN) (75-25) 100 UNIT/ML Kwikpen 15 units before breakfast and 10 units before supper (Patient not taking: Reported on 12/07/2016) 15 mL 1  . metFORMIN (GLUCOPHAGE) 1000 MG tablet Take 1,000 mg by mouth 2 (two) times daily with a meal.      No facility-administered medications prior to visit.      Allergies:   Patient has no  known allergies.   Social History   Social History  . Marital status: Divorced    Spouse name: N/A  . Number of children: 2  . Years of education: GED   Occupational History  .      retired, owned Sales promotion account executive, [employer]  . Smoking status: Current Every Day Smoker    Packs/day: 2.00    Years: 47.00    Types: Cigarettes  . Smokeless tobacco: Never Used     Comment: 4 (10packs per caroton) cartons/month (01/13/14), 09/09/15 smoking 3 cartons every 2 weeks  . Alcohol use Yes     Comment: "hardly ever"  . Drug use: No  . Sexual activity: No   Other Topics Concern  . None   Social History Narrative   Single, lives alone   Caffeine use- coffee 2 cups/week     Family History:  The patient's family history includes Diabetes in his father and mother; Stroke in his  father.   ROS:   Please see the history of present illness.    ROS All other systems reviewed and are negative.   PHYSICAL EXAM:   VS:  BP 100/60   Pulse 88   Ht 5\' 8"  (1.727 m)   Wt 217 lb (98.4 kg)   BMI 32.99 kg/m     General: Alert, oriented x3, no distress, Mildly obese. Smells of tobacco and has a pack of cigarettes in his shirt pocket Head: no evidence of trauma, PERRL, EOMI, no exophtalmos or lid lag, no myxedema, no xanthelasma; normal ears, nose and oropharynx Neck: normal jugular venous pulsations and no hepatojugular reflux; brisk carotid pulses without delay and no carotid bruits Chest: clear to auscultation, no signs of consolidation by percussion or palpation, normal fremitus, symmetrical and full respiratory excursions, healthy subclavian pacemaker site.  Cardiovascular: normal position and quality of the apical impulse, regular rhythm, normal first and second heart sounds, no murmurs, rubs or gallops Abdomen: no tenderness or distention, no masses by palpation, no abnormal pulsatility or arterial bruits, normal bowel sounds, no hepatosplenomegaly Extremities: no  clubbing, cyanosis or edema; 2+ radial, ulnar and brachial pulses bilaterally; 2+ right femoral, posterior tibial and dorsalis pedis pulses; 2+ left femoral, posterior tibial and dorsalis pedis pulses; no subclavian or femoral bruits Neurological: grossly nonfocal Psych: euthymic mood, full affect  Wt Readings from Last 3 Encounters:  12/07/16 217 lb (98.4 kg)  12/07/16 217 lb (98.4 kg)  11/17/16 219 lb (99.3 kg)      Studies/Labs Reviewed:   EKG:  EKG is ordered today.  Recent Labs: 11/10/2016: ALT 16; BUN 39; Creatinine, Ser 2.77; Potassium 5.1; Sodium 133   Lipid Panel    Component Value Date/Time   CHOL 190 11/10/2016 1039   TRIG (H) 11/10/2016 1039    1047.0 Triglyceride is over 400; calculations on Lipids are invalid.   HDL 18.20 (L) 11/10/2016 1039   CHOLHDL 10 11/10/2016 1039   LDLDIRECT 45.0 11/10/2016 1039   Hemoglobin A1c May 2018 was 11.5%  ASSESSMENT:    1. Paroxysmal atrial fibrillation (HCC)   2. Second degree AV block   3. Pacemaker   4. Mixed hyperlipidemia   5. Abdominal aortic aneurysm (AAA) without rupture ([place])   6. PAD (peripheral artery disease) ([place])   7. Type 1 diabetes mellitus with complications (HCC)   8. Tobacco abuse   9. Acute on chronic renal insufficiency      PLAN:  In order of problems listed above:  1. AFib: Only very brief episodes lasting a handful of minutes were recorded in February 2017 when he had acute GI bleeding and was very ill. No atrial fibrillation has been recorded since. Not a good candidate for anticoagulation, especially with worsening renal function. Will continue to monitor via his pacemaker and only plan to recommend anticoagulation if lengthy episodes are documented. 2. Second degree AV block: Complicated by syncope in the past, but only occurring infrequently, with less than 1% ventricular paced. He is not pacemaker dependent. Continue remote downloads every 3 months and office visit at least yearly. 3. PPM:  Normal pacemaker function. He has not been compliant with remote downloads, but will try again. 4. HLP: He is on a combination of statin and fenofibrate and omega-3 fatty acid supplements. Triglycerides were very elevated when last checked, likely due to poor glycemic control. This probably also explains to severely decreased HDL. 5. AAA: Moderate in size and stable over time, last evaluated in May 2018. On  beta blocker. Continue ultrasound monitoring. 6. PAD: Asymptomatic, but also very sedentary. Unfortunately he continues to smoke and has poor glycemic control, which is a virtual guarantee for progression of disease 7. DM: Poorly controlled due to noncompliance with diet and medications. Encouraged him to go and fill the prescription recommended by Dr. Dwyane Dee today. 8. Smoking cessation strongly advised. 9. ARF: Hopefully this was mostly hypovolemia due to glucosuria. Lab slip given for repeat labs today.    Medication Adjustments/Labs and Tests Ordered: Current medicines are reviewed at length with the patient today.  Concerns regarding medicines are outlined above.  Medication changes, Labs and Tests ordered today are listed in the Patient Instructions below. Patient Instructions  Dr Sallyanne Kuster recommends that you continue on your current medications as directed. Please refer to the Current Medication list given to you today.  Your physician recommends that you return for lab work TODAY.  Remote monitoring is used to monitor your Pacemaker or ICD from home. This monitoring reduces the number of office visits required to check your device to one time per year. It allows [place] to keep an eye on the functioning of your device to ensure it is working properly. You are scheduled for a device check from home on Wednesday, November 21st, 2018. You may send your transmission at any time that day. If you have a wireless device, the transmission will be sent automatically. After your physician reviews your  transmission, you will receive a notification with your next transmission date.  Dr Sallyanne Kuster recommends that you schedule a follow-up appointment in 12 months with a pacemaker check. You will receive a reminder letter in the mail two months in advance. If you don't receive a letter, please call our office to schedule the follow-up appointment.  If you need a refill on your cardiac medications before your next appointment, please call your pharmacy.    Signed, Sanda Klein, MD  12/07/2016 1:54 PM    [facility] [address], [place], [place]  [address] Phone: [phone]; Fax: [phone]

## 2016-12-07 NOTE — Addendum Note (Signed)
Addended by: Milderd Meager on: 12/07/2016 11:55 AM   Modules accepted: Orders

## 2016-12-08 LAB — BASIC METABOLIC PANEL
BUN/Creatinine Ratio: 13 (ref 10–24)
BUN: 19 mg/dL (ref 8–27)
CALCIUM: 9.4 mg/dL (ref 8.6–10.2)
CO2: 22 mmol/L (ref 20–29)
CREATININE: 1.42 mg/dL — AB (ref 0.76–1.27)
Chloride: 95 mmol/L — ABNORMAL LOW (ref 96–106)
GFR, EST AFRICAN AMERICAN: 59 mL/min/{1.73_m2} — AB (ref >59)
GFR, EST NON AFRICAN AMERICAN: 51 mL/min/{1.73_m2} — AB (ref >59)
Glucose: 482 mg/dL — ABNORMAL HIGH (ref 65–99)
Potassium: 5.3 mmol/L — ABNORMAL HIGH (ref 3.5–5.2)
SODIUM: 137 mmol/L (ref 134–144)

## 2016-12-13 ENCOUNTER — Encounter: Payer: Medicare Other | Admitting: Nutrition

## 2016-12-13 ENCOUNTER — Other Ambulatory Visit: Payer: Self-pay

## 2016-12-13 ENCOUNTER — Encounter: Payer: Self-pay | Admitting: Endocrinology

## 2016-12-13 ENCOUNTER — Ambulatory Visit (INDEPENDENT_AMBULATORY_CARE_PROVIDER_SITE_OTHER): Payer: Medicare Other | Admitting: Endocrinology

## 2016-12-13 VITALS — BP 130/74 | HR 104 | Ht 68.0 in | Wt 220.6 lb

## 2016-12-13 DIAGNOSIS — E108 Type 1 diabetes mellitus with unspecified complications: Secondary | ICD-10-CM

## 2016-12-13 DIAGNOSIS — E1165 Type 2 diabetes mellitus with hyperglycemia: Secondary | ICD-10-CM | POA: Diagnosis not present

## 2016-12-13 LAB — GLUCOSE, POCT (MANUAL RESULT ENTRY): POC GLUCOSE: 568 mg/dL — AB (ref 70–99)

## 2016-12-13 LAB — POCT GLYCOSYLATED HEMOGLOBIN (HGB A1C): HEMOGLOBIN A1C: 14.1

## 2016-12-13 MED ORDER — "INSULIN SYRINGE 30G X 5/16"" 0.5 ML MISC": 3 refills | Status: AC

## 2016-12-13 NOTE — Patient Instructions (Addendum)
Insulin 25 units BEFORE BREAKFAST AND SUPPER

## 2016-12-13 NOTE — Progress Notes (Signed)
Pt. Is using a Humalog Mix pen, and will be switching to a vial and syringe due to cost.  He will be buying Novolin 70/30 instead. He has one vial of Humalog Mix, and was shown how to draw up the insulin using a 0.5cc insulin syringe.  He re demonstrated how to do this correctly and then gave a injection of 20u at this time (10AM)per voice order from Dr. Dwyane Dee that was re verbalized.   We reviewed the need to take 25u 5 min. Before each meal until he starts on the new insulin from the drug store.  Written instructions were given for 25u 30 min. Before breakfast and supper.   We reviewed how to use the Bayer meter, and he uses it correctly, but he says it keeps saying "HI"  I explained that this means that his readings are over 500mg ., and this is very high.  He was told to call us if readings do not come down below 200 in the next week. He was encouraged to make sure he does not miss his dose of insulin before breakfast and supper, and he reported good understanding of this.   He was given samples of 0.5 cc insulin syringes with a short needle. He had no final questions.

## 2016-12-13 NOTE — Progress Notes (Signed)
Patient ID: Dustin Estes., male   DOB: 1950/11/13, 66 y.o.   MRN: [id number]          Reason for Appointment: Follow-up for Type 2 Diabetes  Referring physician: Tollie Pizza   History of Present Illness:          Date of diagnosis of type 2 diabetes mellitus:  1990s      Background history:   Patient has little recall about his medication history and has been on various medications over the years for his diabetes Has mostly taken metformin long-term and no injectable drugs His blood sugars have been more significantly out of control in the last year or so with A1c consistently over 9% since 11/17 Not clear if his medications have been changed last year  Recent history:   INSULIN regimen: Humalog mix 15 minutes before breakfast and 10 before supper  Current management, blood sugar patterns and problems identified:  He was trained how to do the insulin injection on his last visit and he thinks he is doing it correctly with injecting it in the abdomen on either side and using the correct technique  He thinks he is using 15 units in the morning and 10 units at suppertime  He tried to check his blood sugar and hasn't done it twice and he was getting a reading of over 500, blood sugar is over 500 again today in the office  Now he says that he is not sure how to check his blood sugar and did not bring his meter  He states thirsty but is now drinking water or sugar-free drinks        Side effects from medications have been: None  Compliance with the medical regimen: Poor  Glucose monitoring:  not done recently       Glucometer:  ?  Accu-Chek   Self-care: The diet that the patient has been following is none  Dietician visit, most recent: 20 years ago  CDE visit: 7/18               Exercise:  minimal  Weight history:  Wt Readings from Last 3 Encounters:  12/13/16 220 lb 9.6 oz (100.1 kg)  12/07/16 217 lb (98.4 kg)  12/07/16 217 lb (98.4 kg)    Glycemic control:   Lab  Results  Component Value Date   HGBA1C 11.5 08/22/2016   HGBA1C 5.7 (H) 05/21/2015   Lab Results  Component Value Date   MICROALBUR 2.1 (H) 11/17/2016   CREATININE 1.42 (H) 12/07/2016   Lab Results  Component Value Date   MICRALBCREAT 4.4 11/17/2016    Lab Results  Component Value Date   FRUCTOSAMINE 525 (H) 11/10/2016    Microalbumin/creatinine ratio was 76 in 2016   Allergies as of 12/13/2016   No Known Allergies     Medication List       Accurate as of 12/13/16  9:50 AM. Always use your most recent med list.          amLODipine 5 MG tablet Commonly known as:  NORVASC Take 5 mg by mouth daily.   atorvastatin 40 MG tablet Commonly known as:  LIPITOR Take 40 mg by mouth daily.   vitamin B-12 500 MCG tablet Commonly known as:  CYANOCOBALAMIN Take 500 mcg by mouth daily.   cyanocobalamin 500 MCG tablet Take 1 tablet (500 mcg total) by mouth daily.   Flaxseed (Linseed) 1000 MG Caps Take 1 capsule by mouth 2 (two) times daily.   gemfibrozil  600 MG tablet Commonly known as:  LOPID Take 600 mg by mouth 2 (two) times daily before a meal.   glipiZIDE 10 MG 24 hr tablet Commonly known as:  GLUCOTROL XL Take 1 tablet (10 mg total) by mouth daily with breakfast.   glucose blood test strip Commonly known as:  ACCU-CHEK GUIDE Use to check blood sugar 3 times daily   insulin aspart protamine- aspart (70-30) 100 UNIT/ML injection Commonly known as:  NOVOLOG MIX 70/30 15 units at breakfast and 10 units before supper   Insulin Pen Needle 31G X 5 MM Misc Use to inject insulin 2 times daily   metoprolol tartrate 25 MG tablet Commonly known as:  LOPRESSOR Take 0.5 tablets (12.5 mg total) by mouth 2 (two) times daily.   multivitamin with minerals Tabs tablet Take 1 tablet by mouth at bedtime. Centrum Silver   omega-3 acid ethyl esters 1 g capsule Commonly known as:  LOVAZA Take by mouth 2 (two) times daily.   pantoprazole 40 MG tablet Commonly known as:   PROTONIX Take 1 tablet (40 mg total) by mouth 2 (two) times daily.   quinapril 5 MG tablet Commonly known as:  ACCUPRIL Take 1 tablet (5 mg total) by mouth at bedtime.       Allergies: No Known Allergies  Past Medical History:  Diagnosis Date  . 2nd degree AV block 04/29/2011   Medtronic Adapta  . AAA (abdominal aortic aneurysm) ([place]) 12/10/2012  . Abdominal aortic aneurysm (HCC)    4.1 x 4.3 cm  . Diabetes mellitus   . Dysrhythmia   . Encephalomalacia on imaging study   . Hypertension   . Obesity   . OSA (obstructive sleep apnea)   . PAD (peripheral artery disease) ([place])   . Paroxysmal atrial fibrillation ([place]) 03/19/2014   Asymptomatic, pacemaker detected   . Second degree AV block, Mobitz type II 03/19/2014  . SVT (supraventricular tachycardia) ([place]) 04/30/2011  . Tobacco abuse     Past Surgical History:  Procedure Laterality Date  . APPENDECTOMY  12/69  . BLADDER SURGERY  9/04,11/05,9/07   tumor removal  . BRAIN SURGERY    . COLONOSCOPY N/A 09/07/2015   Procedure: COLONOSCOPY;  Surgeon: Danie Binder, MD;  [facility];  Service: Endoscopy;  Laterality: N/A;  2:00 - moved to 1:45 - office to notify  . ESOPHAGOGASTRODUODENOSCOPY N/A 05/22/2015   WPY:KDXIP duodenal ulcer/single ulcer in the gastris antrum/erosive gastritis  . ESOPHAGOGASTRODUODENOSCOPY N/A 09/07/2015   Procedure: ESOPHAGOGASTRODUODENOSCOPY (EGD);  Surgeon: Danie Binder, MD;  [facility];  Service: Endoscopy;  Laterality: N/A;  . LOOP RECORDER EXPLANT  04/29/2011   Procedure: LOOP RECORDER EXPLANT;  Surgeon: Sanda Klein, MD;  Location: [facility];  Service: Cardiovascular;;  . NM MYOCAR PERF WALL MOTION  04/13/2010   moderate, mostly fixed  inferoseptal defect, suspicious for artifact.  Marland Kitchen PERMANENT PACEMAKER INSERTION  04/29/2011   Medtronic Adapta  . PERMANENT PACEMAKER INSERTION N/A 04/29/2011   Procedure: PERMANENT PACEMAKER INSERTION;  Surgeon: Sanda Klein, MD;  Location:  [facility];  Service: Cardiovascular;  Laterality: N/A;  . US ECHOCARDIOGRAPHY  04/13/2010   mild asymmetric LVH, EF >55%,mild mitral ca+,AOV mildly sclerotic    Family History  Problem Relation Age of Onset  . Diabetes Mother   . Diabetes Father   . Stroke Father   . Colon cancer Neg Hx     Social History:  reports that he has been smoking Cigarettes.  He has  a 94.00 pack-year smoking history. He has never used smokeless tobacco. He reports that he drinks alcohol. He reports that he does not use drugs.   Review of Systems   Lipid history: Has high triglycerides as done by PCP, he is currently getting gemfibrozil  And Lipitor    Lab Results  Component Value Date   CHOL 190 11/10/2016   HDL 18.20 (L) 11/10/2016   LDLDIRECT 45.0 11/10/2016   TRIG (H) 11/10/2016    1047.0 Triglyceride is over 400; calculations on Lipids are invalid.   CHOLHDL 10 11/10/2016           Hypertension: Mild and treated with quinapril and amlodipine for several years  RENAL dysfunction:Followed by nephrology  Most recent eye exam was 6/18, reportedly no diabetic retinopathy, has history of cataract surgery  Most recent foot exam: 6/18    LABS:  Office Visit on 12/07/2016  Component Date Value Ref Range Status  . Glucose 12/07/2016 482* 65 - 99 mg/dL Final  . BUN 12/07/2016 19  8 - 27 mg/dL Final  . Creatinine, Ser 12/07/2016 1.42* 0.76 - 1.27 mg/dL Final  . GFR calc non Af Amer 12/07/2016 51* >59 mL/min/1.73 Final  . GFR calc Af Amer 12/07/2016 59* >59 mL/min/1.73 Final  . BUN/Creatinine Ratio 12/07/2016 13  10 - 24 Final  . Sodium 12/07/2016 137  134 - 144 mmol/L Final  . Potassium 12/07/2016 5.3* 3.5 - 5.2 mmol/L Final  . Chloride 12/07/2016 95* 96 - 106 mmol/L Final  . CO2 12/07/2016 22  20 - 29 mmol/L Final  . Calcium 12/07/2016 9.4  8.6 - 10.2 mg/dL Final    Physical Examination:  BP 130/74   Pulse (!) 104   Ht 5\' 8"  (1.727 m)   Wt 220 lb 9.6 oz (100.1 kg)   SpO2 92%    BMI 33.54 kg/m         ASSESSMENT:  Diabetes type 2, uncontrolled  See history of present illness for detailed discussion of current diabetes management, blood sugar patterns and problems identified  His blood sugars are poorly controlled and worsening with glucose readings of over 4-500 despite starting small doses of insulin  HYPERTENSION: Blood pressure is  normal  RENAL dysfunction: Somewhat better  PLAN:     Demonstrated to the patient again how to do the insulin injection with the pen  He will see the nurse educator and try to learn how to do his insulin with a syringe today since he wants to switch to the Novolin 70/30 which is less expensive than the Humalog mix that he is using  Increase insulin to 25 units twice a day  He will call his blood sugar readings in one week  Follow-up in 3-4 weeks   Patient Instructions  Insulin 25 units BEFORE BREAKFAST AND SUPPER       Zackaria Burkey 12/13/2016, 9:50 AM   Note: This office note was prepared with Estate agent. Any transcriptional errors that result from this process are unintentional.

## 2016-12-13 NOTE — Patient Instructions (Signed)
When using this vial of insulin, take 25u 5-10 minutes before breakfast and supper When starting on the next vial of insulin (novolin 70/30), you will take the same dose, but wait 30 min. Before eating.

## 2017-01-18 ENCOUNTER — Telehealth: Payer: Self-pay | Admitting: *Deleted

## 2017-01-18 NOTE — Telephone Encounter (Signed)
Patient called and states he received a call form [facility] on [address] to refill his Novolog. Patient states he doesn't need a refill at this time and that the RX was sent to the wrong pharmacy . His pharmacy needs to be changed to [facility] [phone]. Please advise. Thank you

## 2017-01-18 NOTE — Telephone Encounter (Signed)
I have updated his pharmacy to the [facility] in his chart.

## 2017-02-07 ENCOUNTER — Other Ambulatory Visit: Payer: Self-pay | Admitting: Endocrinology

## 2017-02-17 ENCOUNTER — Encounter: Payer: Self-pay | Admitting: Endocrinology

## 2017-02-17 ENCOUNTER — Ambulatory Visit (INDEPENDENT_AMBULATORY_CARE_PROVIDER_SITE_OTHER): Payer: Medicare Other | Admitting: Endocrinology

## 2017-02-17 VITALS — BP 114/68 | HR 90 | Ht 68.0 in | Wt 218.6 lb

## 2017-02-17 DIAGNOSIS — E782 Mixed hyperlipidemia: Secondary | ICD-10-CM

## 2017-02-17 DIAGNOSIS — E1165 Type 2 diabetes mellitus with hyperglycemia: Secondary | ICD-10-CM

## 2017-02-17 DIAGNOSIS — I10 Essential (primary) hypertension: Secondary | ICD-10-CM

## 2017-02-17 DIAGNOSIS — Z794 Long term (current) use of insulin: Secondary | ICD-10-CM

## 2017-02-17 LAB — BASIC METABOLIC PANEL
BUN: 31 mg/dL — ABNORMAL HIGH (ref 6–23)
CALCIUM: 9.4 mg/dL (ref 8.4–10.5)
CO2: 29 mEq/L (ref 19–32)
Chloride: 98 mEq/L (ref 96–112)
Creatinine, Ser: 1.57 mg/dL — ABNORMAL HIGH (ref 0.40–1.50)
GFR: 47.02 mL/min — AB (ref >60.00)
Glucose, Bld: 273 mg/dL — ABNORMAL HIGH (ref 70–99)
POTASSIUM: 4.4 meq/L (ref 3.5–5.1)
SODIUM: 136 meq/L (ref 135–145)

## 2017-02-17 LAB — LIPID PANEL
CHOL/HDL RATIO: 8
CHOLESTEROL: 186 mg/dL (ref 0–200)
HDL: 22.6 mg/dL — AB (ref >39.00)
Triglycerides: 1197 mg/dL — ABNORMAL HIGH (ref 0.0–149.0)

## 2017-02-17 LAB — LDL CHOLESTEROL, DIRECT: Direct LDL: 37 mg/dL

## 2017-02-17 NOTE — Patient Instructions (Addendum)
INSULIN  35 units 15 minutes before breakfast and 25 UNITS before supper  Get insulin from [facility]

## 2017-02-17 NOTE — Progress Notes (Signed)
Patient ID: Dustin Morgans., male   DOB: [birth year], 66 y.o.   MRN: [id number]          Reason for Appointment: Follow-up for Type 2 Diabetes  Referring physician: Tollie Pizza   History of Present Illness:          Date of diagnosis of type 2 diabetes mellitus:  1990s      Background history:   Patient has little recall about his medication history and has been on various medications over the years for his diabetes Has mostly taken metformin long-term and no injectable drugs His blood sugars have been more significantly out of control in the last year or so with A1c consistently over 9% since 11/17 Not clear if his medications have been changed last year  Recent history:   INSULIN regimen: NovoLog mix 25 units 15 minutes before breakfast and 25 before supper  Oral hypoglycemic drugs: Metformin 1000 mg, half tablet twice a day  Current management, blood sugar patterns and problems identified:  He says he has been able to use the syringe to do his injection, using the insulin vial because of lower cost of the premixed insulin for this; still complaining about the cost of his insulin  He was told to increase his insulin on the last visit and not clear if his blood sugars are better since he has not monitored at home  However despite instructions is only taking his evening insulin at bedtime instead of at suppertime  Glucose was over 200 today in the office despite his not eating this morning and taking his morning injection  He says he is trying to avoid high sugar drinks  Needs assessment of his diet by dietitian  Although he was able to start checking his sugars previously he says that he is not able to get his meter to work for him now         Side effects from medications have been: None  Compliance with the medical regimen: Poor  Glucose monitoring:  not done recently       Glucometer:  ?  Contour  Sugar in the office 249  Self-care: The diet that the patient has been  following is none  Dietician visit, most recent: 20 years ago  CDE visit: 7/18, 8/18               Exercise:  minimal  Weight history:  Wt Readings from Last 3 Encounters:  02/17/17 218 lb 9.6 oz (99.2 kg)  12/13/16 220 lb 9.6 oz (100.1 kg)  12/07/16 217 lb (98.4 kg)    Glycemic control:   Lab Results  Component Value Date   HGBA1C 14.1 12/13/2016   HGBA1C 11.5 08/22/2016   HGBA1C 5.7 (H) 05/21/2015   Lab Results  Component Value Date   MICROALBUR 2.1 (H) 11/17/2016   CREATININE 1.57 (H) 02/17/2017   Lab Results  Component Value Date   MICRALBCREAT 4.4 11/17/2016    Lab Results  Component Value Date   FRUCTOSAMINE 556 (H) 02/17/2017   FRUCTOSAMINE 525 (H) 11/10/2016    Microalbumin/creatinine ratio was 76 in 2016   Allergies as of 02/17/2017   No Known Allergies     Medication List       Accurate as of 02/17/17 11:59 PM. Always use your most recent med list.          amLODipine 5 MG tablet Commonly known as:  NORVASC Take 5 mg by mouth daily.   atorvastatin 40 MG tablet  Commonly known as:  LIPITOR Take 40 mg by mouth daily.   vitamin B-12 500 MCG tablet Commonly known as:  CYANOCOBALAMIN Take 500 mcg by mouth daily.   cyanocobalamin 500 MCG tablet Take 1 tablet (500 mcg total) by mouth daily.   Flaxseed (Linseed) 1000 MG Caps Take 1 capsule by mouth 2 (two) times daily.   gemfibrozil 600 MG tablet Commonly known as:  LOPID Take 600 mg by mouth 2 (two) times daily before a meal.   GLIPIZIDE XL 10 MG 24 hr tablet Generic drug:  glipiZIDE TAKE ONE TABLET BY MOUTH EVERY DAY WITH BREAKFAST.   glucose blood test strip Commonly known as:  ACCU-CHEK GUIDE Use to check blood sugar 3 times daily   insulin aspart protamine- aspart (70-30) 100 UNIT/ML injection Commonly known as:  NOVOLOG MIX 70/30 15 units at breakfast and 10 units before supper   Insulin Pen Needle 31G X 5 MM Misc Use to inject insulin 2 times daily   INSULIN SYRINGE  .5CC/30GX5/16" 30G X 5/16" 0.5 ML Misc Use to inject insulin twice daily   metFORMIN 1000 MG tablet Commonly known as:  GLUCOPHAGE   metoprolol tartrate 25 MG tablet Commonly known as:  LOPRESSOR Take 0.5 tablets (12.5 mg total) by mouth 2 (two) times daily.   multivitamin with minerals Tabs tablet Take 1 tablet by mouth at bedtime. Centrum Silver   omega-3 acid ethyl esters 1 g capsule Commonly known as:  LOVAZA Take by mouth 2 (two) times daily.   pantoprazole 40 MG tablet Commonly known as:  PROTONIX Take 1 tablet (40 mg total) by mouth 2 (two) times daily.   quinapril 5 MG tablet Commonly known as:  ACCUPRIL Take 1 tablet (5 mg total) by mouth at bedtime.       Allergies: No Known Allergies  Past Medical History:  Diagnosis Date  . 2nd degree AV block 04/29/2011   Medtronic Adapta  . AAA (abdominal aortic aneurysm) ([place]) 12/10/2012  . Abdominal aortic aneurysm ([facility])    4.1 x 4.3 cm  . Diabetes mellitus   . Dysrhythmia   . Encephalomalacia on imaging study   . Hypertension   . Obesity   . OSA (obstructive sleep apnea)   . PAD (peripheral artery disease) ([place])   . Paroxysmal atrial fibrillation ([place]) 03/19/2014   Asymptomatic, pacemaker detected   . Second degree AV block, Mobitz type II 03/19/2014  . SVT (supraventricular tachycardia) ([place]) 04/30/2011  . Tobacco abuse     Past Surgical History:  Procedure Laterality Date  . APPENDECTOMY  12/69  . BLADDER SURGERY  9/04,11/05,9/07   tumor removal  . BRAIN SURGERY    . COLONOSCOPY N/A 09/07/2015   Procedure: COLONOSCOPY;  Surgeon: Danie Binder, MD;  [facility];  Service: Endoscopy;  Laterality: N/A;  2:00 - moved to 1:45 - office to notify  . ESOPHAGOGASTRODUODENOSCOPY N/A 05/22/2015   YPP:JKDTO duodenal ulcer/single ulcer in the gastris antrum/erosive gastritis  . ESOPHAGOGASTRODUODENOSCOPY N/A 09/07/2015   Procedure: ESOPHAGOGASTRODUODENOSCOPY (EGD);  Surgeon: Danie Binder, MD;  Location: [facility]  END[facility];  Service: Endoscopy;  Laterality: N/A;  . LOOP RECORDER EXPLANT  04/29/2011   Procedure: LOOP RECORDER EXPLANT;  Surgeon: Sanda Klein, MD;  Location: [facility];  Service: Cardiovascular;;  . NM MYOCAR PERF WALL MOTION  04/13/2010   moderate, mostly fixed  inferoseptal defect, suspicious for artifact.  Marland Kitchen PERMANENT PACEMAKER INSERTION  04/29/2011   Medtronic Adapta  . PERMANENT PACEMAKER INSERTION N/A 04/29/2011  Procedure: PERMANENT PACEMAKER INSERTION;  Surgeon: Sanda Klein, MD;  Location: [facility];  Service: Cardiovascular;  Laterality: N/A;  . US ECHOCARDIOGRAPHY  04/13/2010   mild asymmetric LVH, EF >55%,mild mitral ca+,AOV mildly sclerotic    Family History  Problem Relation Age of Onset  . Diabetes Mother   . Diabetes Father   . Stroke Father   . Colon cancer Neg Hx     Social History:  reports that he has been smoking Cigarettes.  He has a 94.00 pack-year smoking history. He has never used smokeless tobacco. He reports that he drinks alcohol. He reports that he does not use drugs.   Review of Systems   Lipid history: Has high triglycerides as done by PCP, he is currently getting Lovaza from unknown prescribed, gemfibrozil  And Lipitor    Lab Results  Component Value Date   CHOL 186 02/17/2017   HDL 22.60 (L) 02/17/2017   LDLDIRECT 37.0 02/17/2017   TRIG (H) 02/17/2017    1197.0 Triglyceride is over 400; calculations on Lipids are invalid.   CHOLHDL 8 02/17/2017           Hypertension: Mild and treated with quinapril and amlodipine for several years  RENAL dysfunction:Followed by nephrology  Lab Results  Component Value Date   CREATININE 1.57 (H) 02/17/2017   CREATININE 1.42 (H) 12/07/2016   CREATININE 2.77 (H) 11/10/2016     Most recent eye exam was 6/18, reportedly no diabetic retinopathy, has history of cataract surgery  Most recent foot exam: 6/18    LABS:  Office Visit on 02/17/2017  Component Date Value Ref Range Status  .  Sodium 02/17/2017 136  135 - 145 mEq/L Final  . Potassium 02/17/2017 4.4  3.5 - 5.1 mEq/L Final  . Chloride 02/17/2017 98  96 - 112 mEq/L Final  . CO2 02/17/2017 29  19 - 32 mEq/L Final  . Glucose, Bld 02/17/2017 273* 70 - 99 mg/dL Final  . BUN 02/17/2017 31* 6 - 23 mg/dL Final  . Creatinine, Ser 02/17/2017 1.57* 0.40 - 1.50 mg/dL Final  . Calcium 02/17/2017 9.4  8.4 - 10.5 mg/dL Final  . GFR 02/17/2017 47.02* >60.00 mL/min Final  . Fructosamine 02/17/2017 556* 0 - 285 umol/L Final   Comment: Published reference interval for apparently healthy subjects between age 42 and 24 is 62 - 285 umol/L and in a poorly controlled diabetic population is 228 - 563 umol/L with a mean of 396 umol/L.   Marland Kitchen Cholesterol 02/17/2017 186  0 - 200 mg/dL Final   ATP III Classification       Desirable:  < 200 mg/dL               Borderline High:  200 - 239 mg/dL          High:  > = 240 mg/dL  . Triglycerides 02/17/2017 1197.0 Triglyceride is over 400; calculations on Lipids are invalid.* 0.0 - 149.0 mg/dL Final   Normal:  <150 mg/dLBorderline High:  150 - 199 mg/dL  . HDL 02/17/2017 22.60* >39.00 mg/dL Final  . Total CHOL/HDL Ratio 02/17/2017 8   Final                  Men          Women1/2 Average Risk     3.4          3.3Average Risk          5.0  4.42X Average Risk          9.6          7.13X Average Risk          15.0          11.0                      . Direct LDL 02/17/2017 37.0  mg/dL Final   Optimal:  <100 mg/dLNear or Above Optimal:  100-129 mg/dLBorderline High:  130-159 mg/dLHigh:  160-189 mg/dLVery High:  >190 mg/dL    Physical Examination:  BP 114/68   Pulse 90   Ht 5\' 8"  (1.727 m)   Wt 218 lb 9.6 oz (99.2 kg)   SpO2 94%   BMI 33.24 kg/m         ASSESSMENT:  Diabetes type 2, uncontrolled  See history of present illness for  discussion of current diabetes management, blood sugar patterns and problems identified  His blood sugars are Still relatively poorly controlled Although  he has gone up to 50 units a day on his insulin and his blood sugars are still high in the office, He is still needing considerable amount of diabetes education to know what to do with his insulin, diet, glucose monitoring Currently taking evening insulin inappropriately at bedtime despite reminders He also limited by his finances to get only the least expensive insulin and only one kind Although he has started metformin on his own need to be sure that his renal function is adequate for this and adjust dose accordingly  HYPERTENSION: Blood pressure is  normal  RENAL dysfunction: Needs follow-up   PLAN:    Increase insulin to 35 units in the morning  Change evening insulin to suppertime instead of bedtime  Also he can switch to a [facility] brand insulin so that this is not expensive for him  His insulin instructions for home including timing of insulin, dosage reviewed in detail  Reminded her not to take insulin if he is not going to eat  He will be scheduled to see the dietitian educator to start glucose monitoring again and to review his day-to-day management and guidance on insulin adjustment  Discussed blood sugar targets at various times and nature of premixed insulin  Continue metformin 500 mg 3 times a day for now  Follow-up in 3-4 weeks   Patient Instructions  INSULIN  35 units 15 minutes before breakfast and 25 UNITS before supper  Get insulin from [place] time on subjects discussed in assessment and plan sections is over 50% of today's 25 minute visit    Gailya Tauer 02/18/2017, 2:37 PM   Note: This office note was prepared with Dragon voice recognition system technology. Any transcriptional errors that result from this process are unintentional.   Addendum: Creatinine 1.57, continue 500 twice a day of metformin Triglycerides 1200, need to confirm that he is taking his Lovaza and gemfibrozil  02/18/17

## 2017-02-18 LAB — FRUCTOSAMINE: FRUCTOSAMINE: 556 umol/L — AB (ref 0–285)

## 2017-02-18 LAB — GLUCOSE, POCT (MANUAL RESULT ENTRY): POC GLUCOSE: 249 mg/dL — AB (ref 70–99)

## 2017-02-23 ENCOUNTER — Telehealth: Payer: Self-pay | Admitting: Endocrinology

## 2017-02-23 NOTE — Telephone Encounter (Signed)
Left a vm requesting patient call back to discuss labs

## 2017-02-23 NOTE — Telephone Encounter (Signed)
Patient returned your call. Please call patient at whichever number you called him before.

## 2017-02-24 ENCOUNTER — Telehealth: Payer: Self-pay | Admitting: Endocrinology

## 2017-02-24 NOTE — Telephone Encounter (Signed)
Left a detailed message with his lab results

## 2017-02-24 NOTE — Telephone Encounter (Signed)
The medication the patient quit taking is: Quinapril. Dr. Rockey Situ patient he was going to call a new prescription (less expensive)  Into pharmacy-[facility]ph# [phone] (pharmacy has not received prescription yet)

## 2017-02-24 NOTE — Telephone Encounter (Signed)
Patient is returning your call.  

## 2017-02-27 NOTE — Telephone Encounter (Signed)
I have discontinued this medication and will call with results as soon as I can.

## 2017-02-27 NOTE — Telephone Encounter (Signed)
We can remove the medication from the list.  Also please let him know about the result note in case that has not been discussed

## 2017-02-27 NOTE — Telephone Encounter (Signed)
Please see message.  Please advise if you want me to remove this medication from patient chart.

## 2017-03-03 NOTE — Telephone Encounter (Signed)
Will continue to call to give results.

## 2017-03-06 ENCOUNTER — Other Ambulatory Visit: Payer: Self-pay

## 2017-03-06 ENCOUNTER — Telehealth: Payer: Self-pay | Admitting: Endocrinology

## 2017-03-06 ENCOUNTER — Telehealth: Payer: Self-pay

## 2017-03-06 MED ORDER — INSULIN LISPRO PROT & LISPRO (75-25 MIX) 100 UNIT/ML KWIKPEN: PEN_INJECTOR | SUBCUTANEOUS | 3 refills | Status: DC

## 2017-03-06 NOTE — Telephone Encounter (Signed)
Patient is calling for lab results, and have question about medication please advise.

## 2017-03-06 NOTE — Telephone Encounter (Signed)
Ok I switched and sent in new prescription- patient has been notified

## 2017-03-06 NOTE — Telephone Encounter (Signed)
He can switch to Humalog mix otherwise he will have to get Novolin 70/30 from [place] with a syringe

## 2017-03-06 NOTE — Telephone Encounter (Signed)
I called the patient back and he hung up before I was able to speak. I called him again and left a voice message. We have called him numerous times and not able to reach him so I will mail his lab results in a letter.

## 2017-03-06 NOTE — Telephone Encounter (Signed)
Patient called today and stated his insurance will not cover Novolog mix anymore- ok to switch to Humalog 75/25 same dose as last office note? Please advise

## 2017-03-08 ENCOUNTER — Other Ambulatory Visit: Payer: Self-pay

## 2017-03-08 MED ORDER — INSULIN LISPRO PROT & LISPRO (75-25 MIX) 100 UNIT/ML KWIKPEN: PEN_INJECTOR | SUBCUTANEOUS | 3 refills | Status: DC

## 2017-03-15 ENCOUNTER — Other Ambulatory Visit: Payer: Self-pay | Admitting: Endocrinology

## 2017-03-15 NOTE — Telephone Encounter (Signed)
Prescription that was sent into pharmacy was $197.00 which Novalog was $92.00.  Pharmacist said to call Dr. and ask about getting a prescription for Novalog. Please call patient at ph# [phone] to advise

## 2017-03-23 ENCOUNTER — Encounter: Payer: Self-pay | Admitting: Endocrinology

## 2017-03-23 ENCOUNTER — Ambulatory Visit (INDEPENDENT_AMBULATORY_CARE_PROVIDER_SITE_OTHER): Payer: Medicare Other | Admitting: Endocrinology

## 2017-03-23 VITALS — BP 122/72 | HR 113 | Ht 68.0 in | Wt 211.4 lb

## 2017-03-23 DIAGNOSIS — E1165 Type 2 diabetes mellitus with hyperglycemia: Secondary | ICD-10-CM | POA: Diagnosis not present

## 2017-03-23 DIAGNOSIS — E782 Mixed hyperlipidemia: Secondary | ICD-10-CM | POA: Diagnosis not present

## 2017-03-23 DIAGNOSIS — Z794 Long term (current) use of insulin: Secondary | ICD-10-CM | POA: Diagnosis not present

## 2017-03-23 LAB — GLUCOSE, POCT (MANUAL RESULT ENTRY): POC GLUCOSE: 360 mg/dL — AB (ref 70–99)

## 2017-03-23 LAB — POCT GLYCOSYLATED HEMOGLOBIN (HGB A1C): Hemoglobin A1C: 14.7

## 2017-03-23 MED ORDER — INSULIN NPH ISOPHANE & REGULAR (70-30) 100 UNIT/ML ~~LOC~~ SUSP: SUBCUTANEOUS | 11 refills | Status: DC

## 2017-03-23 NOTE — Patient Instructions (Addendum)
Call to schedule with Dustin Estes Phone: [phone], bring meter with you   Take 35 units in am and 25 at dinner  If sugar stays over 200 go up 5 more units  Omega 3: 2 pills 2x daily  Take Gemfibrozil daily 2x

## 2017-03-23 NOTE — Progress Notes (Signed)
Patient ID: Dustin Estes., male   DOB: 11/04/1950, 66 y.o.   MRN: [id number]          Reason for Appointment: Follow-up for Type 2 Diabetes  Referring physician: Tollie Pizza   History of Present Illness:          Date of diagnosis of type 2 diabetes mellitus:  1990s      Background history:   Patient has little recall about his medication history and has been on various medications over the years for his diabetes Has mostly taken metformin long-term and no injectable drugs His blood sugars have been more significantly out of control in the last year or so with A1c consistently over 9% since 11/17 Not clear if his medications have been changed last year  Recent history:   INSULIN regimen: NovoLog mix 25 units 15 minutes before breakfast and 25 before supper  Oral hypoglycemic drugs: Metformin 1000 mg, half tablet twice a day  His A1c is still over 14%  Current management, blood sugar patterns and problems identified:  He could not afford his NovoLog mix insulin over the last 4 weeks and started back only 2 days ago  Also he is taking 25 units in the morning instead of 35 as prescribed  He was told to get the [employer] brand Novolin 70/30 but he did not understand this and did not try this also  He says because he did not have his insulin he has not checked his blood sugar and he is still having difficulty with getting a reading  Referral was sent to have the appointment scheduled with nurse educator but this did not go through  He thinks he is trying to eat low fat foods, generally going to cafeterias  Has lost some weight  His metformin is still 500 mg twice a day, limited by his renal function, last creatinine 1.59        Side effects from medications have been: None  Compliance with the medical regimen: Poor  Glucose monitoring:  not done recently       Glucometer:  ?  Contour  Sugar in the office 360, fasting but with having insulin this morning  Self-care: The  diet that the patient has been following is none  Dietician visit, most recent: 20 years ago  CDE visit: 7/18, 8/18               Exercise:  minimal  Weight history:  Wt Readings from Last 3 Encounters:  03/23/17 211 lb 6.4 oz (95.9 kg)  02/17/17 218 lb 9.6 oz (99.2 kg)  12/13/16 220 lb 9.6 oz (100.1 kg)    Glycemic control:   Lab Results  Component Value Date   HGBA1C 14.1 12/13/2016   HGBA1C 11.5 08/22/2016   HGBA1C 5.7 (H) 05/21/2015   Lab Results  Component Value Date   MICROALBUR 2.1 (H) 11/17/2016   CREATININE 1.57 (H) 02/17/2017   Lab Results  Component Value Date   MICRALBCREAT 4.4 11/17/2016    Lab Results  Component Value Date   FRUCTOSAMINE 556 (H) 02/17/2017   FRUCTOSAMINE 525 (H) 11/10/2016    Microalbumin/creatinine ratio was 76 in 2016   Allergies as of 03/23/2017   No Known Allergies     Medication List        Accurate as of 03/23/17 10:36 AM. Always use your most recent med list.          amLODipine 5 MG tablet Commonly known as:  NORVASC Take  5 mg by mouth daily.   atorvastatin 40 MG tablet Commonly known as:  LIPITOR Take 40 mg by mouth daily.   vitamin B-12 500 MCG tablet Commonly known as:  CYANOCOBALAMIN Take 500 mcg by mouth daily.   cyanocobalamin 500 MCG tablet Take 1 tablet (500 mcg total) by mouth daily.   Flaxseed (Linseed) 1000 MG Caps Take 1 capsule by mouth 2 (two) times daily.   gemfibrozil 600 MG tablet Commonly known as:  LOPID Take 600 mg by mouth 2 (two) times daily before a meal.   GLIPIZIDE XL 10 MG 24 hr tablet Generic drug:  glipiZIDE TAKE ONE TABLET BY MOUTH EVERY DAY WITH BREAKFAST.   glucose blood test strip Commonly known as:  ACCU-CHEK GUIDE Use to check blood sugar 3 times daily   insulin aspart protamine- aspart (70-30) 100 UNIT/ML injection Commonly known as:  NOVOLOG MIX 70/30 USE 35 UNITS 15 MINUTES BEFORE BREAKFAST AND 25 UNITS BEFORE SUPPER   Insulin Lispro Prot & Lispro (75-25)  100 UNIT/ML Kwikpen Commonly known as:  HUMALOG MIX 75/25 KWIKPEN Inject 35 units before breakfast and 25 units before dinner   Insulin Pen Needle 31G X 5 MM Misc Use to inject insulin 2 times daily   INSULIN SYRINGE .5CC/30GX5/16" 30G X 5/16" 0.5 ML Misc Use to inject insulin twice daily   metFORMIN 1000 MG tablet Commonly known as:  GLUCOPHAGE   metoprolol tartrate 25 MG tablet Commonly known as:  LOPRESSOR Take 0.5 tablets (12.5 mg total) by mouth 2 (two) times daily.   multivitamin with minerals Tabs tablet Take 1 tablet by mouth at bedtime. Centrum Silver   omega-3 acid ethyl esters 1 g capsule Commonly known as:  LOVAZA Take by mouth 2 (two) times daily.   pantoprazole 40 MG tablet Commonly known as:  PROTONIX Take 1 tablet (40 mg total) by mouth 2 (two) times daily.       Allergies: No Known Allergies  Past Medical History:  Diagnosis Date  . 2nd degree AV block 04/29/2011   Medtronic Adapta  . AAA (abdominal aortic aneurysm) ([place]) 12/10/2012  . Abdominal aortic aneurysm ([facility])    4.1 x 4.3 cm  . Diabetes mellitus   . Dysrhythmia   . Encephalomalacia on imaging study   . Hypertension   . Obesity   . OSA (obstructive sleep apnea)   . PAD (peripheral artery disease) ([place])   . Paroxysmal atrial fibrillation ([place]) 03/19/2014   Asymptomatic, pacemaker detected   . Second degree AV block, Mobitz type II 03/19/2014  . SVT (supraventricular tachycardia) ([place]) 04/30/2011  . Tobacco abuse     Past Surgical History:  Procedure Laterality Date  . APPENDECTOMY  12/69  . BLADDER SURGERY  9/04,11/05,9/07   tumor removal  . BRAIN SURGERY    . COLONOSCOPY N/A 09/07/2015   Procedure: COLONOSCOPY;  Surgeon: Danie Binder, MD;  [facility];  Service: Endoscopy;  Laterality: N/A;  2:00 - moved to 1:45 - office to notify  . ESOPHAGOGASTRODUODENOSCOPY N/A 05/22/2015   GBT:DVVOH duodenal ulcer/single ulcer in the gastris antrum/erosive gastritis  .  ESOPHAGOGASTRODUODENOSCOPY N/A 09/07/2015   Procedure: ESOPHAGOGASTRODUODENOSCOPY (EGD);  Surgeon: Danie Binder, MD;  [facility];  Service: Endoscopy;  Laterality: N/A;  . LOOP RECORDER EXPLANT  04/29/2011   Procedure: LOOP RECORDER EXPLANT;  Surgeon: Sanda Klein, MD;  Location: [facility];  Service: Cardiovascular;;  . NM MYOCAR PERF WALL MOTION  04/13/2010   moderate, mostly fixed  inferoseptal  defect, suspicious for artifact.  Marland Kitchen PERMANENT PACEMAKER INSERTION  04/29/2011   Medtronic Adapta  . PERMANENT PACEMAKER INSERTION N/A 04/29/2011   Procedure: PERMANENT PACEMAKER INSERTION;  Surgeon: Sanda Klein, MD;  Location: [facility];  Service: Cardiovascular;  Laterality: N/A;  . US ECHOCARDIOGRAPHY  04/13/2010   mild asymmetric LVH, EF >55%,mild mitral ca+,AOV mildly sclerotic    Family History  Problem Relation Age of Onset  . Diabetes Mother   . Diabetes Father   . Stroke Father   . Colon cancer Neg Hx     Social History:  reports that he has been smoking cigarettes.  He has a 94.00 pack-year smoking history. he has never used smokeless tobacco. He reports that he drinks alcohol. He reports that he does not use drugs.   Review of Systems   Lipid history: Has high triglycerides as done by PCP, more recently 1700 He is not sure if he is taking all his medications Probably taking his medications irregularly and he thinks he was told by another doctor not to take gemfibrozil He has Lovaza at home and may be taking this only 2 capsules a day Not clear if he is taking Lipitor    Lab Results  Component Value Date   CHOL 186 02/17/2017   HDL 22.60 (L) 02/17/2017   LDLDIRECT 37.0 02/17/2017   TRIG (H) 02/17/2017    1197.0 Triglyceride is over 400; calculations on Lipids are invalid.   CHOLHDL 8 02/17/2017           Hypertension: Mild and treated with quinapril and amlodipine for several years, followed by other physicians  RENAL dysfunction:Followed by  nephrology  Lab Results  Component Value Date   CREATININE 1.57 (H) 02/17/2017   CREATININE 1.42 (H) 12/07/2016   CREATININE 2.77 (H) 11/10/2016     Most recent eye exam was 6/18, reportedly no diabetic retinopathy, has history of cataract surgery  Most recent foot exam: 6/18    LABS:  No visits with results within 1 Week(s) from this visit.  Latest known visit with results is:  Office Visit on 02/17/2017  Component Date Value Ref Range Status  . Sodium 02/17/2017 136  135 - 145 mEq/L Final  . Potassium 02/17/2017 4.4  3.5 - 5.1 mEq/L Final  . Chloride 02/17/2017 98  96 - 112 mEq/L Final  . CO2 02/17/2017 29  19 - 32 mEq/L Final  . Glucose, Bld 02/17/2017 273* 70 - 99 mg/dL Final  . BUN 02/17/2017 31* 6 - 23 mg/dL Final  . Creatinine, Ser 02/17/2017 1.57* 0.40 - 1.50 mg/dL Final  . Calcium 02/17/2017 9.4  8.4 - 10.5 mg/dL Final  . GFR 02/17/2017 47.02* >60.00 mL/min Final  . Fructosamine 02/17/2017 556* 0 - 285 umol/L Final   Comment: Published reference interval for apparently healthy subjects between age 61 and 88 is 38 - 285 umol/L and in a poorly controlled diabetic population is 228 - 563 umol/L with a mean of 396 umol/L.   Marland Kitchen Cholesterol 02/17/2017 186  0 - 200 mg/dL Final   ATP III Classification       Desirable:  < 200 mg/dL               Borderline High:  200 - 239 mg/dL          High:  > = 240 mg/dL  . Triglycerides 02/17/2017 1197.0 Triglyceride is over 400; calculations on Lipids are invalid.* 0.0 - 149.0 mg/dL Final   Normal:  <150 mg/dLBorderline  High:  150 - 199 mg/dL  . HDL 02/17/2017 22.60* >39.00 mg/dL Final  . Total CHOL/HDL Ratio 02/17/2017 8   Final                  Men          Women1/2 Average Risk     3.4          3.3Average Risk          5.0          4.42X Average Risk          9.6          7.13X Average Risk          15.0          11.0                      . POC Glucose 02/18/2017 249* 70 - 99 mg/dl Final  . Direct LDL 02/17/2017 37.0  mg/dL  Final   Optimal:  <100 mg/dLNear or Above Optimal:  100-129 mg/dLBorderline High:  130-159 mg/dLHigh:  160-189 mg/dLVery High:  >190 mg/dL    Physical Examination:  BP 122/72   Pulse (!) 113   Ht 5\' 8"  (1.727 m)   Wt 211 lb 6.4 oz (95.9 kg)   SpO2 96%   BMI 32.14 kg/m         ASSESSMENT:  Diabetes type 2, uncontrolled  See history of present illness for  discussion of current diabetes management, blood sugar patterns and problems identified  His blood sugars are continuing to be poorly controlled He has difficulty affording his insulin even though he was advised to try the [employer] brand which he has not done A1c has stayed over 14% from inadequate insulin Also has not done any monitoring Difficult to get his compliance to improve because of various issues and he has not followed up with nurse educator as directed also Also did not increase his morning insulin doses as directed  HYPERTENSION: Blood pressure is  Normal  LIPIDS: Last month his triglycerides were higher probably because of poor blood sugar control and he continues to be irregular with his medications  RENAL dysfunction: Stable with creatinine around 1.5  PLAN:    Increase insulin to 35 units in the morning  Discussed checking blood sugar at least twice a day at various times by rotation  He will also increase the dose by 5 units more if fasting or suppertime readings are over 200  Continue healthy balanced meals and reminded him to avoid high carbohydrate high fat meals or snacks  Written prescription given for the [employer] brand insulin  Discussed blood sugar targets at various times   He will call to schedule appointment with nurse educator, he will need to be instructed again on glucose monitoring also review his injection technique  Continue metformin 500 mg twice a day  He needs to make sure he takes his gemfibrozil, Lipitor regularly and also make sure he is getting 4 capsules daily of the  Lovaza  Follow-up in 4 weeks   There are no Patient Instructions on file for this visit.  Counseling time on subjects discussed in assessment and plan sections is over 50% of today's 25 minute visit    Lorna Strother 03/23/2017, 10:36 AM   Note: This office note was prepared with Estate agent. Any transcriptional errors that result from this process are unintentional.

## 2017-04-04 ENCOUNTER — Telehealth: Payer: Self-pay | Admitting: Cardiovascular Disease

## 2017-04-04 NOTE — Telephone Encounter (Signed)
Direction(NUC): NPO AFTER MIDNIGHT, THEY CAN TAKE ALL MEDS, EXPECT ANY DIURETICS.  Pt notified.  LM2CB for Jennifer(daughter) as directed DPR

## 2017-04-04 NOTE — Telephone Encounter (Signed)
New message  Pt verbalized that he is calling for the rn  To go over his test on 04/06/2017 and to see what medication and what food he should or should not take

## 2017-04-05 NOTE — Telephone Encounter (Signed)
Left message for pt dtr to call  

## 2017-04-06 ENCOUNTER — Ambulatory Visit (HOSPITAL_COMMUNITY): Admission: RE | Admit: 2017-04-06 | Payer: Medicare Other | Source: Ambulatory Visit

## 2017-04-07 NOTE — Telephone Encounter (Signed)
Pt rescheduled AAA duplex to 04-10-17

## 2017-04-10 ENCOUNTER — Ambulatory Visit (HOSPITAL_COMMUNITY)
Admission: RE | Admit: 2017-04-10 | Discharge: 2017-04-10 | Disposition: A | Discharge status: Home / Self Care | Payer: Medicare Other | Source: Ambulatory Visit | Attending: Cardiovascular Disease | Admitting: Cardiovascular Disease

## 2017-04-10 DIAGNOSIS — I714 Abdominal aortic aneurysm, without rupture, unspecified: Secondary | ICD-10-CM

## 2017-04-14 ENCOUNTER — Other Ambulatory Visit: Payer: Self-pay | Admitting: *Deleted

## 2017-04-14 DIAGNOSIS — I714 Abdominal aortic aneurysm, without rupture, unspecified: Secondary | ICD-10-CM

## 2017-04-20 ENCOUNTER — Encounter: Payer: Self-pay | Admitting: Endocrinology

## 2017-04-20 ENCOUNTER — Ambulatory Visit (INDEPENDENT_AMBULATORY_CARE_PROVIDER_SITE_OTHER): Payer: Medicare Other | Admitting: Endocrinology

## 2017-04-20 VITALS — BP 122/76 | HR 97 | Ht 68.0 in | Wt 213.0 lb

## 2017-04-20 DIAGNOSIS — Z794 Long term (current) use of insulin: Secondary | ICD-10-CM

## 2017-04-20 DIAGNOSIS — E1165 Type 2 diabetes mellitus with hyperglycemia: Secondary | ICD-10-CM

## 2017-04-20 LAB — LIPID PANEL
CHOL/HDL RATIO: 11
Cholesterol: 225 mg/dL — ABNORMAL HIGH (ref 0–200)
HDL: 20.6 mg/dL — AB (ref >39.00)
Triglycerides: 650 mg/dL — ABNORMAL HIGH (ref 0.0–149.0)

## 2017-04-20 LAB — BASIC METABOLIC PANEL
BUN: 42 mg/dL — ABNORMAL HIGH (ref 6–23)
CHLORIDE: 100 meq/L (ref 96–112)
CO2: 27 meq/L (ref 19–32)
CREATININE: 1.79 mg/dL — AB (ref 0.40–1.50)
Calcium: 9.6 mg/dL (ref 8.4–10.5)
GFR: 40.4 mL/min — ABNORMAL LOW (ref >60.00)
Glucose, Bld: 220 mg/dL — ABNORMAL HIGH (ref 70–99)
POTASSIUM: 5.5 meq/L — AB (ref 3.5–5.1)
SODIUM: 135 meq/L (ref 135–145)

## 2017-04-20 LAB — LDL CHOLESTEROL, DIRECT: LDL DIRECT: 120 mg/dL

## 2017-04-20 LAB — GLUCOSE, POCT (MANUAL RESULT ENTRY): POC GLUCOSE: 217 mg/dL — AB (ref 70–99)

## 2017-04-20 MED ORDER — OMEGA-3-ACID ETHYL ESTERS 1 G PO CAPS: 2.0000 g | ORAL_CAPSULE | Freq: Two times a day (BID) | ORAL | 2 refills | Status: DC

## 2017-04-20 MED ORDER — INSULIN NPH ISOPHANE & REGULAR (70-30) 100 UNIT/ML ~~LOC~~ SUSP: SUBCUTANEOUS | 11 refills | Status: AC

## 2017-04-20 NOTE — Progress Notes (Signed)
Patient ID: Dustin Morgans., male   DOB: 07/08/1950, 67 y.o.   MRN: [id number]          Reason for Appointment: Follow-up for Type 2 Diabetes  Referring physician: Tollie Pizza   History of Present Illness:          Date of diagnosis of type 2 diabetes mellitus:  1990s      Background history:   Patient has little recall about his medication history and has been on various medications over the years for his diabetes Has mostly taken metformin long-term and no injectable drugs His blood sugars have been more significantly out of control in the last year or so with A1c consistently over 9% since 11/17 Not clear if his medications have been changed last year  Recent history:   INSULIN regimen: NovoLog mix 35 units before breakfast and 35 before supper  Oral hypoglycemic drugs: Metformin 1000 mg, half tablet twice a day  His A1c is still over 14% in December  Current management, blood sugar patterns and problems identified:  He has not checked his blood sugar despite reminding him to do so and he did not call to make an appointment with the nurse educator for instructions  Today glucose is 217 fasting  He did however increase his insulin to 35 units twice a day as directed and he thinks he is taking this twice a day before meals  Because of the cost he was told to switch to the [employer] brand insulin but even though he took a written prescription for this she was told it was $1600 and he is still using NovoLog mix  His metformin is still 500 mg twice a day, limited by his renal function, last creatinine 1.57        Side effects from medications have been: None  Compliance with the medical regimen: Poor  Glucose monitoring:  not done recently       Glucometer:  ?  Contour   Self-care: The diet that the patient has been following is none  Dietician visit, most recent: 20 years ago  CDE visit: 7/18, 8/18               Exercise:  minimal  Weight history:  Wt Readings from  Last 3 Encounters:  04/20/17 213 lb (96.6 kg)  03/23/17 211 lb 6.4 oz (95.9 kg)  02/17/17 218 lb 9.6 oz (99.2 kg)    Glycemic control:   Lab Results  Component Value Date   HGBA1C 14.7 03/23/2017   HGBA1C 14.1 12/13/2016   HGBA1C 11.5 08/22/2016   Lab Results  Component Value Date   MICROALBUR 2.1 (H) 11/17/2016   CREATININE 1.57 (H) 02/17/2017   Lab Results  Component Value Date   MICRALBCREAT 4.4 11/17/2016    Lab Results  Component Value Date   FRUCTOSAMINE 556 (H) 02/17/2017   FRUCTOSAMINE 525 (H) 11/10/2016    Microalbumin/creatinine ratio was 76 in 2016   Allergies as of 04/20/2017   No Known Allergies     Medication List        Accurate as of 04/20/17 11:30 AM. Always use your most recent med list.          amLODipine 5 MG tablet Commonly known as:  NORVASC Take 5 mg by mouth daily.   atorvastatin 40 MG tablet Commonly known as:  LIPITOR Take 40 mg by mouth daily.   vitamin B-12 500 MCG tablet Commonly known as:  CYANOCOBALAMIN Take 500 mcg by mouth  daily.   cyanocobalamin 500 MCG tablet Take 1 tablet (500 mcg total) by mouth daily.   Flaxseed (Linseed) 1000 MG Caps Take 1 capsule by mouth 2 (two) times daily.   gemfibrozil 600 MG tablet Commonly known as:  LOPID Take 600 mg by mouth 2 (two) times daily before a meal.   GLIPIZIDE XL 10 MG 24 hr tablet Generic drug:  glipiZIDE TAKE ONE TABLET BY MOUTH EVERY DAY WITH BREAKFAST.   glucose blood test strip Commonly known as:  ACCU-CHEK GUIDE Use to check blood sugar 3 times daily   insulin aspart protamine- aspart (70-30) 100 UNIT/ML injection Commonly known as:  NOVOLOG MIX 70/30 USE 35 UNITS 15 MINUTES BEFORE BREAKFAST AND 25 UNITS BEFORE SUPPER   insulin NPH-regular Human (70-30) 100 UNIT/ML injection Commonly known as:  NOVOLIN 70/30 RELION 35 units in am and 25 at supper   Insulin Pen Needle 31G X 5 MM Misc Use to inject insulin 2 times daily   INSULIN SYRINGE .5CC/30GX5/16"  30G X 5/16" 0.5 ML Misc Use to inject insulin twice daily   metFORMIN 1000 MG tablet Commonly known as:  GLUCOPHAGE   metoprolol tartrate 25 MG tablet Commonly known as:  LOPRESSOR Take 0.5 tablets (12.5 mg total) by mouth 2 (two) times daily.   multivitamin with minerals Tabs tablet Take 1 tablet by mouth at bedtime. Centrum Silver   omega-3 acid ethyl esters 1 g capsule Commonly known as:  LOVAZA Take 2 capsules (2 g total) by mouth 2 (two) times daily.   pantoprazole 40 MG tablet Commonly known as:  PROTONIX Take 1 tablet (40 mg total) by mouth 2 (two) times daily.       Allergies: No Known Allergies  Past Medical History:  Diagnosis Date  . 2nd degree AV block 04/29/2011   Medtronic Adapta  . AAA (abdominal aortic aneurysm) ([place]) 12/10/2012  . Abdominal aortic aneurysm ([facility])    4.1 x 4.3 cm  . Diabetes mellitus   . Dysrhythmia   . Encephalomalacia on imaging study   . Hypertension   . Obesity   . OSA (obstructive sleep apnea)   . PAD (peripheral artery disease) ([place])   . Paroxysmal atrial fibrillation ([place]) 03/19/2014   Asymptomatic, pacemaker detected   . Second degree AV block, Mobitz type II 03/19/2014  . SVT (supraventricular tachycardia) ([place]) 04/30/2011  . Tobacco abuse     Past Surgical History:  Procedure Laterality Date  . APPENDECTOMY  12/69  . BLADDER SURGERY  9/04,11/05,9/07   tumor removal  . BRAIN SURGERY    . COLONOSCOPY N/A 09/07/2015   Procedure: COLONOSCOPY;  Surgeon: Danie Binder, MD;  [facility];  Service: Endoscopy;  Laterality: N/A;  2:00 - moved to 1:45 - office to notify  . ESOPHAGOGASTRODUODENOSCOPY N/A 05/22/2015   IHK:[id number] duodenal ulcer/single ulcer in the gastris antrum/erosive gastritis  . ESOPHAGOGASTRODUODENOSCOPY N/A 09/07/2015   Procedure: ESOPHAGOGASTRODUODENOSCOPY (EGD);  Surgeon: Danie Binder, MD;  [facility];  Service: Endoscopy;  Laterality: N/A;  . LOOP RECORDER EXPLANT  04/29/2011    Procedure: LOOP RECORDER EXPLANT;  Surgeon: Sanda Klein, MD;  Location: [facility];  Service: Cardiovascular;;  . NM MYOCAR PERF WALL MOTION  04/13/2010   moderate, mostly fixed  inferoseptal defect, suspicious for artifact.  Marland Kitchen PERMANENT PACEMAKER INSERTION  04/29/2011   Medtronic Adapta  . PERMANENT PACEMAKER INSERTION N/A 04/29/2011   Procedure: PERMANENT PACEMAKER INSERTION;  Surgeon: Sanda Klein, MD;  Location: [facility];  Service: Cardiovascular;  Laterality: N/A;  . US ECHOCARDIOGRAPHY  04/13/2010   mild asymmetric LVH, EF >55%,mild mitral ca+,AOV mildly sclerotic    Family History  Problem Relation Age of Onset  . Diabetes Mother   . Diabetes Father   . Stroke Father   . Colon cancer Neg Hx     Social History:  reports that he has been smoking cigarettes.  He has a 94.00 pack-year smoking history. he has never used smokeless tobacco. He reports that he drinks alcohol. He reports that he does not use drugs.   Review of Systems   Lipid history: Has high triglycerides as done by PCP, more recently 1200  Probably taking his gemfibrozil recently but not clear if he is taking Lipitor  He has Lovaza at home and is still taking only 2 capsules a day     Lab Results  Component Value Date   CHOL 186 02/17/2017   HDL 22.60 (L) 02/17/2017   LDLDIRECT 37.0 02/17/2017   TRIG (H) 02/17/2017    1197.0 Triglyceride is over 400; calculations on Lipids are invalid.   CHOLHDL 8 02/17/2017           Hypertension: Mild and treated with quinapril and amlodipine for several years, followed by other physicians  RENAL dysfunction:Followed by nephrology, no records available  Lab Results  Component Value Date   CREATININE 1.57 (H) 02/17/2017   CREATININE 1.42 (H) 12/07/2016   CREATININE 2.77 (H) 11/10/2016     Most recent eye exam was 6/18, reportedly no diabetic retinopathy, has history of cataract surgery  Most recent foot exam: 6/18    LABS:  No visits with  results within 1 Week(s) from this visit.  Latest known visit with results is:  Office Visit on 03/23/2017  Component Date Value Ref Range Status  . POC Glucose 03/23/2017 360* 70 - 99 mg/dl Final  . Hemoglobin A1C 03/23/2017 14.7   Final    Physical Examination:  BP 122/76   Pulse 97   Ht 5\' 8"  (1.727 m)   Wt 213 lb (96.6 kg)   SpO2 98%   BMI 32.39 kg/m         ASSESSMENT:  Diabetes type 2, uncontrolled  See history of present illness for  discussion of current diabetes management, blood sugar patterns and problems identified  His blood sugars are continuing to be not well controlled Without any home glucose monitoring difficult to know what his blood sugar patterns are Also since his fasting glucose is high probably needs increased evening insulin dose but cannot change the dose without knowing what his bedtime readings are with his premixed insulin He cannot afford separate insulin regimen with NovoLog and brand name basal insulin   HYPERTENSION: Blood pressure is  controlled and he will continue the same dose and also follow-up with PCP  LIPIDS: Needs follow-up lipids, also he still has not increased the Lovaza to the therapeutic dose of 4 capsules daily  RENAL dysfunction: Needs to be rechecked  PLAN:    To be instructed by nurse educator on home glucose monitoring  No change in insulin unless we have blood sugar records from home at various times  Discussed blood sugar targets fasting and postprandial  He will have labs done today  He will increase Lovaza to 2 capsules twice a day as directed, new prescription sent  He was given another written prescription to get the [employer] brand insulin   There are no Patient Instructions on file for this  visit.    Elayne Snare 04/20/2017, 11:30 AM   Note: This office note was prepared with Dragon voice recognition system technology. Any transcriptional errors that result from this process are unintentional.

## 2017-04-21 ENCOUNTER — Other Ambulatory Visit: Payer: Self-pay

## 2017-04-21 LAB — FRUCTOSAMINE: Fructosamine: 437 umol/L — ABNORMAL HIGH (ref 0–285)

## 2017-04-24 ENCOUNTER — Ambulatory Visit: Payer: Self-pay | Admitting: Nutrition

## 2017-05-18 ENCOUNTER — Other Ambulatory Visit: Payer: Self-pay | Admitting: Endocrinology

## 2017-06-01 ENCOUNTER — Telehealth: Payer: Self-pay | Admitting: Endocrinology

## 2017-06-01 ENCOUNTER — Ambulatory Visit (INDEPENDENT_AMBULATORY_CARE_PROVIDER_SITE_OTHER): Payer: Medicare Other | Admitting: Endocrinology

## 2017-06-01 ENCOUNTER — Encounter: Payer: Self-pay | Admitting: Endocrinology

## 2017-06-01 VITALS — BP 120/70 | HR 52 | Ht 68.0 in | Wt 216.2 lb

## 2017-06-01 DIAGNOSIS — E1165 Type 2 diabetes mellitus with hyperglycemia: Secondary | ICD-10-CM | POA: Diagnosis not present

## 2017-06-01 DIAGNOSIS — Z794 Long term (current) use of insulin: Secondary | ICD-10-CM

## 2017-06-01 LAB — COMPREHENSIVE METABOLIC PANEL
ALBUMIN: 4.1 g/dL (ref 3.5–5.2)
ALK PHOS: 93 U/L (ref 39–117)
ALT: 23 U/L (ref 0–53)
AST: 31 U/L (ref 0–37)
BILIRUBIN TOTAL: 0.3 mg/dL (ref 0.2–1.2)
BUN: 35 mg/dL — ABNORMAL HIGH (ref 6–23)
CO2: 31 mEq/L (ref 19–32)
CREATININE: 1.43 mg/dL (ref 0.40–1.50)
Calcium: 9.6 mg/dL (ref 8.4–10.5)
Chloride: 98 mEq/L (ref 96–112)
GFR: 52.33 mL/min — ABNORMAL LOW (ref >60.00)
Glucose, Bld: 269 mg/dL — ABNORMAL HIGH (ref 70–99)
Potassium: 5.1 mEq/L (ref 3.5–5.1)
Sodium: 135 mEq/L (ref 135–145)
TOTAL PROTEIN: 8.2 g/dL (ref 6.0–8.3)

## 2017-06-01 LAB — GLUCOSE, POCT (MANUAL RESULT ENTRY): POC Glucose: 259 mg/dl — AB (ref 70–99)

## 2017-06-01 NOTE — Patient Instructions (Signed)
No fried food  MORE SUGAR TESTAS AT BEDTIME  INSULIN 55 IN AM AND 50 AT SUPPER

## 2017-06-01 NOTE — Progress Notes (Signed)
Patient ID: Dustin Estes., male   DOB: February 02, 1951, 67 y.o.   MRN: [id number]          Reason for Appointment: Follow-up for Type 2 Diabetes  Referring physician: Tollie Pizza   History of Present Illness:          Date of diagnosis of type 2 diabetes mellitus:  1990s      Background history:   Patient has little recall about his medication history and has been on various medications over the years for his diabetes Has mostly taken metformin long-term and no injectable drugs His blood sugars have been more significantly out of control in the last year or so with A1c consistently over 9% since 11/17 Not clear if his medications have been changed last year  Recent history:   INSULIN regimen: Novolin mix 40 units before breakfast and 40 before supper  Oral hypoglycemic drugs: Metformin 1000 mg, half tablet twice a day  His A1c is still over 14% in December  Current management, blood sugar patterns and problems identified:  He has not brought his blood sugar monitor despite reminding him to do so   However he has finally been able to check his blood sugar at home and had no difficulty doing this  However he thinks he is checking blood sugar only when he feels like it and only when he is ready to take his insulin either the morning or evening  He does not know what his specific blood sugars are and he thinks there is still about 250 most of the time  He took his insulin last evening at 6 PM and has not had any food or insulin this morning, blood sugar is 259  However he did eat fried chicken last night  He is able to get his insulin without difficulty from [facility] now  His metformin is still 500 mg twice a day, limited by his renal function  He has not had a consultation with dietitian and does not think she is consistent with his diet        Side effects from medications have been: None  Compliance with the medical regimen: Improving  Glucose monitoring:  done recently        Glucometer:  ?  Contour   Self-care: The diet that the patient has been following is none  Dietician visit, most recent: 20 years ago  CDE visit: 7/18, 8/18               Exercise:  minimal  Weight history:  Wt Readings from Last 3 Encounters:  06/01/17 216 lb 3.2 oz (98.1 kg)  04/20/17 213 lb (96.6 kg)  03/23/17 211 lb 6.4 oz (95.9 kg)    Glycemic control:   Lab Results  Component Value Date   HGBA1C 14.7 03/23/2017   HGBA1C 14.1 12/13/2016   HGBA1C 11.5 08/22/2016   Lab Results  Component Value Date   MICROALBUR 2.1 (H) 11/17/2016   CREATININE 1.79 (H) 04/20/2017   Lab Results  Component Value Date   MICRALBCREAT 4.4 11/17/2016    Lab Results  Component Value Date   FRUCTOSAMINE 437 (H) 04/20/2017   FRUCTOSAMINE 556 (H) 02/17/2017   FRUCTOSAMINE 525 (H) 11/10/2016    Microalbumin/creatinine ratio was 76 in 2016   Allergies as of 06/01/2017   No Known Allergies     Medication List        Accurate as of 06/01/17  1:19 PM. Always use your most recent med list.  amLODipine 5 MG tablet Commonly known as:  NORVASC Take 5 mg by mouth daily.   atorvastatin 40 MG tablet Commonly known as:  LIPITOR Take 40 mg by mouth daily.   vitamin B-12 500 MCG tablet Commonly known as:  CYANOCOBALAMIN Take 500 mcg by mouth daily.   cyanocobalamin 500 MCG tablet Take 1 tablet (500 mcg total) by mouth daily.   Flaxseed (Linseed) 1000 MG Caps Take 1 capsule by mouth 2 (two) times daily.   gemfibrozil 600 MG tablet Commonly known as:  LOPID Take 600 mg by mouth 2 (two) times daily before a meal.   GLIPIZIDE XL 10 MG 24 hr tablet Generic drug:  glipiZIDE TAKE ONE TABLET BY MOUTH EVERY DAY WITH BREAKFAST   glucose blood test strip Commonly known as:  ACCU-CHEK GUIDE Use to check blood sugar 3 times daily   insulin NPH-regular Human (70-30) 100 UNIT/ML injection Commonly known as:  NOVOLIN 70/30 RELION 35 units in am and 25 at supper   Insulin  Pen Needle 31G X 5 MM Misc Use to inject insulin 2 times daily   INSULIN SYRINGE .5CC/30GX5/16" 30G X 5/16" 0.5 ML Misc Use to inject insulin twice daily   metFORMIN 1000 MG tablet Commonly known as:  GLUCOPHAGE   metoprolol tartrate 25 MG tablet Commonly known as:  LOPRESSOR Take 0.5 tablets (12.5 mg total) by mouth 2 (two) times daily.   multivitamin with minerals Tabs tablet Take 1 tablet by mouth at bedtime. Centrum Silver   omega-3 acid ethyl esters 1 g capsule Commonly known as:  LOVAZA Take 2 capsules (2 g total) by mouth 2 (two) times daily.   pantoprazole 40 MG tablet Commonly known as:  PROTONIX Take 1 tablet (40 mg total) by mouth 2 (two) times daily.   quinapril 10 MG tablet Commonly known as:  ACCUPRIL Take 10 mg by mouth daily.       Allergies: No Known Allergies  Past Medical History:  Diagnosis Date  . 2nd degree AV block 04/29/2011   Medtronic Adapta  . AAA (abdominal aortic aneurysm) ([place]) 12/10/2012  . Abdominal aortic aneurysm ([facility])    4.1 x 4.3 cm  . Diabetes mellitus   . Dysrhythmia   . Encephalomalacia on imaging study   . Hypertension   . Obesity   . OSA (obstructive sleep apnea)   . PAD (peripheral artery disease) ([place])   . Paroxysmal atrial fibrillation ([place]) 03/19/2014   Asymptomatic, pacemaker detected   . Second degree AV block, Mobitz type II 03/19/2014  . SVT (supraventricular tachycardia) ([place]) 04/30/2011  . Tobacco abuse     Past Surgical History:  Procedure Laterality Date  . APPENDECTOMY  12/69  . BLADDER SURGERY  9/04,11/05,9/07   tumor removal  . BRAIN SURGERY    . COLONOSCOPY N/A 09/07/2015   Procedure: COLONOSCOPY;  Surgeon: Danie Binder, MD;  [facility];  Service: Endoscopy;  Laterality: N/A;  2:00 - moved to 1:45 - office to notify  . ESOPHAGOGASTRODUODENOSCOPY N/A 05/22/2015   NGE:XBMWU duodenal ulcer/single ulcer in the gastris antrum/erosive gastritis  . ESOPHAGOGASTRODUODENOSCOPY N/A 09/07/2015    Procedure: ESOPHAGOGASTRODUODENOSCOPY (EGD);  Surgeon: Danie Binder, MD;  [facility];  Service: Endoscopy;  Laterality: N/A;  . LOOP RECORDER EXPLANT  04/29/2011   Procedure: LOOP RECORDER EXPLANT;  Surgeon: Sanda Klein, MD;  Location: [facility];  Service: Cardiovascular;;  . NM MYOCAR PERF WALL MOTION  04/13/2010   moderate, mostly fixed  inferoseptal defect, suspicious  for artifact.  Marland Kitchen PERMANENT PACEMAKER INSERTION  04/29/2011   Medtronic Adapta  . PERMANENT PACEMAKER INSERTION N/A 04/29/2011   Procedure: PERMANENT PACEMAKER INSERTION;  Surgeon: Sanda Klein, MD;  Location: [facility];  Service: Cardiovascular;  Laterality: N/A;  . US ECHOCARDIOGRAPHY  04/13/2010   mild asymmetric LVH, EF >55%,mild mitral ca+,AOV mildly sclerotic    Family History  Problem Relation Age of Onset  . Diabetes Mother   . Diabetes Father   . Stroke Father   . Colon cancer Neg Hx     Social History:  reports that he has been smoking cigarettes.  He has a 94.00 pack-year smoking history. he has never used smokeless tobacco. He reports that he drinks alcohol. He reports that he does not use drugs.   Review of Systems   Lipid history: Has high triglycerides as done by PCP, more recently 1200  Probably taking his gemfibrozil recently but not clear if he is taking Lipitor  He has Lovaza at home and is still taking only 2 capsules a day     Lab Results  Component Value Date   CHOL 225 (H) 04/20/2017   HDL 20.60 (L) 04/20/2017   LDLDIRECT 120.0 04/20/2017   TRIG (H) 04/20/2017    650.0 Triglyceride is over 400; calculations on Lipids are invalid.   CHOLHDL 11 04/20/2017           Hypertension: Mild and treated with quinapril and amlodipine for several years, followed by other physicians  RENAL dysfunction:Followed by nephrology, no records available  Lab Results  Component Value Date   CREATININE 1.79 (H) 04/20/2017   CREATININE 1.57 (H) 02/17/2017   CREATININE 1.42 (H)  12/07/2016     Most recent eye exam was 6/18, reportedly no diabetic retinopathy, has history of cataract surgery  Most recent foot exam: 6/18    LABS:  No visits with results within 1 Week(s) from this visit.  Latest known visit with results is:  Office Visit on 04/20/2017  Component Date Value Ref Range Status  . POC Glucose 04/20/2017 217* 70 - 99 mg/dl Final  . Cholesterol 04/20/2017 225* 0 - 200 mg/dL Final   ATP III Classification       Desirable:  < 200 mg/dL               Borderline High:  200 - 239 mg/dL          High:  > = 240 mg/dL  . Triglycerides 04/20/2017 650.0 Triglyceride is over 400; calculations on Lipids are invalid.* 0.0 - 149.0 mg/dL Final   Normal:  <150 mg/dLBorderline High:  150 - 199 mg/dL  . HDL 04/20/2017 20.60* >39.00 mg/dL Final  . Total CHOL/HDL Ratio 04/20/2017 11   Final                  Men          Women1/2 Average Risk     3.4          3.3Average Risk          5.0          4.42X Average Risk          9.6          7.13X Average Risk          15.0          11.0                      .  Fructosamine 04/20/2017 437* 0 - 285 umol/L Final   Comment: Published reference interval for apparently healthy subjects between age 37 and 10 is 43 - 285 umol/L and in a poorly controlled diabetic population is 228 - 563 umol/L with a mean of 396 umol/L.   Marland Kitchen Sodium 04/20/2017 135  135 - 145 mEq/L Final  . Potassium 04/20/2017 5.5* 3.5 - 5.1 mEq/L Final  . Chloride 04/20/2017 100  96 - 112 mEq/L Final  . CO2 04/20/2017 27  19 - 32 mEq/L Final  . Glucose, Bld 04/20/2017 220* 70 - 99 mg/dL Final  . BUN 04/20/2017 42* 6 - 23 mg/dL Final  . Creatinine, Ser 04/20/2017 1.79* 0.40 - 1.50 mg/dL Final  . Calcium 04/20/2017 9.6  8.4 - 10.5 mg/dL Final  . GFR 04/20/2017 40.40* >60.00 mL/min Final  . Direct LDL 04/20/2017 120.0  mg/dL Final   Optimal:  <100 mg/dLNear or Above Optimal:  100-129 mg/dLBorderline High:  130-159 mg/dLHigh:  160-189 mg/dLVery High:  >190 mg/dL     Physical Examination:  BP 120/70 (BP Location: Left Arm, Patient Position: Sitting, Cuff Size: Normal)   Pulse (!) 52   Ht 5\' 8"  (1.727 m)   Wt 216 lb 3.2 oz (98.1 kg)   SpO2 95%   BMI 32.87 kg/m         ASSESSMENT:  Diabetes type 2, uncontrolled  See history of present illness for  discussion of current diabetes management, blood sugar patterns and problems identified  His blood sugars are continuing to be persistently high This is despite now taking 80 units of insulin a day He thinks he is compliant with his insulin and metformin but likely not watching his diet and can do better  He is reporting blood sugars around 250 at home similar to office reading today   HYPERTENSION: Blood pressure is  controlled on quinapril and Norvasc However because of the tendency to hyperkalemia will hold off his quinapril now and assess his requirement for treatment on the next visit   LIPIDS: Needs better blood sugar control and weight loss as well as improved diet as his triglycerides are high despite triple therapy   RENAL dysfunction: Needs to be rechecked  PLAN:    To be instructed by nutritionist on diet  He will increase his morning insulin to 55 and evening insulin to 50  Cut back on fried foods  Check blood sugar more consistently and some after dinner also  He will need to bring his monitor consistently for evaluation  Labs for chemistry today   There are no Patient Instructions on file for this visit.    Elayne Snare 06/01/2017, 1:19 PM   Note: This office note was prepared with Dragon voice recognition system technology. Any transcriptional errors that result from this process are unintentional.

## 2017-06-01 NOTE — Telephone Encounter (Signed)
Pt was wondering if he will be receiving the bigger syringes for his insulin now that the dosing has been increased. He states this was discussed in the visit with Dr. Dwyane Dee.  Please let us know what we are supposed to call in for the pt. Please call them into [facility] in [place]

## 2017-06-05 ENCOUNTER — Telehealth: Payer: Self-pay

## 2017-06-05 NOTE — Telephone Encounter (Signed)
-----   Message from Elayne Snare, MD sent at 06/01/2017  9:18 PM EST ----- Let him know his kidney test is better and he can take the whole tablet of metformin twice a day.  Continue to leave off quinapril

## 2017-06-05 NOTE — Telephone Encounter (Signed)
Spoke to patient. Gave results and med instructions. Pt verbalized understanding.  

## 2017-06-06 LAB — CUP PACEART INCLINIC DEVICE CHECK
Implantable Lead Implant Date: 20130111
Implantable Lead Location: 753859
Implantable Lead Location: 753860
Implantable Lead Model: 5076
Lead Channel Setting Pacing Amplitude: 1.5 V
Lead Channel Setting Pacing Amplitude: 2 V
Lead Channel Setting Pacing Pulse Width: 0.4 ms
MDC IDC LEAD IMPLANT DT: 20130111
MDC IDC PG IMPLANT DT: 20130111
MDC IDC SESS DTM: 20190219141751
MDC IDC SET LEADCHNL RV SENSING SENSITIVITY: 2.8 mV

## 2017-06-14 ENCOUNTER — Encounter: Payer: Self-pay | Admitting: Gastroenterology

## 2017-06-22 ENCOUNTER — Encounter: Payer: Self-pay | Admitting: Gastroenterology

## 2017-07-17 ENCOUNTER — Telehealth: Payer: Self-pay | Admitting: Endocrinology

## 2017-07-17 NOTE — Telephone Encounter (Signed)
Since his metFORMIN (GLUCOPHAGE) 1000 MG tablet [[id number]] dosage has been upped, patient has run out of medication, he have enough for 8 days and he will be out.  he need a refill [facility], [place] - [phone] [place]

## 2017-07-18 ENCOUNTER — Other Ambulatory Visit: Payer: Self-pay

## 2017-07-18 MED ORDER — METFORMIN HCL 1000 MG PO TABS: 1000.0000 mg | ORAL_TABLET | Freq: Two times a day (BID) | ORAL | 2 refills | Status: AC

## 2017-07-18 NOTE — Telephone Encounter (Signed)
This has been done.

## 2017-08-01 ENCOUNTER — Ambulatory Visit (INDEPENDENT_AMBULATORY_CARE_PROVIDER_SITE_OTHER): Payer: Medicare Other | Admitting: Endocrinology

## 2017-08-01 ENCOUNTER — Encounter: Payer: Self-pay | Admitting: Endocrinology

## 2017-08-01 VITALS — BP 140/82 | HR 84 | Ht 68.0 in | Wt 217.8 lb

## 2017-08-01 DIAGNOSIS — Z794 Long term (current) use of insulin: Secondary | ICD-10-CM | POA: Diagnosis not present

## 2017-08-01 DIAGNOSIS — E782 Mixed hyperlipidemia: Secondary | ICD-10-CM

## 2017-08-01 DIAGNOSIS — E1165 Type 2 diabetes mellitus with hyperglycemia: Secondary | ICD-10-CM

## 2017-08-01 LAB — LIPID PANEL
Cholesterol: 134 mg/dL (ref 0–200)
HDL: 17.4 mg/dL — ABNORMAL LOW (ref >39.00)
Total CHOL/HDL Ratio: 8

## 2017-08-01 LAB — COMPREHENSIVE METABOLIC PANEL
ALBUMIN: 3.9 g/dL (ref 3.5–5.2)
ALT: 31 U/L (ref 0–53)
AST: 48 U/L — AB (ref 0–37)
Alkaline Phosphatase: 84 U/L (ref 39–117)
BUN: 23 mg/dL (ref 6–23)
CHLORIDE: 102 meq/L (ref 96–112)
CO2: 27 mEq/L (ref 19–32)
CREATININE: 1.4 mg/dL (ref 0.40–1.50)
Calcium: 8.1 mg/dL — ABNORMAL LOW (ref 8.4–10.5)
GFR: 53.6 mL/min — ABNORMAL LOW (ref >60.00)
GLUCOSE: 261 mg/dL — AB (ref 70–99)
Potassium: 4.4 mEq/L (ref 3.5–5.1)
SODIUM: 137 meq/L (ref 135–145)
Total Bilirubin: 0.3 mg/dL (ref 0.2–1.2)
Total Protein: 7.4 g/dL (ref 6.0–8.3)

## 2017-08-01 LAB — GLUCOSE, POCT (MANUAL RESULT ENTRY): POC Glucose: 247 mg/dl — AB (ref 70–99)

## 2017-08-01 LAB — LDL CHOLESTEROL, DIRECT: Direct LDL: 59 mg/dL

## 2017-08-01 LAB — POCT GLYCOSYLATED HEMOGLOBIN (HGB A1C): Hemoglobin A1C: 10.8

## 2017-08-01 NOTE — Progress Notes (Signed)
Patient ID: Dustin Estes., male   DOB: [birth year], 67 y.o.   MRN: [id number]          Reason for Appointment: Follow-up for Type 2 Diabetes  Referring physician: Tollie Pizza   History of Present Illness:          Date of diagnosis of type 2 diabetes mellitus:  1990s      Background history:   Patient has little recall about his medication history and has been on various medications over the years for his diabetes Has mostly taken metformin long-term and no injectable drugs His blood sugars have been more significantly out of control in the last year or so with A1c consistently over 9% since 11/17 Not clear if his medications have been changed last year  Recent history:   INSULIN regimen: Novolin mix 55 units before breakfast and 50 before supper  Oral hypoglycemic drugs: Metformin 1000 mg, 1 tablet twice a day  His A1c is still over 14% in December  Current management, blood sugar patterns and problems identified:  He has not been checking   his blood sugar monitor despite reminding him to do so, he says that he is not able to use the lancet device to break his finger and has not done it  Despite his taking his insulin this morning and not eating any breakfast his blood sugar is still 264  Lites and renal function improving on his last visit his metformin was increased to maximum dose  However despite increasing his insulin doses his blood sugars appear to be still high with increased A1c  He has been avoiding fried food a little more but not consistently and still eating relatively high fat meats at times  His mealtimes are quite irregular and today he did not wake up until noon  He was told to see the diabetes educator for follow-up but he canceled his appointment  No reported hypoglycemia  He has not had a consultation with dietitian         Side effects from medications have been: None  Compliance with the medical regimen: Improving  Glucose monitoring:  Not  done     Glucometer:  One Touch Verio  Denies blood sugar on his meter was 324 in March  Self-care: The diet that the patient has been following is none  Dietician visit, most recent: 20 years ago  CDE visit: 7/18, 8/18               Exercise:  minimal  Weight history:  Wt Readings from Last 3 Encounters:  08/01/17 217 lb 12.8 oz (98.8 kg)  06/01/17 216 lb 3.2 oz (98.1 kg)  04/20/17 213 lb (96.6 kg)    Glycemic control:   Lab Results  Component Value Date   HGBA1C 10.8 08/01/2017   HGBA1C 14.7 03/23/2017   HGBA1C 14.1 12/13/2016   Lab Results  Component Value Date   MICROALBUR 2.1 (H) 11/17/2016   CREATININE 1.43 06/01/2017   Lab Results  Component Value Date   MICRALBCREAT 4.4 11/17/2016    Lab Results  Component Value Date   FRUCTOSAMINE 437 (H) 04/20/2017   FRUCTOSAMINE 556 (H) 02/17/2017   FRUCTOSAMINE 525 (H) 11/10/2016       Allergies as of 08/01/2017   No Known Allergies     Medication List        Accurate as of 08/01/17  1:25 PM. Always use your most recent med list.  amLODipine 5 MG tablet Commonly known as:  NORVASC Take 5 mg by mouth daily.   atorvastatin 40 MG tablet Commonly known as:  LIPITOR Take 40 mg by mouth daily.   vitamin B-12 500 MCG tablet Commonly known as:  CYANOCOBALAMIN Take 500 mcg by mouth daily.   cyanocobalamin 500 MCG tablet Take 1 tablet (500 mcg total) by mouth daily.   fenofibrate 160 MG tablet Take 160 mg by mouth once.   Flaxseed (Linseed) 1000 MG Caps Take 1 capsule by mouth 2 (two) times daily.   GLIPIZIDE XL 10 MG 24 hr tablet Generic drug:  glipiZIDE TAKE ONE TABLET BY MOUTH EVERY DAY WITH BREAKFAST   glucose blood test strip Commonly known as:  ACCU-CHEK GUIDE Use to check blood sugar 3 times daily   insulin NPH-regular Human (70-30) 100 UNIT/ML injection Commonly known as:  NOVOLIN 70/30 RELION 35 units in am and 25 at supper   Insulin Pen Needle 31G X 5 MM Misc Use to inject  insulin 2 times daily   INSULIN SYRINGE .5CC/30GX5/16" 30G X 5/16" 0.5 ML Misc Use to inject insulin twice daily   metFORMIN 1000 MG tablet Commonly known as:  GLUCOPHAGE Take 1 tablet (1,000 mg total) by mouth 2 (two) times daily.   metoprolol tartrate 25 MG tablet Commonly known as:  LOPRESSOR Take 0.5 tablets (12.5 mg total) by mouth 2 (two) times daily.   multivitamin with minerals Tabs tablet Take 1 tablet by mouth at bedtime. Centrum Silver   omega-3 acid ethyl esters 1 g capsule Commonly known as:  LOVAZA Take 2 capsules (2 g total) by mouth 2 (two) times daily.   pantoprazole 40 MG tablet Commonly known as:  PROTONIX Take 1 tablet (40 mg total) by mouth 2 (two) times daily.       Allergies: No Known Allergies  Past Medical History:  Diagnosis Date  . 2nd degree AV block 04/29/2011   Medtronic Adapta  . AAA (abdominal aortic aneurysm) ([place]) 12/10/2012  . Abdominal aortic aneurysm ([facility])    4.1 x 4.3 cm  . Diabetes mellitus   . Dysrhythmia   . Encephalomalacia on imaging study   . Hypertension   . Obesity   . OSA (obstructive sleep apnea)   . PAD (peripheral artery disease) ([place])   . Paroxysmal atrial fibrillation ([place]) 03/19/2014   Asymptomatic, pacemaker detected   . Second degree AV block, Mobitz type II 03/19/2014  . SVT (supraventricular tachycardia) ([place]) 04/30/2011  . Tobacco abuse     Past Surgical History:  Procedure Laterality Date  . APPENDECTOMY  12/69  . BLADDER SURGERY  9/04,11/05,9/07   tumor removal  . BRAIN SURGERY    . COLONOSCOPY N/A 09/07/2015   Procedure: COLONOSCOPY;  Surgeon: Danie Binder, MD;  [facility];  Service: Endoscopy;  Laterality: N/A;  2:00 - moved to 1:45 - office to notify  . ESOPHAGOGASTRODUODENOSCOPY N/A 05/22/2015   PJK:DTOIZ duodenal ulcer/single ulcer in the gastris antrum/erosive gastritis  . ESOPHAGOGASTRODUODENOSCOPY N/A 09/07/2015   Procedure: ESOPHAGOGASTRODUODENOSCOPY (EGD);  Surgeon: Danie Binder,  MD;  [facility];  Service: Endoscopy;  Laterality: N/A;  . LOOP RECORDER EXPLANT  04/29/2011   Procedure: LOOP RECORDER EXPLANT;  Surgeon: Sanda Klein, MD;  Location: [facility];  Service: Cardiovascular;;  . NM MYOCAR PERF WALL MOTION  04/13/2010   moderate, mostly fixed  inferoseptal defect, suspicious for artifact.  Marland Kitchen PERMANENT PACEMAKER INSERTION  04/29/2011   Medtronic Adapta  . PERMANENT  PACEMAKER INSERTION N/A 04/29/2011   Procedure: PERMANENT PACEMAKER INSERTION;  Surgeon: Sanda Klein, MD;  Location: [facility];  Service: Cardiovascular;  Laterality: N/A;  . US ECHOCARDIOGRAPHY  04/13/2010   mild asymmetric LVH, EF >55%,mild mitral ca+,AOV mildly sclerotic    Family History  Problem Relation Age of Onset  . Diabetes Mother   . Diabetes Father   . Stroke Father   . Colon cancer Neg Hx     Social History:  reports that he has been smoking cigarettes.  He has a 94.00 pack-year smoking history. He has never used smokeless tobacco. He reports that he drinks alcohol. He reports that he does not use drugs.   Review of Systems   Lipid history: Has high triglycerides persistently  Probably taking his gemfibrozil recently but now taking fenofibrate along with the Lovaza Also supposed to be on Lipitor    Lab Results  Component Value Date   CHOL 225 (H) 04/20/2017   HDL 20.60 (L) 04/20/2017   LDLDIRECT 120.0 04/20/2017   TRIG (H) 04/20/2017    650.0 Triglyceride is over 400; calculations on Lipids are invalid.   CHOLHDL 11 04/20/2017           Hypertension: Mild and treated with amlodipine for several years, followed by other physicians Quinapril was stopped when he had mild hyperkalemia  RENAL dysfunction:Followed by nephrology, no records available  Lab Results  Component Value Date   CREATININE 1.43 06/01/2017   CREATININE 1.79 (H) 04/20/2017   CREATININE 1.57 (H) 02/17/2017     Most recent eye exam was 6/18, reportedly no diabetic retinopathy,  has history of cataract surgery  Most recent foot exam: 6/18    LABS:  Office Visit on 08/01/2017  Component Date Value Ref Range Status  . Hemoglobin A1C 08/01/2017 10.8   Final  . POC Glucose 08/01/2017 247* 70 - 99 mg/dl Final    Physical Examination:  BP 140/82 (BP Location: Left Arm, Patient Position: Sitting, Cuff Size: Normal)   Pulse 84   Ht 5\' 8"  (1.727 m)   Wt 217 lb 12.8 oz (98.8 kg)   SpO2 98%   BMI 33.12 kg/m         ASSESSMENT:  Diabetes type 2, uncontrolled  See history of present illness for  discussion of current diabetes management, blood sugar patterns and problems identified  His A1c is still over 10%  His blood sugars are continuing to be persistently high but difficult to assess because of lack of home monitoring Insulin dose has been increased progressively He is having difficulty knowing how to use the lancet device  LIPIDS: Needs better blood sugar control and weight loss as well as improved diet as his triglycerides are high despite triple therapy   RENAL dysfunction: Needs to be rechecked  PLAN:    To be instructed on home monitoring by nurse educator  May also need to be seen by nutritionist on diet  He will increase his morning insulin to 65 and for now continue evening insulin to 50  Check renal function  Continue increase metformin unless renal function worse  Need to review his blood sugars on his monitor in 1 month   Patient Instructions  Increase am insulin to [place] 08/01/2017, 1:25 PM   Note: This office note was prepared with Dragon voice recognition system technology. Any transcriptional errors that result from this process are unintentional.

## 2017-08-01 NOTE — Patient Instructions (Addendum)
Increase am insulin to 65 and do 50 in pm  Check blood sugars on waking up  3/7  Also check blood sugars about 2 hours after a meal and do this after different meals by rotation  Recommended blood sugar levels on waking up is 90-130 and about 2 hours after meal is 130-160  Please bring your blood sugar monitor to each visit, thank you

## 2017-08-16 ENCOUNTER — Other Ambulatory Visit: Payer: Self-pay | Admitting: Endocrinology

## 2017-08-23 ENCOUNTER — Ambulatory Visit: Payer: Medicare Other | Admitting: Nurse Practitioner

## 2017-08-23 ENCOUNTER — Encounter: Payer: Self-pay | Admitting: Gastroenterology

## 2017-08-23 ENCOUNTER — Telehealth: Payer: Self-pay | Admitting: Gastroenterology

## 2017-08-23 NOTE — Progress Notes (Deleted)
Referring Provider: [name], MD Primary Care Physician:  [name], MD Primary GI:  Dr. Oneida Alar  No chief complaint on file.   HPI:   Dustin Estes. is a 67 y.o. male who presents for follow-up on GI bleed.  The patient was last seen in our office 07/27/2016 for upper GI bleed, acute gastric ulcer with hemorrhage, duodenal ulcer with hemorrhage.  Previous NSAID use which he ceased and substituted Tylenol.  Colonoscopy up-to-date 09/07/2015 with noted 6 polyps throughout the colon, diverticulosis, nonbleeding internal hemorrhoids.  Surgical pathology found mix of sessile serrated/adenoma and tubular adenoma.  Recommended repeat colonoscopy in 3 to 5 years.  EGD the same day found ulcer healing.  Recommended trigger avoidance, continue Protonix twice daily.  At his last visit he was doing well, still on Protonix, no upper or lower GI symptoms.  Recommended continue Protonix, follow-up in 1 year.  Today he states   Past Medical History:  Diagnosis Date  . 2nd degree AV block 04/29/2011   Medtronic Adapta  . AAA (abdominal aortic aneurysm) ([place]) 12/10/2012  . Abdominal aortic aneurysm ([facility])    4.1 x 4.3 cm  . Diabetes mellitus   . Dysrhythmia   . Encephalomalacia on imaging study   . Hypertension   . Obesity   . OSA (obstructive sleep apnea)   . PAD (peripheral artery disease) ([place])   . Paroxysmal atrial fibrillation ([place]) 03/19/2014   Asymptomatic, pacemaker detected   . Second degree AV block, Mobitz type II 03/19/2014  . SVT (supraventricular tachycardia) ([place]) 04/30/2011  . Tobacco abuse     Past Surgical History:  Procedure Laterality Date  . APPENDECTOMY  12/69  . BLADDER SURGERY  9/04,11/05,9/07   tumor removal  . BRAIN SURGERY    . COLONOSCOPY N/A 09/07/2015   Procedure: COLONOSCOPY;  Surgeon: Danie Binder, MD;  [facility];  Service: Endoscopy;  Laterality: N/A;  2:00 - moved to 1:45 - office to notify  . ESOPHAGOGASTRODUODENOSCOPY N/A  05/22/2015   XBM:WUXLK duodenal ulcer/single ulcer in the gastris antrum/erosive gastritis  . ESOPHAGOGASTRODUODENOSCOPY N/A 09/07/2015   Procedure: ESOPHAGOGASTRODUODENOSCOPY (EGD);  Surgeon: Danie Binder, MD;  [facility];  Service: Endoscopy;  Laterality: N/A;  . LOOP RECORDER EXPLANT  04/29/2011   Procedure: LOOP RECORDER EXPLANT;  Surgeon: Sanda Klein, MD;  Location: [facility];  Service: Cardiovascular;;  . NM MYOCAR PERF WALL MOTION  04/13/2010   moderate, mostly fixed  inferoseptal defect, suspicious for artifact.  Marland Kitchen PERMANENT PACEMAKER INSERTION  04/29/2011   Medtronic Adapta  . PERMANENT PACEMAKER INSERTION N/A 04/29/2011   Procedure: PERMANENT PACEMAKER INSERTION;  Surgeon: Sanda Klein, MD;  Location: [facility];  Service: Cardiovascular;  Laterality: N/A;  . US ECHOCARDIOGRAPHY  04/13/2010   mild asymmetric LVH, EF >55%,mild mitral ca+,AOV mildly sclerotic    Current Outpatient Medications  Medication Sig Dispense Refill  . amLODipine (NORVASC) 5 MG tablet Take 5 mg by mouth daily.    Marland Kitchen atorvastatin (LIPITOR) 40 MG tablet Take 40 mg by mouth daily.    . cyanocobalamin 500 MCG tablet Take 1 tablet (500 mcg total) by mouth daily. 30 tablet 2  . fenofibrate 160 MG tablet Take 160 mg by mouth once.    . Flaxseed, Linseed, 1000 MG CAPS Take 1 capsule by mouth 2 (two) times daily.     Marland Kitchen GLIPIZIDE XL 10 MG 24 hr tablet TAKE ONE TABLET BY MOUTH EVERY DAY WITH BREAKFAST  30 tablet 1  . glucose blood (ACCU-CHEK GUIDE) test strip Use to check blood sugar 3 times daily 100 each 12  . insulin NPH-regular Human (NOVOLIN 70/30 RELION) (70-30) 100 UNIT/ML injection 35 units in am and 25 at supper (Patient taking differently: 40 units in am and 40 at supper) 10 mL 11  . Insulin Pen Needle 31G X 5 MM MISC Use to inject insulin 2 times daily 90 each 4  . Insulin Syringe-Needle U-100 (INSULIN SYRINGE .5CC/30GX5/16") 30G X 5/16" 0.5 ML MISC Use to inject insulin twice daily 90 each 3   . metFORMIN (GLUCOPHAGE) 1000 MG tablet Take 1 tablet (1,000 mg total) by mouth 2 (two) times daily. 180 tablet 2  . metoprolol tartrate (LOPRESSOR) 25 MG tablet Take 0.5 tablets (12.5 mg total) by mouth 2 (two) times daily. 60 tablet 2  . Multiple Vitamin (MULITIVITAMIN WITH MINERALS) TABS Take 1 tablet by mouth at bedtime. Centrum Silver    . omega-3 acid ethyl esters (LOVAZA) 1 g capsule Take 2 capsules (2 g total) by mouth 2 (two) times daily. 120 capsule 2  . pantoprazole (PROTONIX) 40 MG tablet Take 1 tablet (40 mg total) by mouth 2 (two) times daily. 60 tablet 2  . vitamin B-12 (CYANOCOBALAMIN) 500 MCG tablet Take 500 mcg by mouth daily.     No current facility-administered medications for this visit.     Allergies as of 08/23/2017  . (No Known Allergies)    Family History  Problem Relation Age of Onset  . Diabetes Mother   . Diabetes Father   . Stroke Father   . Colon cancer Neg Hx     Social History   Socioeconomic History  . Marital status: Divorced    Spouse name: Not on file  . Number of children: 2  . Years of education: GED  . Highest education level: Not on file  Occupational History    Comment: retired, owned Sales promotion account executive, [employer]  . Financial resource strain: Not on file  . Food insecurity:    Worry: Not on file    Inability: Not on file  . Transportation needs:    Medical: Not on file    Non-medical: Not on file  Tobacco Use  . Smoking status: Current Every Day Smoker    Packs/day: 2.00    Years: 47.00    Pack years: 94.00    Types: Cigarettes  . Smokeless tobacco: Never Used  . Tobacco comment: 4 (10packs per caroton) cartons/month (01/13/14), 09/09/15 smoking 3 cartons every 2 weeks  Substance and Sexual Activity  . Alcohol use: Yes    Comment: "hardly ever"  . Drug use: No  . Sexual activity: Never  Lifestyle  . Physical activity:    Days per week: Not on file    Minutes per session: Not on file  . Stress: Not on file    Relationships  . Social connections:    Talks on phone: Not on file    Gets together: Not on file    Attends religious service: Not on file    Active member of club or organization: Not on file    Attends meetings of clubs or organizations: Not on file    Relationship status: Not on file  Other Topics Concern  . Not on file  Social History Narrative   Single, lives alone   Caffeine use- coffee 2 cups/week    Review of Systems: Complete ROS negative except as per HPI.  Physical Exam: There were no vitals taken for this visit. General:   Alert and oriented. Pleasant and cooperative. Well-nourished and well-developed.  Head:  Normocephalic and atraumatic. Eyes:  Without icterus, sclera clear and conjunctiva pink.  Ears:  Normal auditory acuity. Mouth:  No deformity or lesions, oral mucosa pink.  Throat/Neck:  Supple, without mass or thyromegaly. Cardiovascular:  S1, S2 present without murmurs appreciated. Normal pulses noted. Extremities without clubbing or edema. Respiratory:  Clear to auscultation bilaterally. No wheezes, rales, or rhonchi. No distress.  Gastrointestinal:  +BS, soft, non-tender and non-distended. No HSM noted. No guarding or rebound. No masses appreciated.  Rectal:  Deferred  Musculoskalatal:  Symmetrical without gross deformities. Normal posture. Skin:  Intact without significant lesions or rashes. Neurologic:  Alert and oriented x4;  grossly normal neurologically. Psych:  Alert and cooperative. Normal mood and affect. Heme/Lymph/Immune: No significant cervical adenopathy. No excessive bruising noted.    08/23/2017 8:07 AM   Disclaimer: This note was dictated with voice recognition software. Similar sounding words can inadvertently be transcribed and may not be corrected upon review.

## 2017-08-23 NOTE — Telephone Encounter (Signed)
PATIENT WAS A NO SHOW AND LETTER SENT  °

## 2017-08-28 NOTE — Telephone Encounter (Signed)
Noted  

## 2017-09-18 ENCOUNTER — Ambulatory Visit (INDEPENDENT_AMBULATORY_CARE_PROVIDER_SITE_OTHER): Payer: Medicare Other | Admitting: Endocrinology

## 2017-09-18 ENCOUNTER — Encounter: Payer: Self-pay | Admitting: Endocrinology

## 2017-09-18 VITALS — BP 102/66 | HR 87 | Ht 68.0 in | Wt 223.6 lb

## 2017-09-18 DIAGNOSIS — Z794 Long term (current) use of insulin: Secondary | ICD-10-CM

## 2017-09-18 DIAGNOSIS — E1165 Type 2 diabetes mellitus with hyperglycemia: Secondary | ICD-10-CM | POA: Diagnosis not present

## 2017-09-18 LAB — GLUCOSE, POCT (MANUAL RESULT ENTRY): POC GLUCOSE: 317 mg/dL — AB (ref 70–99)

## 2017-09-18 NOTE — Patient Instructions (Addendum)
Check blood sugars on waking up  2/7 daily  Also check blood sugars about 2 hours after a meal and do this after different meals by rotation  Recommended blood sugar levels on waking up is 90-130 and about 2 hours after meal is 130-160  Please bring your blood sugar monitor to each visit, thank you  INSULIN 65 UNITS AM AND 50 BEFORE SUPPER

## 2017-09-18 NOTE — Progress Notes (Signed)
Patient ID: Dustin Estes., male   DOB: 1950-07-08, 67 y.o.   MRN: [id number]          Reason for Appointment: Follow-up for Type 2 Diabetes  Referring physician: Tollie Pizza   History of Present Illness:          Date of diagnosis of type 2 diabetes mellitus:  1990s      Background history:   Patient has little recall about his medication history and has been on various medications over the years for his diabetes Has mostly taken metformin long-term and no injectable drugs His blood sugars have been more significantly out of control in the last year or so with A1c consistently over 9% since 11/17 Not clear if his medications have been changed last year  Recent history:   INSULIN regimen: Novolin mix 65 units before breakfast and 50 before bedtime  Oral hypoglycemic drugs: Metformin 1000 mg, 1 tablet twice a day  His A1c in April was 10.8, previously over 14% in December  Current management, blood sugar patterns and problems identified:  He has reportedly been checking his blood sugar at home but did not bring his meter  However he thinks he is checking blood sugars mostly at bedtime  Despite repeated reminders he takes his evening insulin now at bedtime instead of at suppertime and this causes his blood sugar to be high he checks them at night  Also he now he says that he tends to run out of his insulin before the month is over because of getting only 1 bottle at a time and has not taken any insulin for 3 or 4 days  Blood sugar is 317 in the office today  He appears to have gained weight, not making good choices with food and has not seen the dietitian as directed  His mealtimes are quite irregular and today he did not wake up until noon  Currently not drinking any regular soft drinks or other sweet drinks        Side effects from medications have been: None  Compliance with the medical regimen: Improving  Glucose monitoring:        Glucometer:  One Touch Verio  By  recall blood sugar range at bedtime 175-250  Self-care: The diet that the patient has been following is none  Dietician visit, most recent: 20 years ago  CDE visit: 7/18, 8/18               Exercise:  minimal  Weight history:  Wt Readings from Last 3 Encounters:  09/18/17 223 lb 9.6 oz (101.4 kg)  08/01/17 217 lb 12.8 oz (98.8 kg)  06/01/17 216 lb 3.2 oz (98.1 kg)    Glycemic control:   Lab Results  Component Value Date   HGBA1C 10.8 08/01/2017   HGBA1C 14.7 03/23/2017   HGBA1C 14.1 12/13/2016   Lab Results  Component Value Date   MICROALBUR 2.1 (H) 11/17/2016   CREATININE 1.40 08/01/2017   Lab Results  Component Value Date   MICRALBCREAT 4.4 11/17/2016    Lab Results  Component Value Date   FRUCTOSAMINE 437 (H) 04/20/2017   FRUCTOSAMINE 556 (H) 02/17/2017   FRUCTOSAMINE 525 (H) 11/10/2016       Allergies as of 09/18/2017   No Known Allergies     Medication List        Accurate as of 09/18/17 11:59 PM. Always use your most recent med list.          amLODipine  5 MG tablet Commonly known as:  NORVASC Take 5 mg by mouth daily.   atorvastatin 40 MG tablet Commonly known as:  LIPITOR Take 40 mg by mouth daily.   fenofibrate 160 MG tablet Take 160 mg by mouth once.   Flaxseed (Linseed) 1000 MG Caps Take 1 capsule by mouth 2 (two) times daily.   GLIPIZIDE XL 10 MG 24 hr tablet Generic drug:  glipiZIDE TAKE ONE TABLET BY MOUTH EVERY DAY WITH BREAKFAST   glucose blood test strip Commonly known as:  ACCU-CHEK GUIDE Use to check blood sugar 3 times daily   insulin NPH-regular Human (70-30) 100 UNIT/ML injection Commonly known as:  NOVOLIN 70/30 RELION 35 units in am and 25 at supper   Insulin Pen Needle 31G X 5 MM Misc Use to inject insulin 2 times daily   INSULIN SYRINGE .5CC/30GX5/16" 30G X 5/16" 0.5 ML Misc Use to inject insulin twice daily   metFORMIN 1000 MG tablet Commonly known as:  GLUCOPHAGE Take 1 tablet (1,000 mg total) by mouth  2 (two) times daily.   metoprolol tartrate 25 MG tablet Commonly known as:  LOPRESSOR Take 0.5 tablets (12.5 mg total) by mouth 2 (two) times daily.   multivitamin with minerals Tabs tablet Take 1 tablet by mouth at bedtime. Centrum Silver   omega-3 acid ethyl esters 1 g capsule Commonly known as:  LOVAZA Take 2 capsules (2 g total) by mouth 2 (two) times daily.   pantoprazole 40 MG tablet Commonly known as:  PROTONIX Take 1 tablet (40 mg total) by mouth 2 (two) times daily.   vitamin B-12 500 MCG tablet Commonly known as:  CYANOCOBALAMIN Take 500 mcg by mouth daily.   vitamin B-12 500 MCG tablet Commonly known as:  CYANOCOBALAMIN Take 1 tablet (500 mcg total) by mouth daily.       Allergies: No Known Allergies  Past Medical History:  Diagnosis Date  . 2nd degree AV block 04/29/2011   Medtronic Adapta  . AAA (abdominal aortic aneurysm) ([place]) 12/10/2012  . Abdominal aortic aneurysm ([facility])    4.1 x 4.3 cm  . Diabetes mellitus   . Dysrhythmia   . Encephalomalacia on imaging study   . Hypertension   . Obesity   . OSA (obstructive sleep apnea)   . PAD (peripheral artery disease) ([place])   . Paroxysmal atrial fibrillation ([place]) 03/19/2014   Asymptomatic, pacemaker detected   . Second degree AV block, Mobitz type II 03/19/2014  . SVT (supraventricular tachycardia) ([place]) 04/30/2011  . Tobacco abuse     Past Surgical History:  Procedure Laterality Date  . APPENDECTOMY  12/69  . BLADDER SURGERY  9/04,11/05,9/07   tumor removal  . BRAIN SURGERY    . COLONOSCOPY N/A 09/07/2015   Procedure: COLONOSCOPY;  Surgeon: Danie Binder, MD;  [facility];  Service: Endoscopy;  Laterality: N/A;  2:00 - moved to 1:45 - office to notify  . ESOPHAGOGASTRODUODENOSCOPY N/A 05/22/2015   OIN:OMVEH duodenal ulcer/single ulcer in the gastris antrum/erosive gastritis  . ESOPHAGOGASTRODUODENOSCOPY N/A 09/07/2015   Procedure: ESOPHAGOGASTRODUODENOSCOPY (EGD);  Surgeon: Danie Binder, MD;   [facility];  Service: Endoscopy;  Laterality: N/A;  . LOOP RECORDER EXPLANT  04/29/2011   Procedure: LOOP RECORDER EXPLANT;  Surgeon: Sanda Klein, MD;  Location: [facility];  Service: Cardiovascular;;  . NM MYOCAR PERF WALL MOTION  04/13/2010   moderate, mostly fixed  inferoseptal defect, suspicious for artifact.  Marland Kitchen PERMANENT PACEMAKER INSERTION  04/29/2011  Medtronic Adapta  . PERMANENT PACEMAKER INSERTION N/A 04/29/2011   Procedure: PERMANENT PACEMAKER INSERTION;  Surgeon: Sanda Klein, MD;  Location: [facility];  Service: Cardiovascular;  Laterality: N/A;  . US ECHOCARDIOGRAPHY  04/13/2010   mild asymmetric LVH, EF >55%,mild mitral ca+,AOV mildly sclerotic    Family History  Problem Relation Age of Onset  . Diabetes Mother   . Diabetes Father   . Stroke Father   . Colon cancer Neg Hx     Social History:  reports that he has been smoking cigarettes.  He has a 94.00 pack-year smoking history. He has never used smokeless tobacco. He reports that he drinks alcohol. He reports that he does not use drugs.   Review of Systems   Lipid history: Has high triglycerides persistently  Probably taking his gemfibrozil recently but now taking fenofibrate along with the Lovaza Also supposed to be on Lipitor    Lab Results  Component Value Date   CHOL 134 08/01/2017   HDL 17.40 (L) 08/01/2017   LDLDIRECT 59.0 08/01/2017   TRIG (H) 08/01/2017    580.0 Triglyceride is over 400; calculations on Lipids are invalid.   CHOLHDL 8 08/01/2017           Hypertension: Mild and treated with amlodipine for several years, followed by other physicians Blood pressure is lower but he does not complain of lightheadedness  Quinapril was previously stopped when he had mild hyperkalemia  RENAL dysfunction:Followed by nephrology, recently stable  Lab Results  Component Value Date   CREATININE 1.40 08/01/2017   CREATININE 1.43 06/01/2017   CREATININE 1.79 (H) 04/20/2017     Most  recent eye exam was 6/18, reportedly no diabetic retinopathy, has history of cataract surgery  Most recent foot exam: 6/18    LABS:  Office Visit on 09/18/2017  Component Date Value Ref Range Status  . POC Glucose 09/18/2017 317* 70 - 99 mg/dl Final    Physical Examination:  BP 102/66   Pulse 87   Ht 5\' 8"  (1.727 m)   Wt 223 lb 9.6 oz (101.4 kg)   SpO2 99%   BMI 34.00 kg/m     Patient was checked sitting and standing No peripheral edema present    ASSESSMENT:  Diabetes type 2, uncontrolled  See history of present illness for  discussion of current diabetes management, blood sugar patterns and problems identified  His A1c is  over 10% in April appears to still have high readings although has minimal monitoring at home  He is not taking his suppertime insulin at bedtime and this is causing higher readings after supper at least Also having difficulty getting full supply of his insulin per month and recently off insulin 3 days   RENAL dysfunction: Needs to be rechecked periodically,  ?  Hypertension: His blood pressure is low normal and apparently only on amlodipine 5 mg  PLAN:    To bring his monitor for download on each visit  He needs to check blood sugars at various times not not just at bedtime, do some readings in the morning  Also increased dosage for his insulin vial prescription will be done he can continue to use the [employer] brand  May consider using Xultophy for better efficacy if we can get patient assistance program, would consider this on the next visit  He will make sure he takes his evening dose at suppertime and not bedtime  AMLODIPINE will be reduced to half a tablet, to take 2.5 mg only, may  continue metoprolol   Patient Instructions  Check blood sugars on waking up  2/7 daily  Also check blood sugars about 2 hours after a meal and do this after different meals by rotation  Recommended blood sugar levels on waking up is 90-130 and about 2  hours after meal is 130-160  Please bring your blood sugar monitor to each visit, thank you  INSULIN 65 UNITS AM AND 50 BEFORE SUPPER     Elayne Snare 09/19/2017, 10:07 AM   Note: This office note was prepared with Dragon voice recognition system technology. Any transcriptional errors that result from this process are unintentional.

## 2017-09-20 ENCOUNTER — Ambulatory Visit: Payer: Medicare Other | Admitting: Internal Medicine

## 2017-09-21 ENCOUNTER — Encounter: Payer: Self-pay | Admitting: Internal Medicine

## 2017-09-23 LAB — HM DIABETES EYE EXAM

## 2017-10-18 ENCOUNTER — Other Ambulatory Visit: Payer: Self-pay | Admitting: Endocrinology

## 2017-10-24 LAB — HM DIABETES EYE EXAM

## 2017-11-16 ENCOUNTER — Encounter: Payer: Self-pay | Admitting: Endocrinology

## 2017-11-16 ENCOUNTER — Ambulatory Visit (INDEPENDENT_AMBULATORY_CARE_PROVIDER_SITE_OTHER): Payer: Medicare Other | Admitting: Endocrinology

## 2017-11-16 VITALS — BP 138/78 | HR 94 | Ht 68.0 in | Wt 221.6 lb

## 2017-11-16 DIAGNOSIS — Z794 Long term (current) use of insulin: Secondary | ICD-10-CM

## 2017-11-16 DIAGNOSIS — E1165 Type 2 diabetes mellitus with hyperglycemia: Secondary | ICD-10-CM

## 2017-11-16 LAB — POCT GLYCOSYLATED HEMOGLOBIN (HGB A1C): Hemoglobin A1C: 13.9 % — AB (ref 4.0–5.6)

## 2017-11-16 LAB — COMPREHENSIVE METABOLIC PANEL
ALBUMIN: 3.9 g/dL (ref 3.5–5.2)
ALK PHOS: 120 U/L — AB (ref 39–117)
ALT: 47 U/L (ref 0–53)
AST: 51 U/L — AB (ref 0–37)
BUN: 21 mg/dL (ref 6–23)
CO2: 32 mEq/L (ref 19–32)
CREATININE: 1.74 mg/dL — AB (ref 0.40–1.50)
Calcium: 9.4 mg/dL (ref 8.4–10.5)
Chloride: 95 mEq/L — ABNORMAL LOW (ref 96–112)
GFR: 41.67 mL/min — ABNORMAL LOW (ref >60.00)
GLUCOSE: 457 mg/dL — AB (ref 70–99)
POTASSIUM: 4.1 meq/L (ref 3.5–5.1)
SODIUM: 135 meq/L (ref 135–145)
TOTAL PROTEIN: 7.9 g/dL (ref 6.0–8.3)
Total Bilirubin: 0.3 mg/dL (ref 0.2–1.2)

## 2017-11-16 LAB — LIPID PANEL
CHOL/HDL RATIO: 8
Cholesterol: 182 mg/dL (ref 0–200)
HDL: 21.5 mg/dL — ABNORMAL LOW (ref >39.00)

## 2017-11-16 LAB — LDL CHOLESTEROL, DIRECT: Direct LDL: 35 mg/dL

## 2017-11-16 LAB — GLUCOSE, POCT (MANUAL RESULT ENTRY): POC Glucose: 468 mg/dl — AB (ref 70–99)

## 2017-11-16 MED ORDER — FENOFIBRATE 160 MG PO TABS: 160.0000 mg | ORAL_TABLET | Freq: Once | ORAL | 0 refills | Status: AC

## 2017-11-16 MED ORDER — OMEGA-3-ACID ETHYL ESTERS 1 G PO CAPS: 2.0000 g | ORAL_CAPSULE | Freq: Two times a day (BID) | ORAL | 2 refills | Status: DC

## 2017-11-16 NOTE — Progress Notes (Signed)
Patient ID: Dustin Estes., male   DOB: 07/02/1950, 67 y.o.   MRN: [id number]          Reason for Appointment: Follow-up for Type 2 Diabetes  Referring physician: Tollie Pizza   History of Present Illness:          Date of diagnosis of type 2 diabetes mellitus:  1990s      Background history:   Patient has little recall about his medication history and has been on various medications over the years for his diabetes Has mostly taken metformin long-term and no injectable drugs His blood sugars have been more significantly out of control in the last year or so with A1c consistently over 9% since 11/17 Not clear if his medications have been changed last year  Recent history:   INSULIN regimen: Novolin mix 65 units before breakfast and 50 units in the evening  Oral hypoglycemic drugs: Metformin 1000 mg, 1 tablet twice a day  His A1c in April was 10.8 and now at 13.9  Current management, blood sugar patterns and problems identified:  He has no blood sugar readings on his meter since March  He says he has difficulty checking his blood sugar with the device he is using but he is not using the actual lancet device that comes with the meter and this was shown to him today  He cannot explain why his blood sugar is well over 400 today even without any food and having taken his normal insulin dose this morning  He also think that he is taking his insulin doses consistently although sometimes will take it after eating at night  Not clear why he has been put on glipizide and Januvia by his PCP, he has not had any improvement with these.  Also not taking Januvia since it is too expensive  His mealtimes are quite irregular as before  However he took his insulin this morning and without eating any food  Currently not drinking any regular soft drinks or other sweet drinks        Side effects from medications have been: None  Compliance with the medical regimen: poor  Glucose monitoring:  None recently      Glucometer:  One Touch Verio   Self-care: The diet that the patient has been following is none  Dietician visit, most recent: 20 years ago  CDE visit: 7/18, 8/18               Exercise:  minimal  Weight history:  Wt Readings from Last 3 Encounters:  11/16/17 221 lb 9.6 oz (100.5 kg)  09/18/17 223 lb 9.6 oz (101.4 kg)  08/01/17 217 lb 12.8 oz (98.8 kg)    Glycemic control:   Lab Results  Component Value Date   HGBA1C 13.9 (A) 11/16/2017   HGBA1C 10.8 08/01/2017   HGBA1C 14.7 03/23/2017   Lab Results  Component Value Date   MICROALBUR 2.1 (H) 11/17/2016   CREATININE 1.40 08/01/2017   Lab Results  Component Value Date   MICRALBCREAT 4.4 11/17/2016    Lab Results  Component Value Date   FRUCTOSAMINE 437 (H) 04/20/2017   FRUCTOSAMINE 556 (H) 02/17/2017   FRUCTOSAMINE 525 (H) 11/10/2016       Allergies as of 11/16/2017   No Known Allergies     Medication List        Accurate as of 11/16/17  2:09 PM. Always use your most recent med list.  amLODipine 5 MG tablet Commonly known as:  NORVASC Take 5 mg by mouth daily.   atorvastatin 40 MG tablet Commonly known as:  LIPITOR Take 40 mg by mouth daily.   fenofibrate 160 MG tablet Take 160 mg by mouth once.   Flaxseed (Linseed) 1000 MG Caps Take 1 capsule by mouth 2 (two) times daily.   glucose blood test strip Commonly known as:  ACCU-CHEK GUIDE Use to check blood sugar 3 times daily   insulin NPH-regular Human (70-30) 100 UNIT/ML injection Commonly known as:  NOVOLIN 70/30 RELION 35 units in am and 25 at supper   Insulin Pen Needle 31G X 5 MM Misc Use to inject insulin 2 times daily   INSULIN SYRINGE .5CC/30GX5/16" 30G X 5/16" 0.5 ML Misc Use to inject insulin twice daily   metFORMIN 1000 MG tablet Commonly known as:  GLUCOPHAGE Take 1 tablet (1,000 mg total) by mouth 2 (two) times daily.   metoprolol tartrate 25 MG tablet Commonly known as:  LOPRESSOR Take 0.5  tablets (12.5 mg total) by mouth 2 (two) times daily.   multivitamin with minerals Tabs tablet Take 1 tablet by mouth at bedtime. Centrum Silver   omega-3 acid ethyl esters 1 g capsule Commonly known as:  LOVAZA Take 2 capsules (2 g total) by mouth 2 (two) times daily.   pantoprazole 40 MG tablet Commonly known as:  PROTONIX Take 1 tablet (40 mg total) by mouth 2 (two) times daily.   vitamin B-12 500 MCG tablet Commonly known as:  CYANOCOBALAMIN Take 500 mcg by mouth daily.   vitamin B-12 500 MCG tablet Commonly known as:  CYANOCOBALAMIN Take 1 tablet (500 mcg total) by mouth daily.       Allergies: No Known Allergies  Past Medical History:  Diagnosis Date  . 2nd degree AV block 04/29/2011   Medtronic Adapta  . AAA (abdominal aortic aneurysm) ([place]) 12/10/2012  . Abdominal aortic aneurysm ([facility])    4.1 x 4.3 cm  . Diabetes mellitus   . Dysrhythmia   . Encephalomalacia on imaging study   . Hypertension   . Obesity   . OSA (obstructive sleep apnea)   . PAD (peripheral artery disease) ([place])   . Paroxysmal atrial fibrillation ([place]) 03/19/2014   Asymptomatic, pacemaker detected   . Second degree AV block, Mobitz type II 03/19/2014  . SVT (supraventricular tachycardia) ([place]) 04/30/2011  . Tobacco abuse     Past Surgical History:  Procedure Laterality Date  . APPENDECTOMY  12/69  . BLADDER SURGERY  9/04,11/05,9/07   tumor removal  . BRAIN SURGERY    . COLONOSCOPY N/A 09/07/2015   Procedure: COLONOSCOPY;  Surgeon: Danie Binder, MD;  [facility];  Service: Endoscopy;  Laterality: N/A;  2:00 - moved to 1:45 - office to notify  . ESOPHAGOGASTRODUODENOSCOPY N/A 05/22/2015   [id number]:[id number] duodenal ulcer/single ulcer in the gastris antrum/erosive gastritis  . ESOPHAGOGASTRODUODENOSCOPY N/A 09/07/2015   Procedure: ESOPHAGOGASTRODUODENOSCOPY (EGD);  Surgeon: Danie Binder, MD;  [facility];  Service: Endoscopy;  Laterality: N/A;  . LOOP RECORDER EXPLANT   04/29/2011   Procedure: LOOP RECORDER EXPLANT;  Surgeon: Sanda Klein, MD;  Location: [facility] CATH LAB;  Service: Cardiovascular;;  . NM MYOCAR PERF WALL MOTION  04/13/2010   moderate, mostly fixed  inferoseptal defect, suspicious for artifact.  Marland Kitchen PERMANENT PACEMAKER INSERTION  04/29/2011   Medtronic Adapta  . PERMANENT PACEMAKER INSERTION N/A 04/29/2011   Procedure: PERMANENT PACEMAKER INSERTION;  Surgeon: Sanda Klein, MD;  Location: [place] [facility] CATH LAB;  Service: Cardiovascular;  Laterality: N/A;  . US ECHOCARDIOGRAPHY  04/13/2010   mild asymmetric LVH, EF >55%,mild mitral ca+,AOV mildly sclerotic    Family History  Problem Relation Age of Onset  . Diabetes Mother   . Diabetes Father   . Stroke Father   . Colon cancer Neg Hx     Social History:  reports that he has been smoking cigarettes.  He has a 94.00 pack-year smoking history. He has never used smokeless tobacco. He reports that he drinks alcohol. He reports that he does not use drugs.   Review of Systems   Lipid history: Has high triglycerides persistently  Probably taking his gemfibrozil recently but now taking fenofibrate along with the Lovaza However has not had a refill on his Lovaza recently Also supposed to be on Lipitor    Lab Results  Component Value Date   CHOL 134 08/01/2017   HDL 17.40 (L) 08/01/2017   LDLDIRECT 59.0 08/01/2017   TRIG (H) 08/01/2017    580.0 Triglyceride is over 400; calculations on Lipids are invalid.   CHOLHDL 8 08/01/2017           Hypertension: Mild and treated with amlodipine for several years, followed by other physicians Blood pressure is not as low He says he is taking amlodipine half tablet twice a day even though he was told to take only half tablet once a day  Quinapril was previously stopped when he had mild hyperkalemia  RENAL dysfunction:Followed by nephrology, previously stable  Lab Results  Component Value Date   CREATININE 1.40 08/01/2017   CREATININE 1.43 06/01/2017    CREATININE 1.79 (H) 04/20/2017     Most recent eye exam was 6/18, reportedly no diabetic retinopathy, has history of cataract surgery  Most recent foot exam: 6/18    LABS:  Office Visit on 11/16/2017  Component Date Value Ref Range Status  . Hemoglobin A1C 11/16/2017 13.9* 4.0 - 5.6 % Final  . POC Glucose 11/16/2017 468* 70 - 99 mg/dl Final    Physical Examination:  BP 138/78 (BP Location: Left Arm, Patient Position: Sitting, Cuff Size: Normal)   Pulse 94   Ht 5\' 8"  (1.727 m)   Wt 221 lb 9.6 oz (100.5 kg)   SpO2 100%   BMI 33.69 kg/m       ASSESSMENT:  Diabetes type 2, uncontrolled  See history of present illness for  discussion of current diabetes management, blood sugar patterns and problems identified  His A1c is now 13.9, previously was over 10% in April  This is with his reportedly taking his insulin twice a day Not clear why his blood sugars are getting higher This is the same insulin dose Also cannot explain why his blood sugar is over 400 today even without any recent use of steroids, avoiding regular soft drinks and taking insulin reportedly twice a day consistently  He has been tried on glipizide by his PCP but this is ineffective Also not benefiting much from metformin currently Because of his variable renal function and cost he cannot take Invokana or a GLP-1 drug  Also difficult to assess his blood sugar because of lack of monitoring Showed him how to use the lancet device that comes with his Verio monitor and he thinks he can start checking his sugar again which she has not done since March  RENAL dysfunction: Needs to be rechecked today,  Hypertension: Blood pressure controlled on amlodipine  High triglycerides: We will recheck today since  he is fasting  PLAN:    Stop glipizide  Do not need to fill Januvia  Insulin 80 units before breakfast and suppertime and make sure that this is taken 15 minutes before eating  Check blood sugars at  least once or twice a day at various times  Call if blood sugars are persistently high  Follow-up with diabetes educator in 3 weeks  New prescription will be sent for Lovaza and fenofibrate, unlikely that he is taking his Lovaza consistently for his hypertriglyceridemia   Patient Instructions  Stop Glipizide and do not need januvia  INSULIN 80 UNITS 15 minutes before breakfast and 15 before suppertime   Counseling time on subjects discussed in assessment and plan sections is over 50% of today's 25 minute visit    Elayne Snare 11/16/2017, 2:09 PM   Note: This office note was prepared with Dragon voice recognition system technology. Any transcriptional errors that result from this process are unintentional.

## 2017-11-16 NOTE — Patient Instructions (Addendum)
Stop Glipizide and do not need januvia  INSULIN 80 UNITS 15 minutes before breakfast and 15 before suppertime

## 2017-11-30 ENCOUNTER — Ambulatory Visit (HOSPITAL_COMMUNITY)
Admission: RE | Admit: 2017-11-30 | Payer: Medicare Other | Source: Ambulatory Visit | Attending: Internal Medicine | Admitting: Internal Medicine

## 2017-12-12 ENCOUNTER — Ambulatory Visit (HOSPITAL_COMMUNITY)
Admission: RE | Admit: 2017-12-12 | Discharge: 2017-12-12 | Disposition: A | Discharge status: Home / Self Care | Payer: Medicare Other | Source: Ambulatory Visit | Attending: Cardiology | Admitting: Cardiology

## 2017-12-12 ENCOUNTER — Ambulatory Visit (HOSPITAL_COMMUNITY)
Admission: RE | Admit: 2017-12-12 | Payer: Medicare Other | Source: Ambulatory Visit | Attending: Internal Medicine | Admitting: Internal Medicine

## 2017-12-12 ENCOUNTER — Ambulatory Visit (INDEPENDENT_AMBULATORY_CARE_PROVIDER_SITE_OTHER): Payer: Medicare Other | Admitting: Internal Medicine

## 2017-12-12 ENCOUNTER — Encounter: Payer: Self-pay | Admitting: Internal Medicine

## 2017-12-12 VITALS — BP 102/60 | HR 93 | Ht 68.0 in | Wt 219.0 lb

## 2017-12-12 DIAGNOSIS — I714 Abdominal aortic aneurysm, without rupture, unspecified: Secondary | ICD-10-CM

## 2017-12-12 DIAGNOSIS — G4733 Obstructive sleep apnea (adult) (pediatric): Secondary | ICD-10-CM | POA: Diagnosis not present

## 2017-12-12 DIAGNOSIS — Z950 Presence of cardiac pacemaker: Secondary | ICD-10-CM

## 2017-12-12 DIAGNOSIS — I480 Paroxysmal atrial fibrillation: Secondary | ICD-10-CM | POA: Diagnosis not present

## 2017-12-12 DIAGNOSIS — I739 Peripheral vascular disease, unspecified: Secondary | ICD-10-CM

## 2017-12-12 NOTE — Patient Instructions (Signed)
Your physician has recommended you make the following change in your medication -- START vascepa 2 gram twice daily   -- this will be in place of lovaza  -- do not stop medication until your vascepa is approved/it is affordable  Your physician wants you to follow-up in: ONE YEAR with Dr. Debara Pickett. You will receive a reminder letter in the mail two months in advance. If you don't receive a letter, please call our office to schedule the follow-up appointment.

## 2017-12-12 NOTE — Progress Notes (Signed)
OFFICE NOTE  Chief Complaint:  No complaints  Primary Care Physician: [facility], [facility]  HPI:  Dustin Widen. is a 67 year old gentleman with a history of unexplained syncope, ultimately found to have high-degree AV block with a loop recorder. He underwent explantation and permanent pacemaker placement in January of 2013 and has done fairly well since then and has had no further syncopal events. He also has an abdominal aortic aneurysm which was recently reassessed by ultrasound and is fairly stable, measuring 4.1 x 4.3 cm. Unfortunately he has bilateral lower extremity arterial disease with SFA narrowing of 50-69%. His right ABI is 0.84, his left ABI is 0.76. At this point he is not having any symptoms of claudication, however, we recommended we repeat the studies in 1 year. Unfortunately he also has had continued tobacco dependence and smoking now 3 packs a day. We discussed that this probably amounts to about $100 a week, or $400 a month, which is approximately one-third of his monthly Social Security check. Despite that he continues to smoke. Finally his blood pressure was noted to be low today, measuring 80/56 and a third recheck indicated it was about 85/56.  At his last visit I decreased his blood pressure medications and he reported his blood pressure is now better. He also saw Dr. Sallyanne Kuster recently who felt like his pacemaker was working appropriately.  Dustin Estes returns today for followup. He had recent abdominal Dopplers which show a small increase in his abdominal aortic aneurysm to 4.5 by 4.6 cm. he's had slight worsening in his peripheral Dopplers however denies any claudication. He's had no further syncopal episodes since he had a pacemaker placed. Unfortunately continues to smoke. Blood pressure is well controlled.  08/28/2015  Dustin Estes returns today for follow-up. He reports no new symptoms. He last had his pacemaker checked last summer. He was enrolled in a remote  device clinic but unfortunately he got a new device and says he does not have a landline, therefore he is not on any remote transmissions over the past year. He's not scheduled to see Dr. Loletha Grayer back in the pacemaker clinic until August. He denies any palpitations or racing heart. He said no further syncopal episodes. We recently did repeat ultrasounds of his abdominal aortic aneurysm which measures less than 4.5 cm. Lower extremity arterial Dopplers are stable with a ABI of 0.8 bilaterally. Blood pressure is well-controlled today 130/60. He's managed to lose about 6 pounds of weight.  10/14/2016  I saw Dustin Estes today in follow-up. He denies any recurrent syncope. He is overdue for check with Dr. Loletha Grayer for his pacemaker. He says he cannot get his remote checks to work. He has been followed for an abdominal aortic aneurysm which is been stable. He is Dopplers every 6 months. He denies any claudication. Blood pressure is at goal today. Weight is down another 5 pounds. He does appear somewhat disheveled today. He smells like he has not bathed in a while. He says the cares for himself and that his brother checks in on him. 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 He says he sometimes cooks for himself.  12/12/2017  Dustin Estes is seen today in follow-up.  Recently saw his endocrinologist and was noted to have severe hypertriglyceridemia with triglycerides over 1100.  Blood glucose is not well controlled.  His insulin was increased further.  He was started on fenofibrate and Lovaza.  He denies any chest pain or  further syncopal events.  He is overdue for surveillance of an abdominal aortic aneurysm.  He scheduled for ultrasound today.  He is followed in our pacemaker clinic with Dr. Sallyanne Kuster.  PMHx:  Past Medical History:  Diagnosis Date  . 2nd degree AV block 04/29/2011   Medtronic Adapta  . AAA (abdominal aortic aneurysm) ([place]) 12/10/2012  . Abdominal aortic aneurysm ([facility])    4.1 x 4.3 cm    . Diabetes mellitus   . Dysrhythmia   . Encephalomalacia on imaging study   . Hypertension   . Obesity   . OSA (obstructive sleep apnea)   . PAD (peripheral artery disease) ([place])   . Paroxysmal atrial fibrillation ([place]) 03/19/2014   Asymptomatic, pacemaker detected   . Second degree AV block, Mobitz type II 03/19/2014  . SVT (supraventricular tachycardia) ([place]) 04/30/2011  . Tobacco abuse     Past Surgical History:  Procedure Laterality Date  . APPENDECTOMY  12/69  . BLADDER SURGERY  9/04,11/05,9/07   tumor removal  . BRAIN SURGERY    . COLONOSCOPY N/A 09/07/2015   Procedure: COLONOSCOPY;  Surgeon: Danie Binder, MD;  [facility];  Service: Endoscopy;  Laterality: N/A;  2:00 - moved to 1:45 - office to notify  . ESOPHAGOGASTRODUODENOSCOPY N/A 05/22/2015   WCB:JSEGB duodenal ulcer/single ulcer in the gastris antrum/erosive gastritis  . ESOPHAGOGASTRODUODENOSCOPY N/A 09/07/2015   Procedure: ESOPHAGOGASTRODUODENOSCOPY (EGD);  Surgeon: Danie Binder, MD;  [facility];  Service: Endoscopy;  Laterality: N/A;  . LOOP RECORDER EXPLANT  04/29/2011   Procedure: LOOP RECORDER EXPLANT;  Surgeon: Sanda Klein, MD;  Location: [facility] CATH LAB;  Service: Cardiovascular;;  . NM MYOCAR PERF WALL MOTION  04/13/2010   moderate, mostly fixed  inferoseptal defect, suspicious for artifact.  Marland Kitchen PERMANENT PACEMAKER INSERTION  04/29/2011   Medtronic Adapta  . PERMANENT PACEMAKER INSERTION N/A 04/29/2011   Procedure: PERMANENT PACEMAKER INSERTION;  Surgeon: Sanda Klein, MD;  Location: [facility] CATH LAB;  Service: Cardiovascular;  Laterality: N/A;  . US ECHOCARDIOGRAPHY  04/13/2010   mild asymmetric LVH, EF >55%,mild mitral ca+,AOV mildly sclerotic    FAMHx:  Family History  Problem Relation Age of Onset  . Diabetes Mother   . Diabetes Father   . Stroke Father   . Colon cancer Neg Hx     SOCHx:   reports that he has been smoking cigarettes. He has a 94.00 pack-year smoking history. He  has never used smokeless tobacco. He reports that he drinks alcohol. He reports that he does not use drugs.  ALLERGIES:  No Known Allergies  ROS: Pertinent items noted in HPI and remainder of comprehensive ROS otherwise negative.  HOME MEDS: Current Outpatient Medications  Medication Sig Dispense Refill  . amLODipine (NORVASC) 5 MG tablet Take 5 mg by mouth daily.    Marland Kitchen atorvastatin (LIPITOR) 40 MG tablet Take 40 mg by mouth daily.    . cyanocobalamin 500 MCG tablet Take 1 tablet (500 mcg total) by mouth daily. 30 tablet 2  . fenofibrate 160 MG tablet Take 1 tablet (160 mg total) by mouth once for 1 dose. 1 tablet 0  . Flaxseed, Linseed, 1000 MG CAPS Take 1 capsule by mouth 2 (two) times daily.     Marland Kitchen glucose blood (ACCU-CHEK GUIDE) test strip Use to check blood sugar 3 times daily 100 each 12  . insulin NPH-regular Human (NOVOLIN 70/30 RELION) (70-30) 100 UNIT/ML injection 35 units in am and 25 at supper (Patient taking differently: 40 units  in am and 40 at supper) 10 mL 11  . Insulin Pen Needle 31G X 5 MM MISC Use to inject insulin 2 times daily 90 each 4  . Insulin Syringe-Needle U-100 (INSULIN SYRINGE .5CC/30GX5/16") 30G X 5/16" 0.5 ML MISC Use to inject insulin twice daily 90 each 3  . metFORMIN (GLUCOPHAGE) 1000 MG tablet Take 1 tablet (1,000 mg total) by mouth 2 (two) times daily. 180 tablet 2  . metoprolol tartrate (LOPRESSOR) 25 MG tablet Take 0.5 tablets (12.5 mg total) by mouth 2 (two) times daily. 60 tablet 2  . Multiple Vitamin (MULITIVITAMIN WITH MINERALS) TABS Take 1 tablet by mouth at bedtime. Centrum Silver    . omega-3 acid ethyl esters (LOVAZA) 1 g capsule Take 2 capsules (2 g total) by mouth 2 (two) times daily. 120 capsule 2  . pantoprazole (PROTONIX) 40 MG tablet Take 1 tablet (40 mg total) by mouth 2 (two) times daily. 60 tablet 2  . vitamin B-12 (CYANOCOBALAMIN) 500 MCG tablet Take 500 mcg by mouth daily.     No current facility-administered medications for this  visit.     LABS/IMAGING: No results found for this or any previous visit (from the past 48 hour(s)). No results found.  VITALS: BP 102/60 (BP Location: Left Arm, Patient Position: Sitting, Cuff Size: Normal)   Pulse 93   Ht 5\' 8"  (1.727 m)   Wt 219 lb (99.3 kg)   BMI 33.30 kg/m   EXAM: General appearance: alert, no distress and moderately obese Neck: no carotid bruit, no JVD and thyroid not enlarged, symmetric, no tenderness/mass/nodules Lungs: clear to auscultation bilaterally Heart: regular rate and rhythm Abdomen: soft, non-tender; bowel sounds normal; no masses,  no organomegaly Extremities: extremities normal, atraumatic, no cyanosis or edema and missing 1/2 finger on right hand Pulses: 2+ and symmetric Skin: Skin color, texture, turgor normal. No rashes or lesions Neurologic: Grossly normal Psych: Desheveled appearing, unkempt  EKG: A sensed, V paced rhythm at 93-personally reviewed  ASSESSMENT: 1. Syncope status post dual-chamber pacemaker 2. Obstructive sleep apnea 3. Obesity 4. Diabetes type 2 5. Dyslipidemia 6. Hypertension 7. PAD - Bilateral ABI's at 0.8 8. Stable aortic aneurysm measuring <4.5 cm 9. Tobacco dependence  PLAN: 1.   Dustin Estes has severe hypertriglyceridemia.  Is probably related to combination of medication noncompliance, poorly controlled diabetes and other factors.  He denies alcohol use.  I agree with the plan for fenofibrate, but would advise using Vascepa over Lovaza as data clearly shows that in patients with cardiovascular disease, event reduction is noted with Vascepa as per the reduce-IT study compared to Lovaza or over-the-counter fish oil.  Cost may be an issue since he is on Medicare but we will certainly try to get authorization for this.  We will go ahead and get abdominal aortic ultrasound today.  His aneurysm recently was measured as high as 4.8 cm.  Follow-up annually or sooner as necessary.  Pixie Casino, MD, [facility], [place] Director of the [facility] Lipid [facility]  Cardiovascular Risk Reduction Clinic Diplomate of the American Board of Clinical Lipidology Attending Cardiologist  Direct Dial: [phone]  Fax: [phone]  Website:  [email or web address] 12/12/2017, 1:36 PM

## 2017-12-14 MED ORDER — ICOSAPENT ETHYL 1 G PO CAPS: 2.0000 g | ORAL_CAPSULE | Freq: Two times a day (BID) | ORAL | 11 refills | Status: AC

## 2017-12-14 NOTE — Addendum Note (Signed)
Addended by: Fidel Levy on: 12/14/2017 02:08 PM   Modules accepted: Orders

## 2017-12-15 ENCOUNTER — Other Ambulatory Visit: Payer: Self-pay | Admitting: *Deleted

## 2017-12-15 DIAGNOSIS — I714 Abdominal aortic aneurysm, without rupture, unspecified: Secondary | ICD-10-CM

## 2017-12-19 ENCOUNTER — Encounter: Payer: Self-pay | Admitting: Nutrition

## 2017-12-21 ENCOUNTER — Telehealth: Payer: Self-pay | Admitting: Internal Medicine

## 2017-12-21 NOTE — Telephone Encounter (Signed)
Called pharmacy staff to inquire about status of vascepa as no prior auth was required. Per staff, patient picked up Rx yesterday and it was $77.53 for a 30 day supply. Med list updated - lovaza removed per Dr. Lysbeth Penner instructions at last visit.

## 2018-01-16 ENCOUNTER — Telehealth: Payer: Self-pay | Admitting: Endocrinology

## 2018-01-16 ENCOUNTER — Ambulatory Visit: Payer: Self-pay | Admitting: Endocrinology

## 2018-01-16 NOTE — Telephone Encounter (Signed)
Pt. passed on 01/23/23.

## 2018-01-16 DEATH — deceased

## 2018-02-09 ENCOUNTER — Encounter: Payer: Self-pay | Admitting: Vascular Surgery

## 2018-02-09 ENCOUNTER — Encounter: Payer: Medicare Other | Admitting: Vascular Surgery

## 2018-02-12 ENCOUNTER — Encounter: Payer: Self-pay | Admitting: Vascular Surgery
